# Patient Record
Sex: Male | Born: 1935
Health system: Southern US, Community
[De-identification: ages and names within clinical notes are randomized; demographics above are authoritative.]

## PROBLEM LIST (undated history)

## (undated) DIAGNOSIS — F0390 Unspecified dementia without behavioral disturbance: Secondary | ICD-10-CM

## (undated) DIAGNOSIS — I2510 Atherosclerotic heart disease of native coronary artery without angina pectoris: Secondary | ICD-10-CM

## (undated) DIAGNOSIS — R9431 Abnormal electrocardiogram [ECG] [EKG]: Secondary | ICD-10-CM

## (undated) DIAGNOSIS — E538 Deficiency of other specified B group vitamins: Secondary | ICD-10-CM

## (undated) DIAGNOSIS — I429 Cardiomyopathy, unspecified: Secondary | ICD-10-CM

## (undated) DIAGNOSIS — E119 Type 2 diabetes mellitus without complications: Secondary | ICD-10-CM

## (undated) DIAGNOSIS — I10 Essential (primary) hypertension: Secondary | ICD-10-CM

## (undated) HISTORY — PX: HIP SURGERY: SHX245

## (undated) HISTORY — PX: KNEE SURGERY: SHX244

## (undated) HISTORY — DX: Cardiomyopathy, unspecified: I42.9

## (undated) HISTORY — DX: Abnormal electrocardiogram (ECG) (EKG): R94.31

## (undated) HISTORY — DX: Essential (primary) hypertension: I10

## (undated) HISTORY — DX: Unspecified dementia, unspecified severity, without behavioral disturbance, psychotic disturbance, mood disturbance, and anxiety: F03.90

## (undated) HISTORY — PX: PROSTATE SURGERY: SHX751

## (undated) HISTORY — PX: HERNIA REPAIR: SHX51

## (undated) HISTORY — DX: Type 2 diabetes mellitus without complications: E11.9

## (undated) HISTORY — DX: Atherosclerotic heart disease of native coronary artery without angina pectoris: I25.10

---

## 2011-06-08 ENCOUNTER — Ambulatory Visit (INDEPENDENT_AMBULATORY_CARE_PROVIDER_SITE_OTHER): Payer: Medicare Other | Admitting: Urology

## 2011-06-08 DIAGNOSIS — R82998 Other abnormal findings in urine: Secondary | ICD-10-CM

## 2011-06-08 DIAGNOSIS — N320 Bladder-neck obstruction: Secondary | ICD-10-CM

## 2012-01-31 DIAGNOSIS — R498 Other voice and resonance disorders: Secondary | ICD-10-CM | POA: Diagnosis not present

## 2012-01-31 DIAGNOSIS — R229 Localized swelling, mass and lump, unspecified: Secondary | ICD-10-CM | POA: Diagnosis not present

## 2012-01-31 DIAGNOSIS — M1990 Unspecified osteoarthritis, unspecified site: Secondary | ICD-10-CM | POA: Diagnosis not present

## 2012-03-06 DIAGNOSIS — R5383 Other fatigue: Secondary | ICD-10-CM | POA: Diagnosis not present

## 2012-03-06 DIAGNOSIS — Z79899 Other long term (current) drug therapy: Secondary | ICD-10-CM | POA: Diagnosis not present

## 2012-03-06 DIAGNOSIS — IMO0002 Reserved for concepts with insufficient information to code with codable children: Secondary | ICD-10-CM | POA: Diagnosis not present

## 2012-03-06 DIAGNOSIS — E7800 Pure hypercholesterolemia, unspecified: Secondary | ICD-10-CM | POA: Diagnosis not present

## 2012-03-06 DIAGNOSIS — M7989 Other specified soft tissue disorders: Secondary | ICD-10-CM | POA: Diagnosis not present

## 2012-03-06 DIAGNOSIS — R233 Spontaneous ecchymoses: Secondary | ICD-10-CM | POA: Diagnosis not present

## 2012-03-08 DIAGNOSIS — Z0000 Encounter for general adult medical examination without abnormal findings: Secondary | ICD-10-CM | POA: Diagnosis not present

## 2012-03-08 DIAGNOSIS — E119 Type 2 diabetes mellitus without complications: Secondary | ICD-10-CM | POA: Diagnosis not present

## 2012-03-15 DIAGNOSIS — L03039 Cellulitis of unspecified toe: Secondary | ICD-10-CM | POA: Diagnosis not present

## 2012-04-04 DIAGNOSIS — M109 Gout, unspecified: Secondary | ICD-10-CM | POA: Diagnosis not present

## 2012-06-12 DIAGNOSIS — I10 Essential (primary) hypertension: Secondary | ICD-10-CM | POA: Diagnosis not present

## 2012-07-07 ENCOUNTER — Encounter (HOSPITAL_COMMUNITY): Payer: Self-pay | Admitting: Emergency Medicine

## 2012-07-07 ENCOUNTER — Observation Stay (HOSPITAL_COMMUNITY)
Admission: EM | Admit: 2012-07-07 | Discharge: 2012-07-08 | Disposition: A | Discharge status: Home / Self Care | Payer: Medicare Other | Attending: Internal Medicine | Admitting: Internal Medicine

## 2012-07-07 ENCOUNTER — Emergency Department (HOSPITAL_COMMUNITY): Payer: Medicare Other

## 2012-07-07 DIAGNOSIS — R9431 Abnormal electrocardiogram [ECG] [EKG]: Secondary | ICD-10-CM | POA: Diagnosis present

## 2012-07-07 DIAGNOSIS — I10 Essential (primary) hypertension: Principal | ICD-10-CM | POA: Diagnosis present

## 2012-07-07 DIAGNOSIS — Z23 Encounter for immunization: Secondary | ICD-10-CM | POA: Insufficient documentation

## 2012-07-07 DIAGNOSIS — I517 Cardiomegaly: Secondary | ICD-10-CM | POA: Diagnosis not present

## 2012-07-07 DIAGNOSIS — I160 Hypertensive urgency: Secondary | ICD-10-CM

## 2012-07-07 DIAGNOSIS — H01029 Squamous blepharitis unspecified eye, unspecified eyelid: Secondary | ICD-10-CM | POA: Diagnosis not present

## 2012-07-07 DIAGNOSIS — H3560 Retinal hemorrhage, unspecified eye: Secondary | ICD-10-CM | POA: Diagnosis not present

## 2012-07-07 LAB — CBC WITH DIFFERENTIAL/PLATELET
Basophils Relative: 0 % (ref 0–1)
Eosinophils Relative: 1 % (ref 0–5)
HCT: 38.6 % — ABNORMAL LOW (ref 39.0–52.0)
Hemoglobin: 13.5 g/dL (ref 13.0–17.0)
MCH: 31.4 pg (ref 26.0–34.0)
MCHC: 35 g/dL (ref 30.0–36.0)
MCV: 89.8 fL (ref 78.0–100.0)
Monocytes Absolute: 0.4 10*3/uL (ref 0.1–1.0)
Monocytes Relative: 6 % (ref 3–12)
Neutro Abs: 3.3 10*3/uL (ref 1.7–7.7)

## 2012-07-07 LAB — URINALYSIS, ROUTINE W REFLEX MICROSCOPIC
Bilirubin Urine: NEGATIVE
Ketones, ur: NEGATIVE mg/dL
Specific Gravity, Urine: 1.006 (ref 1.005–1.030)
Urobilinogen, UA: 0.2 mg/dL (ref 0.0–1.0)
pH: 6.5 (ref 5.0–8.0)

## 2012-07-07 LAB — BASIC METABOLIC PANEL
BUN: 13 mg/dL (ref 6–23)
CO2: 26 mEq/L (ref 19–32)
Chloride: 104 mEq/L (ref 96–112)
Creatinine, Ser: 0.98 mg/dL (ref 0.50–1.35)
GFR calc Af Amer: >90 mL/min (ref >90)
Glucose, Bld: 136 mg/dL — ABNORMAL HIGH (ref 70–99)
Potassium: 4.1 mEq/L (ref 3.5–5.1)

## 2012-07-07 LAB — URINE MICROSCOPIC-ADD ON

## 2012-07-07 MED ORDER — ONDANSETRON HCL 4 MG/2ML IJ SOLN: 4.0000 mg | Freq: Four times a day (QID) | INTRAMUSCULAR | Status: DC | PRN

## 2012-07-07 MED ORDER — ACETAMINOPHEN 650 MG RE SUPP: 650.0000 mg | Freq: Four times a day (QID) | RECTAL | Status: DC | PRN

## 2012-07-07 MED ORDER — HYDRALAZINE HCL 20 MG/ML IJ SOLN
10.0000 mg | Freq: Four times a day (QID) | INTRAMUSCULAR | Status: DC | PRN
  Administered 2012-07-07 – 2012-07-08 (×2): 10 mg via INTRAVENOUS
  Filled 2012-07-07: qty 1
  Filled 2012-07-07: qty 0.5

## 2012-07-07 MED ORDER — ASPIRIN 325 MG PO TABS
325.0000 mg | ORAL_TABLET | Freq: Every day | ORAL | Status: DC
  Administered 2012-07-07 – 2012-07-08 (×2): 325 mg via ORAL
  Filled 2012-07-07 (×2): qty 1

## 2012-07-07 MED ORDER — ONDANSETRON HCL 4 MG PO TABS: 4.0000 mg | ORAL_TABLET | Freq: Four times a day (QID) | ORAL | Status: DC | PRN

## 2012-07-07 MED ORDER — CYANOCOBALAMIN 500 MCG PO TABS
500.0000 ug | ORAL_TABLET | Freq: Every day | ORAL | Status: DC
  Administered 2012-07-08: 500 ug via ORAL
  Filled 2012-07-07 (×2): qty 1

## 2012-07-07 MED ORDER — AMLODIPINE BESYLATE 5 MG PO TABS
5.0000 mg | ORAL_TABLET | Freq: Every day | ORAL | Status: DC
  Administered 2012-07-07 – 2012-07-08 (×2): 5 mg via ORAL
  Filled 2012-07-07 (×2): qty 1

## 2012-07-07 MED ORDER — ENOXAPARIN SODIUM 40 MG/0.4ML ~~LOC~~ SOLN
40.0000 mg | SUBCUTANEOUS | Status: DC
  Administered 2012-07-08: 40 mg via SUBCUTANEOUS
  Filled 2012-07-07: qty 0.4

## 2012-07-07 MED ORDER — VITAMIN C 100 MG PO TABS: 100.0000 mg | ORAL_TABLET | Freq: Every day | ORAL | Status: DC

## 2012-07-07 MED ORDER — HYDRALAZINE HCL 25 MG PO TABS
25.0000 mg | ORAL_TABLET | Freq: Once | ORAL | Status: AC
  Administered 2012-07-07: 25 mg via ORAL
  Filled 2012-07-07: qty 1

## 2012-07-07 MED ORDER — VITAMIN C 250 MG PO TABS
250.0000 mg | ORAL_TABLET | Freq: Every day | ORAL | Status: DC
  Administered 2012-07-08: 250 mg via ORAL
  Filled 2012-07-07: qty 1

## 2012-07-07 MED ORDER — CLONIDINE HCL 0.1 MG PO TABS
0.1000 mg | ORAL_TABLET | Freq: Once | ORAL | Status: DC
  Filled 2012-07-07: qty 1

## 2012-07-07 MED ORDER — PNEUMOCOCCAL VAC POLYVALENT 25 MCG/0.5ML IJ INJ
0.5000 mL | INJECTION | INTRAMUSCULAR | Status: AC
  Administered 2012-07-08: 0.5 mL via INTRAMUSCULAR
  Filled 2012-07-07: qty 0.5

## 2012-07-07 MED ORDER — ACETAMINOPHEN 325 MG PO TABS: 650.0000 mg | ORAL_TABLET | Freq: Four times a day (QID) | ORAL | Status: DC | PRN

## 2012-07-07 MED ORDER — AMLODIPINE BESYLATE 10 MG PO TABS
10.0000 mg | ORAL_TABLET | Freq: Every day | ORAL | Status: DC
  Filled 2012-07-07: qty 1

## 2012-07-07 NOTE — ED Notes (Addendum)
Pt presenting to ed with c/o sent by his eye doctor for elevated blood pressure pt denies history of hypertension in triage. Pt denies chest pain, nausea and vomiting. Pt is alert and oriented at this time. Pt denies dizziness at this time

## 2012-07-07 NOTE — H&P (Addendum)
Patient's PCP: Ignatius Specking., MD  Chief Complaint: High blood pressure  History of Present Illness: Ryan [name] is a 77 y.o. Caucasian male with no past medical history who presents with the above complaints.  Patient reports that he saw his primary care physician in [place], [place] about one month ago and was told that he had high blood pressure with systolic in the 180s.  He was supposed to be started on an antihypertensive medication, however he reports that he did not get the prescription and is not sure if they're prescription was called into his pharmacy.  He had a ophthalmology appointment in [place], ophthalmology exam reveals that he had some bleeding in his right eye and when they checked his blood pressure it was elevated as a result he was brought in to the emergency department for further evaluation.  In the emergency department he was found to have blood pressure initially of 201/93 was given hydralazine.  EKG showed inverted T waves in precordial leads V3 and V4 with questionable ST depressions with no reciprocal changes.  The hospitalist service was asked to admit the patient for hypertensive emergency.  Patient denies any recent fevers, chills, nausea, vomiting, chest pain, shortness of breath, abdominal pain, diarrhea, headaches or vision changes.  Does admit to having some cataracts.  History reviewed. No pertinent past medical history. Past Surgical History  Procedure Date  . Hernia repair   . Prostate surgery   . Knee surgery   . Hip surgery    Family History  Problem Relation Age of Onset  . Heart attack Mother   . COPD Father    History   Social History  . Marital Status: Married    Spouse Name: N/A    Number of Children: N/A  . Years of Education: N/A   Occupational History  . Not on file.   Social History Main Topics  . Smoking status: Former Games developer  . Smokeless tobacco: Not on file  . Alcohol Use: No  . Drug Use: No  . Sexually Active:    Other  Topics Concern  . Not on file   Social History Narrative  . No narrative on file   Allergies: Review of patient's allergies indicates no known allergies.  Home Meds: Prior to Admission medications   Medication Sig Start Date End Date Taking? Authorizing Provider  Ascorbic Acid (VITAMIN C) 100 MG tablet Take 100 mg by mouth daily.   Yes Historical Provider, MD  cyanocobalamin 500 MCG tablet Take 500 mcg by mouth daily.   Yes Historical Provider, MD    Review of Systems: All systems reviewed with the patient and positive as per history of present illness, otherwise all other systems are negative.  Physical Exam: Blood pressure 178/84, pulse 72, temperature 98 F (36.7 C), temperature source Oral, resp. rate 20, SpO2 99.00%. General: Awake, Oriented x3, No acute distress. HEENT: EOMI, Moist mucous membranes, on ophthalmoscope was not able to find any sources of bleeding in the right eye. Neck: Supple, no JVD. CV: S1 and S2 Lungs: Clear to ascultation bilaterally Abdomen: Soft, Nontender, Nondistended, +bowel sounds. Ext: Good pulses. Trace edema. No clubbing or cyanosis noted.  Left fifth toe amputation from an accident previously. Neuro: Cranial Nerves II-XII grossly intact. Has 5/5 motor strength in upper and lower extremities.  Lab results:  [facility] 07/07/12 1706  NA 140  K 4.1  CL 104  CO2 26  GLUCOSE 136*  BUN 13  CREATININE 0.98  CALCIUM 9.5  MG --  PHOS --   No results found for this basename: AST:2,ALT:2,ALKPHOS:2,BILITOT:2,PROT:2,ALBUMIN:2 in the last 72 hours No results found for this basename: LIPASE:2,AMYLASE:2 in the last 72 hours  Basename 07/07/12 1706  WBC 6.1  NEUTROABS 3.3  HGB 13.5  HCT 38.6*  MCV 89.8  PLT 135*    Basename 07/07/12 1706  CKTOTAL --  CKMB --  CKMBINDEX --  TROPONINI <0.30   No components found with this basename: POCBNP:3 No results found for this basename: DDIMER in the last 72 hours No results found for this  basename: HGBA1C:2 in the last 72 hours No results found for this basename: CHOL:2,HDL:2,LDLCALC:2,TRIG:2,CHOLHDL:2,LDLDIRECT:2 in the last 72 hours No results found for this basename: TSH,T4TOTAL,FREET3,T3FREE,THYROIDAB in the last 72 hours No results found for this basename: VITAMINB12:2,FOLATE:2,FERRITIN:2,TIBC:2,IRON:2,RETICCTPCT:2 in the last 72 hours Imaging results:  Dg Chest Port 1 View  07/07/2012  *RADIOLOGY REPORT*  Clinical Data: Hypertension and cardiomegaly  PORTABLE CHEST - 1 VIEW  Comparison: None  Findings: There is mild cardiac enlargement.  There is no pleural effusion or edema.  There is no airspace consolidation.  IMPRESSION:  1.  No acute cardiopulmonary abnormalities. 2.  Mild cardiac enlargement.   Original Report Authenticated By: Rosealee Albee, M.D.    Other results: EKG: Sinus rhythm with inverted T waves in leads V3, V4, ?ST depressions in leads V3 and V4 (no reciprocal changes noted), suspect ST depression likely due to inverted T waves, no prior EKGs for comparison, HR 70s, PR 184 ms, QTc 475 ms.  Assessment & Plan by Problem: Malignant hypertension/hypertensive emergency Urine analysis does not show proteinuria, however given ophthalmology evaluation today showed small hemorrhage in the right eye (not visible on my examination), which is likely caused by from uncontrolled hypertension.  Patient has end organ damage caused by uncontrolled hypertension.  Continue when necessary hydralazine for systolic blood pressure greater than 165.  Start the patient on amlodipine 5 mg daily.  Further titration depending on patient's clinical course.  Abnormal EKG Patient had inverted T waves in leads V3 and V4 with questionable ST depressions in the same leads suspect is likely caused by the inverted T waves rather than true ST depression changes.  Patient has no reciprocal changes to suggest active ischemia.  Initial troponin negative.  Patient does not have JVD on exam or lower  extremity edema to suggest heart failure.  Patient will likely benefit from outpatient echocardiogram, this was discussed with the patient.  If however the patient has EKG changes or chest pain may consider getting echocardiogram as inpatient.  Patient started on aspirin 325 mg daily, may consider transitioning to 81 mg daily at discharge.  Prophylaxis Lovenox.  CODE STATUS Patient indicated that he does wish to be shocked and had chest compressions done if needed.  He does not wish to be on mechanical ventilation even temporarily.  He indicated that he is currently living by himself and has no family; and if it is his time to go then he is ready to go.  Disposition Admit the patient to telemetry as observation.  If troponins are negative and the blood pressure is improved consider discharge with followup with primary care physician in 1-2 weeks.  Time spent on admission, talking to the patient, and coordinating care was: 60 mins.  Magen Suriano A, MD 07/07/2012, 7:31 PM

## 2012-07-07 NOTE — Progress Notes (Signed)
ED CM noted no pcp listed Pt can not recall the name of his provider in [place]

## 2012-07-07 NOTE — ED Provider Notes (Addendum)
History     CSN: [id number]  Arrival date & time 07/07/12  1518   First MD Initiated Contact with Patient 07/07/12 1612      Chief Complaint  Patient presents with  . Hypertension    (Consider location/radiation/quality/duration/timing/severity/associated sxs/prior treatment) HPI Comments: Pt comes in with cc of HTN. Pt has no hx of HTN, and reports that he went to an eye doctor today, and he was noted to have high BP and sent to the ER. Pt denies any chest pain, sob, nausea, headaches, dizziness, visual complains that are new. He has never been on any antihypertensives. Not taking any stimulants.   Patient is a 77 y.o. male presenting with hypertension. The history is provided by the patient.  Hypertension Pertinent negatives include no chest pain, no abdominal pain and no shortness of breath.    History reviewed. No pertinent past medical history.  Past Surgical History  Procedure Date  . Hernia repair   . Prostate surgery   . Knee surgery   . Hip surgery     No family history on file.  History  Substance Use Topics  . Smoking status: Former Games developer  . Smokeless tobacco: Not on file  . Alcohol Use: No      Review of Systems  Constitutional: Negative for activity change and appetite change.  Respiratory: Negative for cough and shortness of breath.   Cardiovascular: Negative for chest pain.  Gastrointestinal: Negative for abdominal pain.  Genitourinary: Negative for dysuria.    Allergies  Review of patient's allergies indicates no known allergies.  Home Medications   Current Outpatient Rx  Name Route Sig Dispense Refill  . VITAMIN C 100 MG PO TABS Oral Take 100 mg by mouth daily.    . CYANOCOBALAMIN 500 MCG PO TABS Oral Take 500 mcg by mouth daily.      BP 201/93  Pulse 74  Temp 98 F (36.7 C) (Oral)  Resp 22  SpO2 100%  Physical Exam  Nursing note and vitals reviewed. Constitutional: He is oriented to person, place, and time. He appears  well-developed.  HENT:  Head: Normocephalic and atraumatic.  Eyes: Conjunctivae normal and EOM are normal. Pupils are equal, round, and reactive to light.  Neck: Normal range of motion. Neck supple. No JVD present.  Cardiovascular: Normal rate, regular rhythm and normal heart sounds.   No murmur heard. Pulmonary/Chest: Effort normal and breath sounds normal. No respiratory distress. He has no wheezes.  Abdominal: Soft. Bowel sounds are normal. He exhibits no distension. There is no tenderness. There is no rebound and no guarding.  Neurological: He is alert and oriented to person, place, and time.  Skin: Skin is warm.    ED Course  Procedures (including critical care time)  Labs Reviewed  CBC WITH DIFFERENTIAL - Abnormal; Notable for the following:    HCT 38.6 (*)     Platelets 135 (*)     All other components within normal limits  BASIC METABOLIC PANEL   No results found.   No diagnosis found.    MDM  PT comes in with cc of HTN, asymptomatic. BP at triage noted to be 180/95, 200/90, and during my evaluation it was 190/120. Pt has no symptoms associated with the elevated BP. We will give some po meds for now and order labs to evaluate for end organ damage.  5:35 PM  Date: 07/07/2012  Rate: 71  Rhythm: normal sinus rhythm  QRS Axis: normal  Intervals: normal  ST/T Wave  abnormalities: nonspecific ST/T changes  Conduction Disutrbances:none  Narrative Interpretation:   Old EKG Reviewed: none available PATIENT HAS T WAVE INVERSIONS/FLATTENING IN THE INFERIOR LEADS AND V3-V6 AS WELL. EKG SHOWS ST DEPRESSION > 1 MM IN THE LATERAL LEADS.  Will repeat the EKG, but the findings are concerning for some ischemic process, and he will likely benefit from tele admission and cardiac testing - including echo.   Derwood Kaplan, MD 07/07/12 1738   Date: 07/07/2012  Rate: 70  Rhythm: normal sinus rhythm  QRS Axis: normal  Intervals: normal  ST/T Wave abnormalities: nonspecific  ST/T changes  Conduction Disutrbances:none  Narrative Interpretation:   Old EKG Reviewed: unchanged    Derwood Kaplan, MD 07/07/12 1837

## 2012-07-08 DIAGNOSIS — I10 Essential (primary) hypertension: Secondary | ICD-10-CM | POA: Diagnosis present

## 2012-07-08 DIAGNOSIS — R9431 Abnormal electrocardiogram [ECG] [EKG]: Secondary | ICD-10-CM | POA: Diagnosis not present

## 2012-07-08 LAB — CBC
HCT: 41.5 % (ref 39.0–52.0)
Hemoglobin: 14.4 g/dL (ref 13.0–17.0)
MCH: 31.4 pg (ref 26.0–34.0)
MCHC: 34.7 g/dL (ref 30.0–36.0)

## 2012-07-08 LAB — TROPONIN I: Troponin I: <0.3 ng/mL (ref <0.30)

## 2012-07-08 LAB — LIPID PANEL
Cholesterol: 150 mg/dL (ref 0–200)
Total CHOL/HDL Ratio: 3 RATIO
Triglycerides: 111 mg/dL (ref <150)

## 2012-07-08 LAB — BASIC METABOLIC PANEL
BUN: 11 mg/dL (ref 6–23)
GFR calc non Af Amer: 80 mL/min — ABNORMAL LOW (ref >90)
Glucose, Bld: 129 mg/dL — ABNORMAL HIGH (ref 70–99)
Potassium: 3.6 mEq/L (ref 3.5–5.1)

## 2012-07-08 MED ORDER — AMLODIPINE BESYLATE 5 MG PO TABS: 10.0000 mg | ORAL_TABLET | Freq: Every day | ORAL | Status: DC

## 2012-07-08 MED ORDER — ASPIRIN EC 81 MG PO TBEC: 81.0000 mg | DELAYED_RELEASE_TABLET | Freq: Every day | ORAL | Status: DC

## 2012-07-08 NOTE — Progress Notes (Signed)
Initial review for observation status is complete. 

## 2012-07-08 NOTE — Discharge Summary (Signed)
Physician Discharge Summary  Patient ID: DEMARLO RIOJAS MRN: [id number] DOB/AGE: [birth year] 77 y.o.  Admit date: 07/07/2012 Discharge date: 07/08/2012  PCP: Ignatius Specking., MD  DISCHARGE DIAGNOSES:  Principal Problem:  *Hypertension, malignant Active Problems:  EKG, abnormal  Accelerated hypertension   RECOMMENDATIONS TO PCP: 1. Needs follow up for BP management 2. Needs follow up for abnormal EKG. Will need ECHOCARDIOGRAM  DISCHARGE CONDITION: fair  INITIAL HISTORY: Ryan [name] is a 77 y.o. Caucasian male with no past medical history who presents with the above complaints. Patient reports that he saw his primary care physician in [place], [place] about one month ago and was told that he had high blood pressure with systolic in the 180s. He was supposed to be started on an antihypertensive medication, however he reports that he did not get the prescription and is not sure if they're prescription was called into his pharmacy. He had a ophthalmology appointment in [place], ophthalmology exam reveals that he had some bleeding in his right eye and when they checked his blood pressure it was elevated as a result he was brought in to the emergency department for further evaluation. In the emergency department he was found to have blood pressure initially of 201/93 was given hydralazine. EKG showed inverted T waves in precordial leads V3 and V4 with questionable ST depressions with no reciprocal changes. The hospitalist service was asked to admit the patient for hypertensive emergency. Patient denied any recent fevers, chills, nausea, vomiting, chest pain, shortness of breath, abdominal pain, diarrhea, headaches or vision changes.   HOSPITAL COURSE:   Accelerated Hypertension Patient was given Amlodipine last night. BP is better. Will increase to 10mg . He remains asymptomatic. He should follow up with his PCP for further management of same.   Abnormal EKG  Patient had inverted T  waves in leads V3 and V4 with questionable ST depressions in the same leads suspect is likely caused by the inverted T waves rather than true ST depression changes. This probably from LV strain from high BP. Serial EKG's showed similar findings. He did not have any chest pain. Troponins have been normal. He can get further work up as outpatient. He will benefit from an echocardiogram. He will be discharged on Aspirin for now.    Patient remains stable. BP is better but will likely take a few weeks to show significant improvement as his BP has presumably been high for many months. He is ok for discharge.   IMAGING STUDIES Dg Chest Port 1 View  07/07/2012  *RADIOLOGY REPORT*  Clinical Data: Hypertension and cardiomegaly  PORTABLE CHEST - 1 VIEW  Comparison: None  Findings: There is mild cardiac enlargement.  There is no pleural effusion or edema.  There is no airspace consolidation.  IMPRESSION:  1.  No acute cardiopulmonary abnormalities. 2.  Mild cardiac enlargement.   Original Report Authenticated By: Rosealee Albee, M.D.     DISCHARGE EXAMINATION: Blood pressure 143/70, pulse 75, temperature 97.4 F (36.3 C), temperature source Oral, resp. rate 20, height 6\' 3"  (1.905 m), weight 82.6 kg (182 lb 1.6 oz), SpO2 99.00%. General appearance: alert, cooperative, appears stated age and no distress Resp: clear to auscultation bilaterally Cardio: regular rate and rhythm, S1, S2 normal, no murmur, click, rub or gallop GI: soft, non-tender; bowel sounds normal; no masses,  no organomegaly Extremities: extremities normal, atraumatic, no cyanosis or edema Neurologic: Alert. No focal deficits. Gait was normal.  DISPOSITION: Home  Discharge Orders    Future Orders  Please Complete By Expires   Diet - low sodium heart healthy      Increase activity slowly      Discharge instructions      Comments:   Be sure to follow up with your PCP on Monday or Tuesday for further management of high blood pressure  and abnormal EKG changes. You may need to have an echocardiogram.     Current Discharge Medication List    START taking these medications   Details  amLODipine (NORVASC) 5 MG tablet Take 2 tablets (10 mg total) by mouth at bedtime. Qty: 30 tablet, Refills: 1    aspirin EC 81 MG tablet Take 1 tablet (81 mg total) by mouth daily. Qty: 30 tablet, Refills: 1      CONTINUE these medications which have NOT CHANGED   Details  Ascorbic Acid (VITAMIN C) 100 MG tablet Take 100 mg by mouth daily.    cyanocobalamin 500 MCG tablet Take 500 mcg by mouth daily.       Follow-up Information    Follow up with VYAS,DHRUV B., MD. Schedule an appointment as soon as possible for a visit in 2 days. (post hospitalization follow up)    Contact information:   [address] [place] [address] [phone]          TOTAL DISCHARGE TIME: 35 mins  [facility]  [facility] Pager [phone]  07/08/2012, 11:00 AM

## 2012-07-10 DIAGNOSIS — I10 Essential (primary) hypertension: Secondary | ICD-10-CM | POA: Diagnosis not present

## 2012-07-10 DIAGNOSIS — R9431 Abnormal electrocardiogram [ECG] [EKG]: Secondary | ICD-10-CM | POA: Diagnosis not present

## 2012-07-17 DIAGNOSIS — R9431 Abnormal electrocardiogram [ECG] [EKG]: Secondary | ICD-10-CM | POA: Diagnosis not present

## 2012-07-18 DIAGNOSIS — I080 Rheumatic disorders of both mitral and aortic valves: Secondary | ICD-10-CM | POA: Diagnosis not present

## 2012-07-26 DIAGNOSIS — R0989 Other specified symptoms and signs involving the circulatory and respiratory systems: Secondary | ICD-10-CM | POA: Diagnosis not present

## 2012-07-26 DIAGNOSIS — R0609 Other forms of dyspnea: Secondary | ICD-10-CM | POA: Diagnosis not present

## 2012-07-26 DIAGNOSIS — E119 Type 2 diabetes mellitus without complications: Secondary | ICD-10-CM | POA: Diagnosis not present

## 2012-08-07 ENCOUNTER — Encounter: Payer: Self-pay | Admitting: Cardiology

## 2012-08-08 ENCOUNTER — Encounter: Payer: Self-pay | Admitting: Cardiology

## 2012-08-08 ENCOUNTER — Encounter: Payer: Self-pay | Admitting: *Deleted

## 2012-08-08 ENCOUNTER — Ambulatory Visit (INDEPENDENT_AMBULATORY_CARE_PROVIDER_SITE_OTHER): Payer: Medicare Other | Admitting: Cardiology

## 2012-08-08 VITALS — BP 148/74 | HR 67 | Ht 75.0 in | Wt 188.0 lb

## 2012-08-08 DIAGNOSIS — R0609 Other forms of dyspnea: Secondary | ICD-10-CM | POA: Diagnosis not present

## 2012-08-08 DIAGNOSIS — R0989 Other specified symptoms and signs involving the circulatory and respiratory systems: Secondary | ICD-10-CM

## 2012-08-08 DIAGNOSIS — E119 Type 2 diabetes mellitus without complications: Secondary | ICD-10-CM

## 2012-08-08 DIAGNOSIS — R0602 Shortness of breath: Secondary | ICD-10-CM | POA: Insufficient documentation

## 2012-08-08 DIAGNOSIS — I10 Essential (primary) hypertension: Secondary | ICD-10-CM | POA: Diagnosis not present

## 2012-08-08 DIAGNOSIS — R0600 Dyspnea, unspecified: Secondary | ICD-10-CM

## 2012-08-08 NOTE — Assessment & Plan Note (Signed)
Seems to be intermittently exertional depending on level of activity, although in general Ryan [name] minimizes the symptoms on discussion today. Also reports some lower thoracic and back discomfort at times, not necessarily related to shortness of breath. He does have risk factors for ischemic heart disease including hypertension that has not been treated until recently, type 2 diabetes mellitus, age and gender. Also noted to have abnormal ECG at baseline although ST segment changes may be related to repolarization abnormalities. He had negative cardiac markers during hospitalization in October. Echocardiogram shows LVEF 50-55% with reportedly normal diastolic function and no wall motion abnormalities. We reviewed the situation today including his most recent testing and medications. Plan is to proceed with a Lexiscan Myoview for further ischemic evaluation. We willl inform him of the results.

## 2012-08-08 NOTE — Assessment & Plan Note (Signed)
Noted in October, now on medical therapy. Patient reports compliance. Blood pressure is much better today, although still not optimal. He is following with Dr. Sherril Croon. Recent lab work shows normal renal function.

## 2012-08-08 NOTE — Progress Notes (Signed)
Clinical Summary Ryan [name] is a 77 y.o.male referred for cardiology consultation by Dr. Sherril Croon. The patient was not entirely clear about why he was referred. Records indicate that he has mentioned exertional shortness of breath and occasional feeling of discomfort in his lower thoracic and back area. In speaking with him today, he did not indicate that these were particularly bothersome symptoms, tended to downplay them in general. He states that he might notice mild shortness of breath when he walks to his mailbox, at least 50 yards.  Record review finds hospital admission back in October with accelerated hypertension complicated by noncompliance with medical therapy. He ruled out for myocardial infarction during hospital stay during which time ECG was found to be abnormal showing fairly diffuse ST segment depression, possibly repolarization abnormalities. He ultimately underwent an echocardiogram through [name] on 11/4 that reports LVEF 50-55% with normal diastolic function, mild mitral regurgitation, mild aortic regurgitation, mildly dilated aortic root, no pericardial effusion. RVSP was calculated at 43 mm mercury.  Lab work from October reviewed finding BUN 11, creatinine 0.9, potassium 3.6, LDL 78, HDL 50, hemoglobin 14.4, platelets 124, troponin I levels normal x3. Chest x-ray did not demonstrate pulmonary edema.  He has had no previous ischemic testing based on discussion. Record review finds no previous cardiac ischemic testing.   No Known Allergies  Current Outpatient Prescriptions  Medication Sig Dispense Refill  . amLODipine (NORVASC) 5 MG tablet Take 2 tablets (10 mg total) by mouth at bedtime.  30 tablet  1  . Ascorbic Acid (VITAMIN C) 1000 MG tablet Take 1,000 mg by mouth daily.      Marland Kitchen aspirin EC 81 MG tablet Take 1 tablet (81 mg total) by mouth daily.  30 tablet  1  . cyanocobalamin 500 MCG tablet Take 500 mcg by mouth daily.      . diclofenac (CATAFLAM) 50 MG  tablet Take 50 mg by mouth as needed.      . Garlic 1000 MG CAPS Take 1,000 mg by mouth every other day.      . lisinopril (PRINIVIL,ZESTRIL) 10 MG tablet Take 10 mg by mouth daily.      . metFORMIN (GLUCOPHAGE) 500 MG tablet Take 500 mg by mouth daily with breakfast.        Past Medical History  Diagnosis Date  . Accelerated hypertension      Hospital admission 10/13  . Abnormal ECG   . Type 2 diabetes mellitus     Past Surgical History  Procedure Date  . Hernia repair   . Prostate surgery   . Knee surgery   . Hip surgery     Family History  Problem Relation Age of Onset  . Heart attack Mother   . COPD Father     Social History Ryan [name] reports that he has quit smoking. His smoking use included Cigarettes. He does not have any smokeless tobacco history on file. Ryan [name] reports that he does not drink alcohol.  Review of Systems No palpitations. Reports chronic left hip pain following a remote fracture, uses a cane to steady himself. He denies any falls. No orthopnea or PND. States he's been having trouble with sleep, also some memory problems. Otherwise negative.  Physical Examination Filed Vitals:   08/08/12 1010  BP: 148/74  Pulse: 67   Filed Weights   08/08/12 1010  Weight: 188 lb (85.276 kg)   No acute distress. HEENT: Conjunctiva and lids normal, oropharynx clear. Neck: Supple, no elevated JVP  or carotid bruits, no thyromegaly. Lungs: Clear to auscultation, nonlabored breathing at rest. Cardiac: Regular rate and rhythm, no S3, soft systolic murmur at apex, no diastolic murmur, no pericardial rub. Abdomen: Soft, nontender, bowel sounds present. Extremities: No pitting edema, distal pulses 2+. Skin: Warm and dry. Musculoskeletal: No kyphosis. Neuropsychiatric: Alert and oriented x3, affect grossly appropriate. Somewhat tangential historian.   Problem List and Plan   Shortness of breath Seems to be intermittently exertional depending on level  of activity, although in general Ryan [name] minimizes the symptoms on discussion today. Also reports some lower thoracic and back discomfort at times, not necessarily related to shortness of breath. He does have risk factors for ischemic heart disease including hypertension that has not been treated until recently, type 2 diabetes mellitus, age and gender. Also noted to have abnormal ECG at baseline although ST segment changes may be related to repolarization abnormalities. He had negative cardiac markers during hospitalization in October. Echocardiogram shows LVEF 50-55% with reportedly normal diastolic function and no wall motion abnormalities. We reviewed the situation today including his most recent testing and medications. Plan is to proceed with a Lexiscan Myoview for further ischemic evaluation. We willl inform him of the results.  Accelerated hypertension Noted in October, now on medical therapy. Patient reports compliance. Blood pressure is much better today, although still not optimal. He is following with Dr. Sherril Croon. Recent lab work shows normal renal function.  Type 2 diabetes mellitus On metformin, followed by Dr. Sherril Croon.    Jonelle Sidle, M.D., F.A.C.C.

## 2012-08-08 NOTE — Assessment & Plan Note (Signed)
On metformin, followed by Dr. Sherril Croon.

## 2012-08-08 NOTE — Patient Instructions (Addendum)
Your physician recommends that you continue on your current medications as directed. Please refer to the Current Medication list given to you today. Your physician has requested that you have a lexiscan myoview. For further information please visit [email or web address]. Please follow instruction sheet, as given. We will call you with your results. 

## 2012-08-21 ENCOUNTER — Encounter (HOSPITAL_COMMUNITY)
Admission: RE | Admit: 2012-08-21 | Discharge: 2012-08-21 | Disposition: A | Discharge status: Home / Self Care | Payer: Medicare Other | Source: Ambulatory Visit | Attending: Cardiology | Admitting: Cardiology

## 2012-08-21 ENCOUNTER — Encounter (HOSPITAL_COMMUNITY): Payer: Self-pay

## 2012-08-21 ENCOUNTER — Encounter (HOSPITAL_COMMUNITY): Payer: Self-pay | Admitting: Cardiology

## 2012-08-21 ENCOUNTER — Ambulatory Visit (HOSPITAL_COMMUNITY)
Admission: RE | Admit: 2012-08-21 | Discharge: 2012-08-21 | Disposition: A | Discharge status: Home / Self Care | Payer: Medicare Other | Source: Ambulatory Visit | Attending: Cardiology | Admitting: Cardiology

## 2012-08-21 DIAGNOSIS — I10 Essential (primary) hypertension: Secondary | ICD-10-CM | POA: Diagnosis not present

## 2012-08-21 DIAGNOSIS — R9439 Abnormal result of other cardiovascular function study: Secondary | ICD-10-CM | POA: Insufficient documentation

## 2012-08-21 DIAGNOSIS — E119 Type 2 diabetes mellitus without complications: Secondary | ICD-10-CM | POA: Insufficient documentation

## 2012-08-21 DIAGNOSIS — R0609 Other forms of dyspnea: Secondary | ICD-10-CM | POA: Insufficient documentation

## 2012-08-21 DIAGNOSIS — R9431 Abnormal electrocardiogram [ECG] [EKG]: Secondary | ICD-10-CM | POA: Insufficient documentation

## 2012-08-21 DIAGNOSIS — R0602 Shortness of breath: Secondary | ICD-10-CM

## 2012-08-21 DIAGNOSIS — R0600 Dyspnea, unspecified: Secondary | ICD-10-CM

## 2012-08-21 DIAGNOSIS — R0989 Other specified symptoms and signs involving the circulatory and respiratory systems: Secondary | ICD-10-CM | POA: Insufficient documentation

## 2012-08-21 MED ORDER — REGADENOSON 0.4 MG/5ML IV SOLN
INTRAVENOUS | Status: AC
  Administered 2012-08-21: 0.4 mg via INTRAVENOUS
  Filled 2012-08-21: qty 5

## 2012-08-21 MED ORDER — SODIUM CHLORIDE 0.9 % IJ SOLN
INTRAMUSCULAR | Status: AC
  Administered 2012-08-21: 10 mL via INTRAVENOUS
  Filled 2012-08-21: qty 10

## 2012-08-21 MED ORDER — TECHNETIUM TC 99M SESTAMIBI - CARDIOLITE
30.0000 | Freq: Once | INTRAVENOUS | Status: AC | PRN
  Administered 2012-08-21: 12:00:00 30 via INTRAVENOUS

## 2012-08-21 MED ORDER — TECHNETIUM TC 99M SESTAMIBI GENERIC - CARDIOLITE
10.0000 | Freq: Once | INTRAVENOUS | Status: AC | PRN
  Administered 2012-08-21: 10 via INTRAVENOUS

## 2012-08-21 NOTE — Progress Notes (Signed)
Stress Lab Nurses Notes - [facility]  Marlan Steward [facility] 08/21/2012 Reason for doing test: Dyspnea Type of test: Marlane Hatcher Nurse performing test: Parke Poisson, RN Nuclear Medicine Tech: Lou Cal Echo Tech: Not Applicable MD performing test: Ival Bible & Joni Reining NP Family MD: Vyas Test explained and consent signed: yes IV started: 22g jelco, Saline lock flushed, No redness or edema and Saline lock started in radiology Symptoms: dizziness Treatment/Intervention: None Reason test stopped: protocol completed After recovery IV was: Discontinued via X-ray tech and No redness or edema Patient to return to Nuc. Med at : 12:45 Patient discharged: Home Patient's Condition upon discharge was: stable Comments: During test BP 117/64 & HR 82.  Recovery BP 124/66 & HR 76.  Symptoms resolved in recovery. Erskine Speed T

## 2012-08-25 ENCOUNTER — Encounter: Payer: Self-pay | Admitting: *Deleted

## 2012-08-25 ENCOUNTER — Ambulatory Visit (INDEPENDENT_AMBULATORY_CARE_PROVIDER_SITE_OTHER): Payer: Medicare Other | Admitting: *Deleted

## 2012-08-25 VITALS — BP 122/66 | HR 71 | Ht 75.25 in | Wt 188.0 lb

## 2012-08-25 DIAGNOSIS — I10 Essential (primary) hypertension: Secondary | ICD-10-CM

## 2012-08-25 NOTE — Progress Notes (Signed)
Patient came into office to have medications reviewed per [place] office request Ryan [name]). Patient's medications were reconciled. Patient states he has been taking his medications without any missed doses. Patient asked nurse about which times he should take his medications. Nurse informed patient that he could choose the time of day as long as he stuck with the same time everyday. Patient denies any side effects from medications. Patient denies chest pain, dizziness or shortness of breath.

## 2012-08-28 ENCOUNTER — Ambulatory Visit: Payer: Medicare Other | Admitting: Adult Health

## 2012-09-08 ENCOUNTER — Ambulatory Visit: Payer: Medicare Other | Admitting: Cardiology

## 2012-10-16 ENCOUNTER — Ambulatory Visit (INDEPENDENT_AMBULATORY_CARE_PROVIDER_SITE_OTHER): Payer: Medicare Other | Admitting: Cardiology

## 2012-10-16 ENCOUNTER — Encounter: Payer: Self-pay | Admitting: Cardiology

## 2012-10-16 VITALS — BP 128/60 | HR 59 | Wt 181.0 lb

## 2012-10-16 DIAGNOSIS — I2510 Atherosclerotic heart disease of native coronary artery without angina pectoris: Secondary | ICD-10-CM

## 2012-10-16 DIAGNOSIS — I10 Essential (primary) hypertension: Secondary | ICD-10-CM | POA: Diagnosis not present

## 2012-10-16 DIAGNOSIS — F0390 Unspecified dementia without behavioral disturbance: Secondary | ICD-10-CM | POA: Insufficient documentation

## 2012-10-16 DIAGNOSIS — E119 Type 2 diabetes mellitus without complications: Secondary | ICD-10-CM | POA: Diagnosis not present

## 2012-10-16 DIAGNOSIS — I160 Hypertensive urgency: Secondary | ICD-10-CM | POA: Insufficient documentation

## 2012-10-16 NOTE — Assessment & Plan Note (Signed)
Followed by Dr. Vyas 

## 2012-10-16 NOTE — Assessment & Plan Note (Signed)
Possible diagnosis, at least significant problems with memory seems to be the case. He is followed by Dr. Sherril Croon.

## 2012-10-16 NOTE — Assessment & Plan Note (Signed)
Blood pressure is much better controlled overall. Continue current regimen.

## 2012-10-16 NOTE — Progress Notes (Signed)
Clinical Summary [name] is a 78 y.o.male presenting for followup. He was previously seen in the [place] office back in November 2013. He reports no definite angina symptoms, although intermittent dyspnea on exertion which has been stable, overall NYHA class II.  Myoview in December 2013 was abnormal but low risk showing nondiagnostic ST segment changes, inferior apical ischemia, LVEF 65%. Medical therapy was recommended. I reviewed this with him again today. We discussed the importance of medical therapy and followup.  [name] seems to have long-term, possibly progressing, problems with his memory. Cannot recall details of previous discussions we have had, or for that matter why he came in for the visit today. He does seem to be taking his medications however, blood pressure is well controlled. He did have to stop the ACE inhibitor related to cough.  Today I showed him the images from his Myoview, also discussed probability of underlying CAD, plan for medical therapy at this time.   Allergies  Allergen Reactions  . Lisinopril Cough    Current Outpatient Prescriptions  Medication Sig Dispense Refill  . amLODipine (NORVASC) 10 MG tablet Take 10 mg by mouth daily.      . Ascorbic Acid (VITAMIN C) 1000 MG tablet Take 1,000 mg by mouth daily.      Marland Kitchen aspirin EC 81 MG tablet Take 162 mg by mouth daily.      . cyanocobalamin 500 MCG tablet Take 500 mcg by mouth daily.      . Garlic 1000 MG CAPS Take 1,000 mg by mouth every other day.      . metFORMIN (GLUCOPHAGE) 500 MG tablet Take 500 mg by mouth daily with breakfast.        Past Medical History  Diagnosis Date  . Essential hypertension, benign      Hospital admission 10/13  . Abnormal ECG   . Type 2 diabetes mellitus   . Coronary atherosclerosis of native coronary artery     Based on Myoview - managing medically  . Dementia     Possible    Social History [name] reports that he has quit smoking. His smoking use  included Cigarettes. He does not have any smokeless tobacco history on file. [name] reports that he does not drink alcohol.  Review of Systems   Physical Examination Filed Vitals:   10/16/12 0845  BP: 128/60  Pulse: 59   Filed Weights   10/16/12 0845  Weight: 181 lb (82.101 kg)    No acute distress.  HEENT: Conjunctiva and lids normal, oropharynx clear.  Neck: Supple, no elevated JVP or carotid bruits, no thyromegaly.  Lungs: Clear to auscultation, nonlabored breathing at rest.  Cardiac: Regular rate and rhythm, no S3, soft systolic murmur at apex, no diastolic murmur, no pericardial rub.  Abdomen: Soft, nontender, bowel sounds present.  Extremities: No pitting edema, distal pulses 2+.  Skin: Warm and dry.  Musculoskeletal: No kyphosis.  Neuropsychiatric: Alert and oriented x3, affect grossly appropriate. Somewhat tangential historian.   Problem List and Plan   Coronary atherosclerosis of native coronary artery Based on abnormal Myoview, overall low risk with area of inferior apical ischemia. I discussed this with him again today and showed him the images. Plan to continue medical therapy at this point.  Essential hypertension, benign Blood pressure is much better controlled overall. Continue current regimen.  Type 2 diabetes mellitus Followed by Dr. Sherril [name].  Dementia Possible diagnosis, at least significant problems with memory seems to be the case. He is  followed by Dr. Sherril [name].    Ryan [name], M.D., F.A.C.C.

## 2012-10-16 NOTE — Assessment & Plan Note (Signed)
Based on abnormal Myoview, overall low risk with area of inferior apical ischemia. I discussed this with him again today and showed him the images. Plan to continue medical therapy at this point.

## 2012-10-16 NOTE — Patient Instructions (Addendum)
Your physician recommends that you schedule a follow-up appointment in: 6 months  

## 2012-10-17 ENCOUNTER — Ambulatory Visit: Payer: Medicare Other | Admitting: Cardiology

## 2013-05-22 DIAGNOSIS — H2510 Age-related nuclear cataract, unspecified eye: Secondary | ICD-10-CM | POA: Diagnosis not present

## 2013-07-19 ENCOUNTER — Other Ambulatory Visit: Payer: Self-pay

## 2013-07-19 DIAGNOSIS — M19049 Primary osteoarthritis, unspecified hand: Secondary | ICD-10-CM | POA: Diagnosis not present

## 2014-01-29 ENCOUNTER — Emergency Department (HOSPITAL_COMMUNITY): Payer: Medicare Other

## 2014-01-29 ENCOUNTER — Inpatient Hospital Stay (HOSPITAL_COMMUNITY)
Admission: EM | Admit: 2014-01-29 | Discharge: 2014-02-01 | DRG: 684 | Disposition: A | Discharge status: Home Health | Payer: Medicare Other | Attending: Internal Medicine | Admitting: Internal Medicine

## 2014-01-29 ENCOUNTER — Encounter (HOSPITAL_COMMUNITY): Payer: Self-pay | Admitting: Emergency Medicine

## 2014-01-29 DIAGNOSIS — R197 Diarrhea, unspecified: Secondary | ICD-10-CM | POA: Diagnosis not present

## 2014-01-29 DIAGNOSIS — E860 Dehydration: Secondary | ICD-10-CM | POA: Diagnosis not present

## 2014-01-29 DIAGNOSIS — F0390 Unspecified dementia without behavioral disturbance: Secondary | ICD-10-CM | POA: Diagnosis present

## 2014-01-29 DIAGNOSIS — E119 Type 2 diabetes mellitus without complications: Secondary | ICD-10-CM | POA: Diagnosis present

## 2014-01-29 DIAGNOSIS — J4489 Other specified chronic obstructive pulmonary disease: Secondary | ICD-10-CM | POA: Diagnosis present

## 2014-01-29 DIAGNOSIS — N179 Acute kidney failure, unspecified: Principal | ICD-10-CM | POA: Diagnosis present

## 2014-01-29 DIAGNOSIS — J449 Chronic obstructive pulmonary disease, unspecified: Secondary | ICD-10-CM | POA: Diagnosis not present

## 2014-01-29 DIAGNOSIS — I2510 Atherosclerotic heart disease of native coronary artery without angina pectoris: Secondary | ICD-10-CM | POA: Diagnosis present

## 2014-01-29 DIAGNOSIS — A088 Other specified intestinal infections: Secondary | ICD-10-CM | POA: Diagnosis present

## 2014-01-29 DIAGNOSIS — R112 Nausea with vomiting, unspecified: Secondary | ICD-10-CM | POA: Diagnosis not present

## 2014-01-29 DIAGNOSIS — Z66 Do not resuscitate: Secondary | ICD-10-CM | POA: Diagnosis present

## 2014-01-29 DIAGNOSIS — I10 Essential (primary) hypertension: Secondary | ICD-10-CM | POA: Diagnosis not present

## 2014-01-29 DIAGNOSIS — Z87891 Personal history of nicotine dependence: Secondary | ICD-10-CM

## 2014-01-29 DIAGNOSIS — R1310 Dysphagia, unspecified: Secondary | ICD-10-CM | POA: Diagnosis present

## 2014-01-29 DIAGNOSIS — Z8249 Family history of ischemic heart disease and other diseases of the circulatory system: Secondary | ICD-10-CM | POA: Diagnosis not present

## 2014-01-29 DIAGNOSIS — Z7982 Long term (current) use of aspirin: Secondary | ICD-10-CM | POA: Diagnosis not present

## 2014-01-29 DIAGNOSIS — I160 Hypertensive urgency: Secondary | ICD-10-CM | POA: Diagnosis present

## 2014-01-29 LAB — CBC WITH DIFFERENTIAL/PLATELET
BASOS PCT: 0 % (ref 0–1)
Basophils Absolute: 0 10*3/uL (ref 0.0–0.1)
EOS ABS: 0 10*3/uL (ref 0.0–0.7)
EOS PCT: 0 % (ref 0–5)
HEMATOCRIT: 40.6 % (ref 39.0–52.0)
Hemoglobin: 14 g/dL (ref 13.0–17.0)
LYMPHS PCT: 11 % — AB (ref 12–46)
Lymphs Abs: 1.1 10*3/uL (ref 0.7–4.0)
MCH: 30.7 pg (ref 26.0–34.0)
MCHC: 34.5 g/dL (ref 30.0–36.0)
MCV: 89 fL (ref 78.0–100.0)
MONOS PCT: 7 % (ref 3–12)
Monocytes Absolute: 0.7 10*3/uL (ref 0.1–1.0)
NEUTROS ABS: 7.8 10*3/uL — AB (ref 1.7–7.7)
Neutrophils Relative %: 82 % — ABNORMAL HIGH (ref 43–77)
Platelets: 98 10*3/uL — ABNORMAL LOW (ref 150–400)
RBC: 4.56 MIL/uL (ref 4.22–5.81)
RDW: 13.3 % (ref 11.5–15.5)
WBC: 9.6 10*3/uL (ref 4.0–10.5)

## 2014-01-29 LAB — COMPREHENSIVE METABOLIC PANEL
ALT: 25 U/L (ref 0–53)
AST: 40 U/L — AB (ref 0–37)
Albumin: 3.1 g/dL — ABNORMAL LOW (ref 3.5–5.2)
Alkaline Phosphatase: 50 U/L (ref 39–117)
BILIRUBIN TOTAL: 1 mg/dL (ref 0.3–1.2)
BUN: 48 mg/dL — ABNORMAL HIGH (ref 6–23)
CHLORIDE: 100 meq/L (ref 96–112)
CO2: 19 meq/L (ref 19–32)
Calcium: 9.2 mg/dL (ref 8.4–10.5)
Creatinine, Ser: 1.55 mg/dL — ABNORMAL HIGH (ref 0.50–1.35)
GFR calc non Af Amer: 42 mL/min — ABNORMAL LOW (ref >90)
GFR, EST AFRICAN AMERICAN: 48 mL/min — AB (ref >90)
Glucose, Bld: 161 mg/dL — ABNORMAL HIGH (ref 70–99)
POTASSIUM: 4.1 meq/L (ref 3.7–5.3)
Sodium: 137 mEq/L (ref 137–147)
Total Protein: 7.2 g/dL (ref 6.0–8.3)

## 2014-01-29 LAB — URINALYSIS, ROUTINE W REFLEX MICROSCOPIC
BILIRUBIN URINE: NEGATIVE
Glucose, UA: NEGATIVE mg/dL
Nitrite: NEGATIVE
Protein, ur: 30 mg/dL — AB
Specific Gravity, Urine: >1.03 — ABNORMAL HIGH (ref 1.005–1.030)
Urobilinogen, UA: 0.2 mg/dL (ref 0.0–1.0)
pH: 5 (ref 5.0–8.0)

## 2014-01-29 LAB — URINE MICROSCOPIC-ADD ON

## 2014-01-29 LAB — CBG MONITORING, ED: Glucose-Capillary: 158 mg/dL — ABNORMAL HIGH (ref 70–99)

## 2014-01-29 LAB — LACTIC ACID, PLASMA: LACTIC ACID, VENOUS: 1.5 mmol/L (ref 0.5–2.2)

## 2014-01-29 MED ORDER — SODIUM CHLORIDE 0.9 % IV BOLUS (SEPSIS)
1000.0000 mL | Freq: Once | INTRAVENOUS | Status: AC
  Administered 2014-01-29: 1000 mL via INTRAVENOUS

## 2014-01-29 MED ORDER — SODIUM CHLORIDE 0.9 % IV SOLN
Freq: Once | INTRAVENOUS | Status: AC
  Administered 2014-01-29: 19:00:00 via INTRAVENOUS

## 2014-01-29 MED ORDER — SODIUM CHLORIDE 0.9 % IV SOLN
INTRAVENOUS | Status: DC
  Administered 2014-01-30 – 2014-01-31 (×2): via INTRAVENOUS

## 2014-01-29 MED ORDER — ONDANSETRON HCL 4 MG/2ML IJ SOLN
4.0000 mg | Freq: Once | INTRAMUSCULAR | Status: AC
  Administered 2014-01-29: 4 mg via INTRAVENOUS
  Filled 2014-01-29: qty 2

## 2014-01-29 NOTE — ED Notes (Signed)
Patient transported to X-ray 

## 2014-01-29 NOTE — ED Notes (Signed)
Pt given some water to drink at this time. Pt informed to only take small sips to began with.

## 2014-01-29 NOTE — ED Provider Notes (Signed)
CSN: [id number]     Arrival date & time 01/29/14  1602 History   First MD Initiated Contact with Patient 01/29/14 1613  This chart was scribed for Rolland Porter, MD by Valera Castle, ED Scribe. This patient was seen in room APA12/APA12 and the patient's care was started at 4:19 PM.    Chief Complaint  Patient presents with  . Emesis  . Diarrhea  . Headache   (Consider location/radiation/quality/duration/timing/severity/associated sxs/prior Treatment) The history is provided by the patient. No language interpreter was used.   HPI Comments: Ryan [name] is a 79 y.o. male who presents to the Emergency Department complaining of intermittent nausea, vomiting, and diarrhea, onset 4 days ago, with associated fever and headache. He reports a max temperature of 103 with his fever. He reports having eaten very little since onset of his symptoms, but states he has been able to keep some Jello down. He reports his vomiting and diarrhea have subsided some since onset. He reports having a bowel movement today, watery stool, and states he produced less stool than he has been. He also reports associated cough and SOB. He reports being able to walk a little bit today, but states prolonged exertion exacerbates his SOB and cough. He also states his palms and feet hurt. He denies abdominal pain, and any other associated symptoms. He denies taking any prescription medications daily.  PCP - Ignatius Specking., MD  Past Medical History  Diagnosis Date  . Essential hypertension, benign      Hospital admission 10/13  . Abnormal ECG   . Type 2 diabetes mellitus   . Coronary atherosclerosis of native coronary artery     Based on Myoview - managing medically  . Dementia     Possible   Past Surgical History  Procedure Laterality Date  . Hernia repair    . Prostate surgery    . Knee surgery    . Hip surgery     Family History  Problem Relation Age of Onset  . Heart attack Mother   . COPD Father    History   Substance Use Topics  . Smoking status: Former Smoker    Types: Cigarettes  . Smokeless tobacco: Not on file  . Alcohol Use: No    Review of Systems  Constitutional: Positive for fever. Negative for chills, diaphoresis, appetite change and fatigue.  HENT: Negative for mouth sores, sore throat and trouble swallowing.   Eyes: Negative for visual disturbance.  Respiratory: Positive for cough and shortness of breath. Negative for chest tightness and wheezing.   Cardiovascular: Negative for chest pain.  Gastrointestinal: Positive for nausea, vomiting and diarrhea. Negative for abdominal pain and abdominal distention.  Endocrine: Negative for polydipsia, polyphagia and polyuria.  Genitourinary: Negative for dysuria, frequency and hematuria.  Musculoskeletal: Positive for arthralgias (bilateral palms and feet). Negative for gait problem.  Skin: Negative for color change, pallor and rash.  Neurological: Positive for headaches. Negative for dizziness, syncope and light-headedness.  Hematological: Does not bruise/bleed easily.  Psychiatric/Behavioral: Negative for behavioral problems and confusion.    Allergies  Lisinopril  Home Medications   Prior to Admission medications   Medication Sig Start Date End Date Taking? Authorizing Provider  amLODipine (NORVASC) 10 MG tablet Take 10 mg by mouth daily.    Historical Provider, MD  Ascorbic Acid (VITAMIN C) 1000 MG tablet Take 1,000 mg by mouth daily.    Historical Provider, MD  aspirin EC 81 MG tablet Take 162 mg by mouth daily. 07/08/12  Osvaldo ShipperGokul Krishnan, MD  cyanocobalamin 500 MCG tablet Take 500 mcg by mouth daily.    Historical Provider, MD  Garlic 1000 MG CAPS Take 1,000 mg by mouth every other day.    Historical Provider, MD  metFORMIN (GLUCOPHAGE) 500 MG tablet Take 500 mg by mouth daily with breakfast.    Historical Provider, MD   BP 151/79  Pulse 82  Temp(Src) 97.5 F (36.4 C) (Oral)  Resp 20  Wt 180 lb (81.647 kg)  SpO2  95% Physical Exam  Constitutional: He is oriented to person, place, and time. He appears well-developed and well-nourished. No distress.  HENT:  Head: Normocephalic.  Mouth/Throat: Uvula is midline and oropharynx is clear and moist. Mucous membranes are dry.  Dry mucous membranes. No lesions. Denture plate in place.   Eyes: Conjunctivae and EOM are normal. Pupils are equal, round, and reactive to light. No scleral icterus.  Neck: Normal range of motion. Neck supple.  Cardiovascular: Normal rate and regular rhythm.  Exam reveals no gallop and no friction rub.   No murmur heard. Pulmonary/Chest: Effort normal. No respiratory distress. He has no wheezes. He has rhonchi. He has no rales.  Diffuse scattered rhonchi at both bases. No crackles.  Abdominal: Soft. He exhibits no distension. Bowel sounds are increased. There is no tenderness. There is no rebound.  Musculoskeletal: Normal range of motion. He exhibits no tenderness.  Neurological: He is alert and oriented to person, place, and time.  Skin: Skin is warm and dry. No rash noted. There is erythema.  Erythematous over bilateral palms.   Psychiatric: He has a normal mood and affect. His behavior is normal.   ED Course  Procedures (including critical care time) DIAGNOSTIC STUDIES: Oxygen Saturation is 95% on room air, adequate by my interpretation.    COORDINATION OF CARE: 4:25 PM-Discussed treatment plan which includes CXR, IV fluids, and UA with pt at bedside and pt agreed to plan.   Results for orders placed during the hospital encounter of 01/29/14  CBC WITH DIFFERENTIAL      Result Value Ref Range   WBC 9.6  4.0 - 10.5 K/uL   RBC 4.56  4.22 - 5.81 MIL/uL   Hemoglobin 14.0  13.0 - 17.0 g/dL   HCT 11.940.6  14.739.0 - 82.952.0 %   MCV 89.0  78.0 - 100.0 fL   MCH 30.7  26.0 - 34.0 pg   MCHC 34.5  30.0 - 36.0 g/dL   RDW 56.213.3  13.011.5 - 86.515.5 %   Platelets 98 (*) 150 - 400 K/uL   Neutrophils Relative % 82 (*) 43 - 77 %   Lymphocytes Relative  11 (*) 12 - 46 %   Monocytes Relative 7  3 - 12 %   Eosinophils Relative 0  0 - 5 %   Basophils Relative 0  0 - 1 %   Neutro Abs 7.8 (*) 1.7 - 7.7 K/uL   Lymphs Abs 1.1  0.7 - 4.0 K/uL   Monocytes Absolute 0.7  0.1 - 1.0 K/uL   Eosinophils Absolute 0.0  0.0 - 0.7 K/uL   Basophils Absolute 0.0  0.0 - 0.1 K/uL   WBC Morphology ATYPICAL LYMPHOCYTES     Smear Review PLATELET COUNT CONFIRMED BY SMEAR    COMPREHENSIVE METABOLIC PANEL      Result Value Ref Range   Sodium 137  137 - 147 mEq/L   Potassium 4.1  3.7 - 5.3 mEq/L   Chloride 100  96 - 112 mEq/L  CO2 19  19 - 32 mEq/L   Glucose, Bld 161 (*) 70 - 99 mg/dL   BUN 48 (*) 6 - 23 mg/dL   Creatinine, Ser 7.84 (*) 0.50 - 1.35 mg/dL   Calcium 9.2  8.4 - 69.6 mg/dL   Total Protein 7.2  6.0 - 8.3 g/dL   Albumin 3.1 (*) 3.5 - 5.2 g/dL   AST 40 (*) 0 - 37 U/L   ALT 25  0 - 53 U/L   Alkaline Phosphatase 50  39 - 117 U/L   Total Bilirubin 1.0  0.3 - 1.2 mg/dL   GFR calc non Af Amer 42 (*) >90 mL/min   GFR calc Af Amer 48 (*) >90 mL/min  URINALYSIS, ROUTINE W REFLEX MICROSCOPIC      Result Value Ref Range   Color, Urine YELLOW  YELLOW   APPearance HAZY (*) CLEAR   Specific Gravity, Urine >1.030 (*) 1.005 - 1.030   pH 5.0  5.0 - 8.0   Glucose, UA NEGATIVE  NEGATIVE mg/dL   Hgb urine dipstick TRACE (*) NEGATIVE   Bilirubin Urine NEGATIVE  NEGATIVE   Ketones, ur TRACE (*) NEGATIVE mg/dL   Protein, ur 30 (*) NEGATIVE mg/dL   Urobilinogen, UA 0.2  0.0 - 1.0 mg/dL   Nitrite NEGATIVE  NEGATIVE   Leukocytes, UA TRACE (*) NEGATIVE  LACTIC ACID, PLASMA      Result Value Ref Range   Lactic Acid, Venous 1.5  0.5 - 2.2 mmol/L  URINE MICROSCOPIC-ADD ON      Result Value Ref Range   Squamous Epithelial / LPF MANY (*) RARE   WBC, UA 3-6  <3 WBC/hpf   RBC / HPF 0-2  <3 RBC/hpf   Bacteria, UA FEW (*) RARE   Casts HYALINE CASTS (*) NEGATIVE  CBG MONITORING, ED      Result Value Ref Range   Glucose-Capillary 158 (*) 70 - 99 mg/dL   Comment 1  Documented in Chart     Comment 2 Notify RN     Dg Chest 2 View  01/29/2014   CLINICAL DATA:  Weakness, headache, diarrhea, vomiting, and chest discomfort today, history dementia, hypertension, diabetes, former smoker  EXAM: CHEST  2 VIEW  COMPARISON:  07/07/2012  FINDINGS: Normal heart size, mediastinal contours and pulmonary vascularity.  Atherosclerotic calcification aorta.  Emphysematous changes without infiltrate, pleural effusion, or pneumothorax.  Bones unremarkable.  IMPRESSION: COPD changes.  No acute abnormalities.   Electronically Signed   By: Ulyses Southward M.D.   On: 01/29/2014 18:21     EKG Interpretation None     Medications  sodium chloride 0.9 % bolus 1,000 mL (0 mLs Intravenous Stopped 01/29/14 1857)  0.9 %  sodium chloride infusion ( Intravenous New Bag/Given 01/29/14 1902)  ondansetron (ZOFRAN) injection 4 mg (4 mg Intravenous Given 01/29/14 1911)  sodium chloride 0.9 % bolus 1,000 mL (1,000 mLs Intravenous New Bag/Given 01/29/14 2030)   MDM   Final diagnoses:  Diarrhea  Acute kidney injury  Dehydration    I checked on the patient and he was feeling better has not had any additional diarrhea. He still fell his mouth was dry. He was given some by mouth liquids and tolerating them fairly well.  Went back and reevaluated him. I stand him at the bedside, and he complained of feeling very weak and dizzy. He has not have marked rise in heart rate or drop in blood pressure with this.   I personally performed the services described in  this documentation, which was scribed in my presence. The recorded information has been reviewed and is accurate.    Rolland Porter, MD 01/29/14 2136

## 2014-01-29 NOTE — ED Notes (Signed)
Pt states he ate some JellO around 1330 and has been able to keep down but is c/o nausea at present

## 2014-01-29 NOTE — ED Notes (Signed)
Pt states he really was not feeling that much better & wants to see EDP again. When this nurse told pt he was going to get to go home he ask how I'm I going to get home? I ask pt if he should call his friend back & he replied he is half way home be now. Pt eating jello when this nurse was leaving the room.

## 2014-01-29 NOTE — ED Notes (Signed)
Pt with N/V/D since last Friday with fever and HA

## 2014-01-29 NOTE — H&P (Signed)
[facility] History and Physical  Ryan [name] WGN:[id number] DOB: Jan 22, 1935 DOA: 01/29/2014  Referring physician:  PCP: Ignatius SpeckingVYAS,DHRUV B., MD  Specialists:   Chief Complaint: diarrhea   HPI: Neville RouteJoseph W Spade is a 79 y.o. male with PMH of HTN, h/o tobacco use, COPD presented with diarrhea for 3 days, associated with chills, nausea but no vomiting; He reports eating at the restaurant at class reunion on Thursday then developed diarrhea on Friday; denies abdominal pain, no hematochezia, no hematemesis; he has mild dizziness, no focal neurological weakness; denies chest ain, no SOB, has chronic cough  -patient does not take any medications at home   Review of Systems: The patient denies anorexia, fever, weight loss,, vision loss, decreased hearing, hoarseness, chest pain, syncope, dyspnea on exertion, peripheral edema, balance deficits, hemoptysis, abdominal pain, melena, hematochezia, severe indigestion/heartburn, hematuria, incontinence, genital sores, muscle weakness, suspicious skin lesions, transient blindness, difficulty walking, depression, unusual weight change, abnormal bleeding, enlarged lymph nodes, angioedema, and breast masses.    Past Medical History  Diagnosis Date  . Essential hypertension, benign      Hospital admission 10/13  . Abnormal ECG   . Type 2 diabetes mellitus   . Coronary atherosclerosis of native coronary artery     Based on Myoview - managing medically  . Dementia     Possible   Past Surgical History  Procedure Laterality Date  . Hernia repair    . Prostate surgery    . Knee surgery    . Hip surgery     Social History:  reports that he has quit smoking. His smoking use included Cigarettes. He smoked 0.00 packs per day. He does not have any smokeless tobacco history on file. He reports that he does not drink alcohol or use illicit drugs. Home ;  where does patient live--home, ALF, SNF? and with whom if at home? Yes;  Can patient participate in  ADLs?  Allergies  Allergen Reactions  . Lisinopril Cough    Family History  Problem Relation Age of Onset  . Heart attack Mother   . COPD Father     (be sure to complete)  Prior to Admission medications   Medication Sig Start Date End Date Taking? Authorizing Provider  aspirin EC 81 MG tablet Take 81 mg by mouth at bedtime.  07/08/12  Yes Osvaldo ShipperGokul Krishnan, MD  Garlic 1000 MG CAPS Take 3 capsules by mouth every morning.    Yes Historical Provider, MD  Red Yeast Rice Extract (RED YEAST RICE PO) Take 1 capsule by mouth 2 (two) times daily.   Yes Historical Provider, MD  Ascorbic Acid (VITAMIN C) 1000 MG tablet Take 1,000 mg by mouth daily.    Historical Provider, MD  cyanocobalamin 500 MCG tablet Take 500 mcg by mouth daily.    Historical Provider, MD   Physical Exam: Filed Vitals:   01/29/14 2111  BP: 148/72  Pulse: 69  Temp:   Resp: 14     General:  alert  Eyes: eom-i  ENT: no oral ulcers   Neck: supple   Cardiovascular: s1,s2 rrr  Respiratory: diminished in bases   Abdomen: soft, nt,nd   Skin: no rash  Musculoskeletal: no LE edema  Psychiatric: no hallucinations   Neurologic: CN 2-12 intact, AAAx3; motor 5/5 BL  Labs on Admission:  Basic Metabolic Panel:  Recent Labs Lab 01/29/14 1652  NA 137  K 4.1  CL 100  CO2 19  GLUCOSE 161*  BUN 48*  CREATININE 1.55*  CALCIUM  9.2   Liver Function Tests:  Recent Labs Lab 01/29/14 1652  AST 40*  ALT 25  ALKPHOS 50  BILITOT 1.0  PROT 7.2  ALBUMIN 3.1*   No results found for this basename: LIPASE, AMYLASE,  in the last 168 hours No results found for this basename: AMMONIA,  in the last 168 hours CBC:  Recent Labs Lab 01/29/14 1652  WBC 9.6  NEUTROABS 7.8*  HGB 14.0  HCT 40.6  MCV 89.0  PLT 98*   Cardiac Enzymes: No results found for this basename: CKTOTAL, CKMB, CKMBINDEX, TROPONINI,  in the last 168 hours  BNP (last 3 results) No results found for this basename: PROBNP,  in the last  8760 hours CBG:  Recent Labs Lab 01/29/14 1609  GLUCAP 158*    Radiological Exams on Admission: Dg Chest 2 View  01/29/2014   CLINICAL DATA:  Weakness, headache, diarrhea, vomiting, and chest discomfort today, history dementia, hypertension, diabetes, former smoker  EXAM: CHEST  2 VIEW  COMPARISON:  07/07/2012  FINDINGS: Normal heart size, mediastinal contours and pulmonary vascularity.  Atherosclerotic calcification aorta.  Emphysematous changes without infiltrate, pleural effusion, or pneumothorax.  Bones unremarkable.  IMPRESSION: COPD changes.  No acute abnormalities.   Electronically Signed   By: Ulyses Southward M.D.   On: 01/29/2014 18:21    EKG: Independently reviewed.   Assessment/Plan Principal Problem:   Diarrhea Active Problems:   AKI (acute kidney injury)   Essential hypertension, benign   HTN (hypertension)  79 y.o. male with PMH of HTN, h/o tobacco use, COPD presented with diarrhea for 3 days after eating at the restaurant  -found to have dehydration, AKI   1. Diarrhea likely gastroenteritis; vs food poisoning; afebrile, no leukocytosis   -cont IVF, antiemetics, supportive care; Pt reports that diarrhea is improving, if persistent will obtain stool test   2. AKI likely prerenal due to diarrhea;  -cont IVF, recheck labs in AM; f/u urine cultures: clinically not suspicious for UTI  3. COPD, h/o tobacco use; CXR: no acute findings; but COPD -cont inhalers as needed   D/w patient -he is DNR  None;  if consultant consulted, please document name and whether formally or informally consulted  Code Status: DNR (must indicate code status--if unknown or must be presumed, indicate so) Family Communication: d/w patient (indicate person spoken with, if applicable, with phone number if by telephone) Disposition Plan: home 24-48 hours  (indicate anticipated LOS)  Time spent: >35 minutes   Esperanza Sheets [facility] Pager [phone]  If 7PM-7AM, please contact  night-coverage [email or web address] Password [facility] 01/29/2014, 10:28 PM

## 2014-01-30 ENCOUNTER — Observation Stay (HOSPITAL_COMMUNITY): Payer: Medicare Other

## 2014-01-30 DIAGNOSIS — I10 Essential (primary) hypertension: Secondary | ICD-10-CM | POA: Diagnosis not present

## 2014-01-30 DIAGNOSIS — E860 Dehydration: Secondary | ICD-10-CM | POA: Diagnosis not present

## 2014-01-30 DIAGNOSIS — N179 Acute kidney failure, unspecified: Secondary | ICD-10-CM | POA: Diagnosis not present

## 2014-01-30 DIAGNOSIS — E119 Type 2 diabetes mellitus without complications: Secondary | ICD-10-CM

## 2014-01-30 DIAGNOSIS — R112 Nausea with vomiting, unspecified: Secondary | ICD-10-CM | POA: Diagnosis not present

## 2014-01-30 DIAGNOSIS — R197 Diarrhea, unspecified: Secondary | ICD-10-CM | POA: Diagnosis not present

## 2014-01-30 LAB — BASIC METABOLIC PANEL
BUN: 32 mg/dL — AB (ref 6–23)
CHLORIDE: 108 meq/L (ref 96–112)
CO2: 22 meq/L (ref 19–32)
Calcium: 8.5 mg/dL (ref 8.4–10.5)
Creatinine, Ser: 1.03 mg/dL (ref 0.50–1.35)
GFR calc Af Amer: 79 mL/min — ABNORMAL LOW (ref >90)
GFR calc non Af Amer: 68 mL/min — ABNORMAL LOW (ref >90)
GLUCOSE: 122 mg/dL — AB (ref 70–99)
POTASSIUM: 4.1 meq/L (ref 3.7–5.3)
Sodium: 143 mEq/L (ref 137–147)

## 2014-01-30 LAB — URINE CULTURE: SPECIAL REQUESTS: NORMAL

## 2014-01-30 LAB — HEMOGLOBIN A1C
Hgb A1c MFr Bld: 6.6 % — ABNORMAL HIGH (ref <5.7)
Mean Plasma Glucose: 143 mg/dL — ABNORMAL HIGH (ref <117)

## 2014-01-30 LAB — GLUCOSE, CAPILLARY
GLUCOSE-CAPILLARY: 152 mg/dL — AB (ref 70–99)
Glucose-Capillary: 101 mg/dL — ABNORMAL HIGH (ref 70–99)

## 2014-01-30 MED ORDER — ASPIRIN EC 81 MG PO TBEC
81.0000 mg | DELAYED_RELEASE_TABLET | Freq: Every day | ORAL | Status: DC
  Administered 2014-01-30 – 2014-01-31 (×3): 81 mg via ORAL
  Filled 2014-01-30 (×3): qty 1

## 2014-01-30 MED ORDER — VITAMIN B-12 1000 MCG PO TABS
500.0000 ug | ORAL_TABLET | Freq: Every day | ORAL | Status: DC
  Administered 2014-01-30 – 2014-02-01 (×3): 500 ug via ORAL
  Filled 2014-01-30 (×3): qty 1

## 2014-01-30 MED ORDER — ACETAMINOPHEN 325 MG PO TABS: 650.0000 mg | ORAL_TABLET | Freq: Four times a day (QID) | ORAL | Status: DC | PRN

## 2014-01-30 MED ORDER — ENOXAPARIN SODIUM 40 MG/0.4ML ~~LOC~~ SOLN
40.0000 mg | SUBCUTANEOUS | Status: DC
  Administered 2014-01-30 – 2014-02-01 (×3): 40 mg via SUBCUTANEOUS
  Filled 2014-01-30 (×3): qty 0.4

## 2014-01-30 MED ORDER — INSULIN ASPART 100 UNIT/ML ~~LOC~~ SOLN
0.0000 [IU] | Freq: Three times a day (TID) | SUBCUTANEOUS | Status: DC
  Administered 2014-01-30: 2 [IU] via SUBCUTANEOUS
  Administered 2014-01-31 – 2014-02-01 (×3): 1 [IU] via SUBCUTANEOUS

## 2014-01-30 MED ORDER — ACETAMINOPHEN 650 MG RE SUPP: 650.0000 mg | Freq: Four times a day (QID) | RECTAL | Status: DC | PRN

## 2014-01-30 MED ORDER — GARLIC 1000 MG PO CAPS: 3.0000 | ORAL_CAPSULE | Freq: Every morning | ORAL | Status: DC

## 2014-01-30 MED ORDER — ASPIRIN EC 81 MG PO TBEC: 81.0000 mg | DELAYED_RELEASE_TABLET | Freq: Every day | ORAL | Status: DC

## 2014-01-30 MED ORDER — IPRATROPIUM-ALBUTEROL 0.5-2.5 (3) MG/3ML IN SOLN: 3.0000 mL | Freq: Four times a day (QID) | RESPIRATORY_TRACT | Status: DC | PRN

## 2014-01-30 MED ORDER — ONDANSETRON HCL 4 MG PO TABS: 4.0000 mg | ORAL_TABLET | Freq: Four times a day (QID) | ORAL | Status: DC | PRN

## 2014-01-30 MED ORDER — INSULIN ASPART 100 UNIT/ML ~~LOC~~ SOLN
0.0000 [IU] | Freq: Every day | SUBCUTANEOUS | Status: DC
Start: 2014-01-30 — End: 2014-02-01

## 2014-01-30 MED ORDER — VITAMIN C 500 MG PO TABS
1000.0000 mg | ORAL_TABLET | Freq: Every day | ORAL | Status: DC
  Administered 2014-01-30 – 2014-02-01 (×3): 1000 mg via ORAL
  Filled 2014-01-30 (×3): qty 2

## 2014-01-30 MED ORDER — ONDANSETRON HCL 4 MG/2ML IJ SOLN
4.0000 mg | Freq: Four times a day (QID) | INTRAMUSCULAR | Status: DC | PRN
  Administered 2014-01-30 (×2): 4 mg via INTRAVENOUS
  Filled 2014-01-30 (×2): qty 2

## 2014-01-30 NOTE — Plan of Care (Signed)
Have tried 4X (2 different RN's) to get another IV - all unsuccessful.  PACU has been contacted and will come up to attempt.

## 2014-01-30 NOTE — Care Management Note (Addendum)
    Page 1 of 1   02/01/2014     2:24:19 PM CARE MANAGEMENT NOTE 02/01/2014  Patient:  Ryan [name], Ryan [name]   Account Number:  [id number]  Date Initiated:  01/30/2014  Documentation initiated by:  Rosemary Holms  Subjective/Objective Assessment:   Pt admitted from home where he lives alone. States he has no family and his pastor brought him to the hospital. Unable to discuss DC plan, pt stated he feels too bad     Action/Plan:   Anticipated DC Date:  02/01/2014   Anticipated DC Plan:  HOME W HOME HEALTH SERVICES      DC Planning Services  CM consult      [facility] Choice  HOME HEALTH   Choice offered to / List presented to:  C-1 Patient        HH arranged  HH-2 PT  HH-5 SPEECH THERAPY      HH agency  [facility]   Status of service:  Completed, signed off Medicare Important Message given?  YES (If response is "NO", the following Medicare IM given date fields will be blank) Date Medicare IM given:  02/01/2014 Date Additional Medicare IM given:    Discharge Disposition:    Per UR Regulation:    If discussed at Long Length of Stay Meetings, dates discussed:    Comments:  02/01/14 1350 Anibal Henderson RN/CM  IM given 01/30/14 Rosemary Holms RN BSN CM

## 2014-01-30 NOTE — Progress Notes (Signed)
UR completed 

## 2014-01-30 NOTE — Progress Notes (Signed)
Patient seen and examined.  Above note reviewed.  Patient has been admitted with acute kidney injury secondary to dehydration related to gastroenteritis. We'll continue with supportive treatment. Renal function has improved with intravenous hydration. Advance diet as tolerated. Anticipate discharge home in next 24 hours.  [employer]

## 2014-01-30 NOTE — Progress Notes (Signed)
[facility] PROGRESS NOTE  [place] [facility] TDD:[id number] DOB: 06-21-1935 DOA: 01/29/2014 PCP: Ignatius Specking., MD  Summary: 79 y.o. male with PMH of HTN, h/o tobacco use, COPD presented with diarrhea for 3 days after eating at the restaurant found to have dehydration, AKI      Assessment/Plan: 1. Diarrhea/Nausea likely gastroenteritis; vs food poisoning; no further diarrhea but increased abdominal pain and nausea with clear liquids. Rmains afebrile, no leukocytosis. Will continue  IVF, antiemetics, supportive care; will obtain stool test if any further diarrhea will check KUB  2. AKI likely prerenal due to diarrhea; resolved this am. Cont IVF, recheck labs in AM; f/u urine cultures: clinically not suspicious for UTI   3. COPD, h/o tobacco use; CXR: no acute findings; but COPD.moist non-productive cough. cont inhalers as needed   4. DM 2: will obtain HgA1c. Diet controlled at home. Will use SSI and carb modified diet when eating.    Code Status: DNR Family Communication: none present Disposition Plan: home when ready   Consultants:  none  Procedures:  none  Antibiotics:  none  HPI/Subjective: Lying in bed. Reports increased abdominal pain and nausea.   Objective: Filed Vitals:   01/30/14 0827  BP:   Pulse: 72  Temp:   Resp: 18    Intake/Output Summary (Last 24 hours) at 01/30/14 1158 Last data filed at 01/30/14 1100  Gross per 24 hour  Intake   1000 ml  Output    800 ml  Net    200 ml   Filed Weights   01/29/14 1605 01/30/14 0012  Weight: 81.647 kg (180 lb) 79.6 kg (175 lb 7.8 oz)    Exam:   General:  Thin NAD but appears uncomfortable  Cardiovascular: RRR No MGR No LE edema  Respiratory: normal effort BS coarse throughout somewhat diminished in bases  Abdomen: flat soft sluggish BS mild diffuse tenderness to palpation  Musculoskeletal: no clubbing or cyanosis   Data Reviewed: Basic Metabolic Panel:  Recent Labs Lab 01/29/14 1652  01/30/14 0609  NA 137 143  K 4.1 4.1  CL 100 108  CO2 19 22  GLUCOSE 161* 122*  BUN 48* 32*  CREATININE 1.55* 1.03  CALCIUM 9.2 8.5   Liver Function Tests:  Recent Labs Lab 01/29/14 1652  AST 40*  ALT 25  ALKPHOS 50  BILITOT 1.0  PROT 7.2  ALBUMIN 3.1*   No results found for this basename: LIPASE, AMYLASE,  in the last 168 hours No results found for this basename: AMMONIA,  in the last 168 hours CBC:  Recent Labs Lab 01/29/14 1652  WBC 9.6  NEUTROABS 7.8*  HGB 14.0  HCT 40.6  MCV 89.0  PLT 98*   Cardiac Enzymes: No results found for this basename: CKTOTAL, CKMB, CKMBINDEX, TROPONINI,  in the last 168 hours BNP (last 3 results) No results found for this basename: PROBNP,  in the last 8760 hours CBG:  Recent Labs Lab 01/29/14 1609  GLUCAP 158*    Recent Results (from the past 240 hour(s))  CULTURE, BLOOD (ROUTINE X 2)     Status: None   Collection Time    01/29/14  4:55 PM      Result Value Ref Range Status   Specimen Description BLOOD RIGHT ANTECUBITAL   Final   Special Requests     Final   Value: BOTTLES DRAWN AEROBIC AND ANAEROBIC AEB=12CC ANA=16CC   Culture NO GROWTH 1 DAY   Final   Report Status PENDING   Incomplete  CULTURE, BLOOD (ROUTINE X 2)     Status: None   Collection Time    01/29/14  5:05 PM      Result Value Ref Range Status   Specimen Description BLOOD RIGHT HAND   Final   Special Requests     Final   Value: BOTTLES DRAWN AEROBIC AND ANAEROBIC AEB=16CC ANA=10CC   Culture NO GROWTH 1 DAY   Final   Report Status PENDING   Incomplete     Studies: Dg Chest 2 View  01/29/2014   CLINICAL DATA:  Weakness, headache, diarrhea, vomiting, and chest discomfort today, history dementia, hypertension, diabetes, former smoker  EXAM: CHEST  2 VIEW  COMPARISON:  07/07/2012  FINDINGS: Normal heart size, mediastinal contours and pulmonary vascularity.  Atherosclerotic calcification aorta.  Emphysematous changes without infiltrate, pleural effusion, or  pneumothorax.  Bones unremarkable.  IMPRESSION: COPD changes.  No acute abnormalities.   Electronically Signed   By: Ulyses SouthwardMark  Boles M.D.   On: 01/29/2014 18:21    Scheduled Meds: . aspirin EC  81 mg Oral QHS  . aspirin EC  81 mg Oral QHS  . enoxaparin (LOVENOX) injection  40 mg Subcutaneous Q24H  . cyanocobalamin  500 mcg Oral Daily  . vitamin C  1,000 mg Oral Daily   Continuous Infusions: . sodium chloride 75 mL/hr at 01/30/14 16100813    Principal Problem:   Diarrhea Active Problems:   Type 2 diabetes mellitus   Coronary atherosclerosis of native coronary artery   Essential hypertension, benign   HTN (hypertension)   AKI (acute kidney injury)   Acute kidney injury    Time spent: 35 minutes    Lesle ChrisKaren M Park End[facility] LLCBlack  [facility] Pager [phone]. If 7PM-7AM, please contact night-coverage at [email or web address], password [email or web address] 01/30/2014, 11:58 AM  LOS: 1 day

## 2014-01-31 ENCOUNTER — Observation Stay (HOSPITAL_COMMUNITY): Payer: Medicare Other

## 2014-01-31 DIAGNOSIS — N179 Acute kidney failure, unspecified: Secondary | ICD-10-CM | POA: Diagnosis not present

## 2014-01-31 DIAGNOSIS — R197 Diarrhea, unspecified: Secondary | ICD-10-CM | POA: Diagnosis not present

## 2014-01-31 DIAGNOSIS — F0390 Unspecified dementia without behavioral disturbance: Secondary | ICD-10-CM

## 2014-01-31 DIAGNOSIS — R1310 Dysphagia, unspecified: Secondary | ICD-10-CM

## 2014-01-31 DIAGNOSIS — E860 Dehydration: Secondary | ICD-10-CM | POA: Diagnosis not present

## 2014-01-31 LAB — BASIC METABOLIC PANEL
BUN: 17 mg/dL (ref 6–23)
CHLORIDE: 108 meq/L (ref 96–112)
CO2: 24 mEq/L (ref 19–32)
Calcium: 8.5 mg/dL (ref 8.4–10.5)
Creatinine, Ser: 0.85 mg/dL (ref 0.50–1.35)
GFR calc Af Amer: >90 mL/min (ref >90)
GFR calc non Af Amer: 83 mL/min — ABNORMAL LOW (ref >90)
Glucose, Bld: 110 mg/dL — ABNORMAL HIGH (ref 70–99)
POTASSIUM: 3.8 meq/L (ref 3.7–5.3)
SODIUM: 143 meq/L (ref 137–147)

## 2014-01-31 LAB — GLUCOSE, CAPILLARY
Glucose-Capillary: 98 mg/dL (ref 70–99)
Glucose-Capillary: 130 mg/dL — ABNORMAL HIGH (ref 70–99)
Glucose-Capillary: 139 mg/dL — ABNORMAL HIGH (ref 70–99)

## 2014-01-31 LAB — RPR

## 2014-01-31 LAB — CBC
HEMATOCRIT: 34.4 % — AB (ref 39.0–52.0)
HEMOGLOBIN: 11.5 g/dL — AB (ref 13.0–17.0)
MCH: 30.5 pg (ref 26.0–34.0)
MCHC: 33.4 g/dL (ref 30.0–36.0)
MCV: 91.2 fL (ref 78.0–100.0)
Platelets: 93 10*3/uL — ABNORMAL LOW (ref 150–400)
RBC: 3.77 MIL/uL — ABNORMAL LOW (ref 4.22–5.81)
RDW: 13.5 % (ref 11.5–15.5)
WBC: 5.5 10*3/uL (ref 4.0–10.5)

## 2014-01-31 MED ORDER — SODIUM CHLORIDE 0.9 % IV SOLN: INTRAVENOUS | Status: DC

## 2014-01-31 MED ORDER — AMLODIPINE BESYLATE 5 MG PO TABS
5.0000 mg | ORAL_TABLET | Freq: Every day | ORAL | Status: DC
  Administered 2014-02-01: 5 mg via ORAL
  Filled 2014-01-31: qty 1

## 2014-01-31 NOTE — Progress Notes (Signed)
[facility] PROGRESS NOTE  [facility]ulan FessJoseph W [facility] WUJ:[id number] DOB: 1935/06/10 DOA: 01/29/2014 PCP: Ignatius SpeckingVYAS,DHRUV B., MD  Summary:  79 y.o. male with PMH of HTN, h/o tobacco use, COPD presented with diarrhea for 3 days after eating at the restaurant found to have dehydration, AKI    Assessment/Plan: 1. Diarrhea/Nausea likely gastroenteritis; vs food poisoning; no further diarrhea and no nausea this am. Requesting food. Will advance diet to soft.  Remains afebrile, no leukocytosis. Will decrease IVF, continue  antiemetics, supportive care; will obtain stool test if any further diarrhea will check KUB  2. AKI likely prerenal due to diarrhea; resolved this am. urine cultures unremarkable: clinically not suspicious for UTI  3. COPD, h/o tobacco use; CXR: no acute findings; but COPD.moist non-productive cough particularly with drinking. Concern for aspiration. Will request ST bedside evaluation.  cont inhalers as needed  4. DM 2: HgA1c 6.6. Diet controlled at home. Will use SSI and carb modified diet when eating. 5. Dysphagia: patient with frequent cough with drinking and clearing of throat with liquids. Coughing up jello tinged sputum. Will request speech therapy to do bedside eval.  6. Dementia:  Possible diagnosis. Obvious significant problems with memory. Patient admits to "no memory". Currently lives alone with close "neighbor" support. Has had some falls. Will request PT eval as well.   Code Status: DNR Family Communication: neighbor at bedside Disposition Plan: home or may need placement   Consultants:  none  Procedures:  none  Antibiotics:  none  HPI/Subjective: Sitting up alert. Oriented to place and self. Reports wanting to eat and go home. Denies pain/nausea/vomiting/diarrhea  Objective: Filed Vitals:   01/31/14 0536  BP: 157/75  Pulse: 49  Temp:   Resp: 20    Intake/Output Summary (Last 24 hours) at 01/31/14 1008 Last data filed at 01/31/14 0942  Gross per 24  hour  Intake    760 ml  Output   1100 ml  Net   -340 ml   Filed Weights   01/29/14 1605 01/30/14 0012 01/31/14 0536  Weight: 81.647 kg (180 lb) 79.6 kg (175 lb 7.8 oz) 80 kg (176 lb 5.9 oz)    Exam:   General:  Thin frail somewhat chronically ill appearing  Cardiovascular: RRR No MGR No LE edema  Respiratory: normal effort BS somewhat coarse no wheeze  Abdomen: flat soft +BS non-tender to palpation  Musculoskeletal: MAE joints without swelling/erythema   Data Reviewed: Basic Metabolic Panel:  Recent Labs Lab 01/29/14 1652 01/30/14 0609 01/31/14 0548  NA 137 143 143  K 4.1 4.1 3.8  CL 100 108 108  CO2 19 22 24   GLUCOSE 161* 122* 110*  BUN 48* 32* 17  CREATININE 1.55* 1.03 0.85  CALCIUM 9.2 8.5 8.5   Liver Function Tests:  Recent Labs Lab 01/29/14 1652  AST 40*  ALT 25  ALKPHOS 50  BILITOT 1.0  PROT 7.2  ALBUMIN 3.1*   No results found for this basename: LIPASE, AMYLASE,  in the last 168 hours No results found for this basename: AMMONIA,  in the last 168 hours CBC:  Recent Labs Lab 01/29/14 1652 01/31/14 0548  WBC 9.6 5.5  NEUTROABS 7.8*  --   HGB 14.0 11.5*  HCT 40.6 34.4*  MCV 89.0 91.2  PLT 98* 93*   Cardiac Enzymes: No results found for this basename: CKTOTAL, CKMB, CKMBINDEX, TROPONINI,  in the last 168 hours BNP (last 3 results) No results found for this basename: PROBNP,  in the last 8760 hours CBG:  Recent Labs Lab 01/29/14 1609 01/30/14 1231 01/30/14 2012 01/31/14 0756  GLUCAP 158* 152* 101* 98    Recent Results (from the past 240 hour(s))  CULTURE, BLOOD (ROUTINE X 2)     Status: None   Collection Time    01/29/14  4:55 PM      Result Value Ref Range Status   Specimen Description BLOOD RIGHT ANTECUBITAL   Final   Special Requests     Final   Value: BOTTLES DRAWN AEROBIC AND ANAEROBIC AEB=12CC ANA=16CC   Culture NO GROWTH 1 DAY   Final   Report Status PENDING   Incomplete  CULTURE, BLOOD (ROUTINE X 2)     Status: None    Collection Time    01/29/14  5:05 PM      Result Value Ref Range Status   Specimen Description BLOOD RIGHT HAND   Final   Special Requests     Final   Value: BOTTLES DRAWN AEROBIC AND ANAEROBIC AEB=16CC ANA=10CC   Culture NO GROWTH 1 DAY   Final   Report Status PENDING   Incomplete  URINE CULTURE     Status: None   Collection Time    01/29/14  6:50 PM      Result Value Ref Range Status   Specimen Description URINE, CLEAN CATCH   Final   Special Requests Normal   Final   Culture  Setup Time     Final   Value: 01/29/2014 23:24     Performed at [facility] Count     Final   Value: >=100,000 COLONIES/ML     Performed at [facility]   Culture     Final   Value: Multiple bacterial morphotypes present, none predominant. Suggest appropriate recollection if clinically indicated.     Performed at [facility]   Report Status 01/30/2014 FINAL   Final     Studies: Dg Chest 2 View  01/29/2014   CLINICAL DATA:  Weakness, headache, diarrhea, vomiting, and chest discomfort today, history dementia, hypertension, diabetes, former smoker  EXAM: CHEST  2 VIEW  COMPARISON:  07/07/2012  FINDINGS: Normal heart size, mediastinal contours and pulmonary vascularity.  Atherosclerotic calcification aorta.  Emphysematous changes without infiltrate, pleural effusion, or pneumothorax.  Bones unremarkable.  IMPRESSION: COPD changes.  No acute abnormalities.   Electronically Signed   By: Ulyses Southward M.D.   On: 01/29/2014 18:21   Dg Abd 1 View  01/30/2014   CLINICAL DATA:  Persistent nausea and vomiting  EXAM: ABDOMEN - 1 VIEW  COMPARISON:  None  FINDINGS: Normal bowel gas pattern.  Gas in rectum.  No bowel dilatation or bowel wall thickening.  Osseous demineralization with degenerative changes lumbar spine.  Numerous pelvic phleboliths.  No urinary tract calcification.  IMPRESSION: Normal bowel gas pattern.   Electronically Signed   By: Ulyses Southward M.D.   On: 01/30/2014 12:51     Scheduled Meds: . aspirin EC  81 mg Oral QHS  . enoxaparin (LOVENOX) injection  40 mg Subcutaneous Q24H  . insulin aspart  0-5 Units Subcutaneous QHS  . insulin aspart  0-9 Units Subcutaneous TID WC  . cyanocobalamin  500 mcg Oral Daily  . vitamin C  1,000 mg Oral Daily   Continuous Infusions: . sodium chloride 75 mL/hr at 01/31/14 1610    Principal Problem:   Acute kidney injury Active Problems:   Type 2 diabetes mellitus   Coronary atherosclerosis of native coronary artery   Essential  hypertension, benign   Dementia   HTN (hypertension)   Diarrhea   AKI (acute kidney injury)   Dysphagia, unspecified(787.20)    Time spent: 35 minutes    Lesle Chris [facility]  [facility] Pager [phone]. If 7PM-7AM, please contact night-coverage at [email or web address], password [facility] 01/31/2014, 10:08 AM  LOS: 2 days

## 2014-01-31 NOTE — Procedures (Signed)
Objective Swallowing Evaluation: Modified Barium Swallowing Study   Patient Details  Name: Ryan [name] MRN: [id number] Date of Birth: 12/29/1934  Today's Date: 01/31/2014 Time: 1859-0931 SLP Time Calculation (min): 29 min  Past Medical History:  Past Medical History  Diagnosis Date  . Essential hypertension, benign      Hospital admission 10/13  . Abnormal ECG   . Type 2 diabetes mellitus   . Coronary atherosclerosis of native coronary artery     Based on Myoview - managing medically  . Dementia     Possible   Past Surgical History:  Past Surgical History  Procedure Laterality Date  . Hernia repair    . Prostate surgery    . Knee surgery    . Hip surgery     HPI:  [name] is a 79 y.o. male with PMH of HTN, h/o tobacco use, COPD presented with diarrhea for 3 days after eating at the restaurant found to have dehydration, AKI. COPD, h/o tobacco use; CXR: no acute findings; but COPD.moist non-productive cough particularly with drinking. Concern for aspiration. Will request ST bedside evaluation. cont inhalers as needed. Dementia: Possible diagnosis. Obvious significant problems with memory. Patient admits to "no memory". Currently lives alone with close "neighbor" support. Has had some falls.      Assessment / Plan / Recommendation Clinical Impression  Dysphagia Diagnosis: Mild oral phase dysphagia;Mild pharyngeal phase dysphagia;Moderate pharyngeal phase dysphagia Clinical impression: MBSS completed this date due to pt showing soft signs of aspiration at bedside. [name] presents with mild oral phase and mi/mod pharyngeal phase dsyphagia characterized by weak lingual manipulation, prolonged oral transit, piecemeal deglutition, premature spillage with slight delay in swallow initiation, decreased tongue base retraction and epiglottic deflection resulting in lingual residue of solids, vallecular residue of puree, pill, and liquids, variable trace penetration  and aspiration of thins during and after the swallow due to residuals in pharynx. Pt cleared throat spontaneously several times, but only coughed when cued in attempt to clear aspirate from anterior tracheal wall (ineffective). Pt seemingly has a strong cough, but was unable to remove aspirate or what remained in laryngeal vestibule. Penetration/aspiration was not in gross amounts, however over the course of a meal/day I would suspect could be problematic. When results were reviewed with pt, he was befuddled and asked why he wasn't "getting strangled" and SLP assured him that all of the throat clearing and delayed couging after meals are signs of aspiration. When pt swallowed the barium tablet, it dropped to the valleculae and remained until he was cued to take a few bites of applesauce which cleared pill from valleculae. It should be noted that pt initially could tell that "something was hung" when the pill stopped in valleculae, however he reported that it had cleared when it had not. Recommend diet modifcation to mechanical soft and nectar-thick liquids. Cue pt to "swallow hard" and swallow 2x with each bite/sip. Prognosis for upgrade to thin is good with skilled dysphagia therapy to focus on strengthening and implementation of compensatory strategies. Recommend Home Health SLP for dysphagia therapy. Repeat MBSS in 4-6 weeks.    Treatment Recommendation  Therapy as outlined in treatment plan below    Diet Recommendation Dysphagia 3 (Mechanical Soft);Nectar-thick liquid   Liquid Administration via: Cup;No straw Medication Administration: Whole meds with puree Supervision: Intermittent supervision to cue for compensatory strategies;Patient able to self feed Compensations: Slow rate;Multiple dry swallows after each bite/sip;Effortful swallow Postural Changes and/or Swallow Maneuvers: Out of bed for meals;Seated  upright 90 degrees;Upright 30-60 min after meal    Other  Recommendations Oral Care  Recommendations: Oral care BID Other Recommendations: Order thickener from pharmacy;Clarify dietary restrictions   Follow Up Recommendations  Home health SLP    Frequency and Duration min 2x/week  1 week   Pertinent Vitals/Pain VSS    SLP Swallow Goals   Pt will demonstrate safe and efficient consumption of least restrictive diet with use of strategies as needed.    General Date of Onset: 01/29/14 HPI: [name] is a 79 y.o. male with PMH of HTN, h/o tobacco use, COPD presented with diarrhea for 3 days after eating at the restaurant found to have dehydration, AKI. COPD, h/o tobacco use; CXR: no acute findings; but COPD.moist non-productive cough particularly with drinking. Concern for aspiration. Will request ST bedside evaluation. cont inhalers as needed. Dementia: Possible diagnosis. Obvious significant problems with memory. Patient admits to "no memory". Currently lives alone with close "neighbor" support. Has had some falls.  Type of Study: Modified Barium Swallowing Study Reason for Referral: Objectively evaluate swallowing function Previous Swallow Assessment: BSE 5/21 with rec for MBSS Diet Prior to this Study: Regular;Thin liquids Temperature Spikes Noted: No Respiratory Status: Room air History of Recent Intubation: No Behavior/Cognition: Alert;Cooperative;Pleasant mood Oral Cavity - Dentition: Dentures, top Oral Motor / Sensory Function: Within functional limits Self-Feeding Abilities: Able to feed self Patient Positioning: Upright in chair Baseline Vocal Quality: Breathy;Hoarse Volitional Cough: Strong Volitional Swallow: Able to elicit Anatomy: Other (Comment) (Noted cervical osteophytes) Pharyngeal Secretions: Not observed secondary MBS    Reason for Referral Objectively evaluate swallowing function   Oral Phase Oral Preparation/Oral Phase Oral Phase: Impaired Oral - Solids Oral - Regular: Weak lingual manipulation;Piecemeal swallowing;Lingual/palatal  residue;Delayed oral transit   Pharyngeal Phase Pharyngeal Phase Pharyngeal Phase: Impaired Pharyngeal - Nectar Pharyngeal - Nectar Cup: Premature spillage to valleculae;Reduced epiglottic inversion;Reduced tongue base retraction;Lateral channel residue Pharyngeal - Thin Pharyngeal - Thin Cup: Delayed swallow initiation;Premature spillage to valleculae;Premature spillage to pyriform sinuses;Reduced epiglottic inversion;Reduced airway/laryngeal closure;Penetration/Aspiration during swallow;Penetration/Aspiration after swallow;Trace aspiration;Pharyngeal residue - valleculae;Lateral channel residue Penetration/Aspiration details (thin cup): Material does not enter airway;Material enters airway, remains ABOVE vocal cords then ejected out;Material enters airway, CONTACTS cords and not ejected out;Material enters airway, passes BELOW cords without attempt by patient to eject out (silent aspiration);Material enters airway, passes BELOW cords and not ejected out despite cough attempt by patient Pharyngeal - Solids Pharyngeal - Puree: Premature spillage to valleculae;Reduced epiglottic inversion;Reduced tongue base retraction;Pharyngeal residue - valleculae Pharyngeal - Mechanical Soft: Premature spillage to valleculae;Reduced epiglottic inversion;Reduced tongue base retraction;Pharyngeal residue - valleculae Pharyngeal - Regular: Premature spillage to valleculae;Reduced epiglottic inversion;Reduced tongue base retraction Pharyngeal - Pill: Premature spillage to valleculae;Reduced epiglottic inversion;Reduced tongue base retraction;Pharyngeal residue - valleculae Pharyngeal Phase - Comment Pharyngeal Comment: Decreased retroversion of epiglottis  Cervical Esophageal Phase    GO    Cervical Esophageal Phase Cervical Esophageal Phase: [facility]    Functional Assessment Tool Used: mbss Functional Limitations: Swallowing Swallow Current Status ([id number]): At least 20 percent but less than 40 percent  impaired, limited or restricted Swallow Goal Status [phone](G8997): At least 1 percent but less than 20 percent impaired, limited or restricted   Thank you,  Havery MorosDabney Porter, CCC-SLP [phone]  Dorene ArDabney V Porter 01/31/2014, 3:29 PM

## 2014-01-31 NOTE — Evaluation (Signed)
Clinical/Bedside Swallow Evaluation Patient Details  Name: Ryan [name] MRN: [id number] Date of Birth: 1935-08-05  Today's Date: 01/31/2014 Time: 4628-6381 SLP Time Calculation (min): 25 min  Past Medical History:  Past Medical History  Diagnosis Date  . Essential hypertension, benign      Hospital admission 10/13  . Abnormal ECG   . Type 2 diabetes mellitus   . Coronary atherosclerosis of native coronary artery     Based on Myoview - managing medically  . Dementia     Possible   Past Surgical History:  Past Surgical History  Procedure Laterality Date  . Hernia repair    . Prostate surgery    . Knee surgery    . Hip surgery     HPI:  [name] is a 79 y.o. male with PMH of HTN, h/o tobacco use, COPD presented with diarrhea for 3 days after eating at the restaurant found to have dehydration, AKI. COPD, h/o tobacco use; CXR: no acute findings; but COPD.moist non-productive cough particularly with drinking. Concern for aspiration. Will request ST bedside evaluation. cont inhalers as needed. Dementia: Possible diagnosis. Obvious significant problems with memory. Patient admits to "no memory". Currently lives alone with close "neighbor" support. Has had some falls.    Assessment / Plan / Recommendation Clinical Impression  [name] denies difficulty swallowing, but admits to frequent coughing after meals. Oral motor exam is unremarkable. Trials of regular textures yield lingual residue and and delayed coughing and throat clearing after the swallow. Will complete MBSS to rule out aspiration and identify appropriate swallowing strategies/diet as needed.     Aspiration Risk  Mild    Diet Recommendation  Pending MBSS later today       Other  Recommendations Recommended Consults: MBS   Follow Up Recommendations   (pending results of MBS)    Frequency and Duration        Pertinent Vitals/Pain VSS    SLP Swallow Goals  Pending MBSS results later  today.   Swallow Study Prior Functional Status  Type of Home: House    General Date of Onset: 01/29/14 HPI: [name] is a 79 y.o. male with PMH of HTN, h/o tobacco use, COPD presented with diarrhea for 3 days after eating at the restaurant found to have dehydration, AKI. COPD, h/o tobacco use; CXR: no acute findings; but COPD.moist non-productive cough particularly with drinking. Concern for aspiration. Will request ST bedside evaluation. cont inhalers as needed. Dementia: Possible diagnosis. Obvious significant problems with memory. Patient admits to "no memory". Currently lives alone with close "neighbor" support. Has had some falls.  Type of Study: Bedside swallow evaluation Previous Swallow Assessment: none on record Diet Prior to this Study: Regular;Thin liquids Temperature Spikes Noted: No Respiratory Status: Room air History of Recent Intubation: No Behavior/Cognition: Alert;Cooperative;Pleasant mood Oral Cavity - Dentition: Dentures, top Self-Feeding Abilities: Able to feed self Patient Positioning: Upright in chair Baseline Vocal Quality: Breathy;Hoarse Volitional Cough: Strong Volitional Swallow: Able to elicit    Oral/Motor/Sensory Function Overall Oral Motor/Sensory Function: Appears within functional limits for tasks assessed   Ice Chips Ice chips: Within functional limits   Thin Liquid Thin Liquid: Impaired Presentation: Cup;Spoon;Straw;Self Fed Pharyngeal  Phase Impairments: Throat Clearing - Delayed (seemed to be short of breath)    Nectar Thick Nectar Thick Liquid: Not tested   Honey Thick Honey Thick Liquid: Not tested   Puree Puree: Within functional limits Presentation: Spoon   Solid   GO Functional Assessment Tool Used: clinical  judgement Functional Limitations: Swallowing Swallow Current Status [phone](G8996): At least 20 percent but less than 40 percent impaired, limited or restricted Swallow Goal Status [phone](G8997): At least 1 percent but less than 20  percent impaired, limited or restricted  Solid: Impaired Presentation: Self Fed Oral Phase Functional Implications: Oral residue Pharyngeal Phase Impairments: Throat Clearing - Delayed;Cough - Delayed       Thank you,  Ryan [name], CCC-SLP [phone]  Ryan [name] Ryan [name] 01/31/2014,12:01 PM

## 2014-01-31 NOTE — Progress Notes (Signed)
Patient seen and examined. Above note reviewed.  Gastroenteritis has improved as has dehydration with IV fluids. Patient complains of significant coughing after eating/drinking.  Speech therapy is seeing patient and plans on MBSS today.  Anticipate discharge home in the next 24 hours  [employer]

## 2014-01-31 NOTE — Evaluation (Signed)
Physical Therapy Evaluation Patient Details Name: Ryan RouteJoseph W [name] MRN: [id number] DOB: 1934-12-14 Today's Date: 01/31/2014   History of Present Illness  78yo male adm 01/29/14 with possible gastroenteritis and AKI; PMHx:  HTN, COPD, possible dementia  Clinical Impression  Pt will benefit from PT to address deficits below; Should be able to return home with HHPT if progresses well, may need RW to improve stability, safety    Follow Up Recommendations Home health PT    Equipment Recommendations  Rolling walker with 5" wheels (depending on progress-has cane)    Recommendations for Other Services       Precautions / Restrictions        Mobility  Bed Mobility Overal bed mobility: Needs Assistance Bed Mobility: Supine to Sit     Supine to sit: Supervision;HOB elevated     General bed mobility comments: incr time, cues for use of UEs  Transfers Overall transfer level: Needs assistance Equipment used: Rolling walker (2 wheeled) Transfers: Sit to/from [employer]Stand;Stand Pivot Transfers Sit to Stand: Min guard Stand pivot transfers: Min guard (no AD)       General transfer comment: cues for hand placement and control of descent  Ambulation/Gait Ambulation/Gait assistance: Min guard Ambulation Distance (Feet): 120 Feet Assistive device: Rolling walker (2 wheeled) Gait Pattern/deviations: Step-through pattern;Narrow base of support;Trunk flexed     General Gait Details: verbal cues for RW safety and posture; one standing rest after ~ 65'  Stairs            Wheelchair Mobility    Modified Rankin (Stroke Patients Only)       Balance Overall balance assessment: Needs assistance Sitting-balance support: No upper extremity supported;Feet supported Sitting balance-Leahy Scale: Good       Standing balance-Leahy Scale: Fair (to Good)               High level balance activites: Turns;Backward walking;Head turns               Pertinent Vitals/Pain  Denies pain    Home Living Family/patient expects to be discharged to:: Private residence Living Arrangements: Alone   Type of Home: House Home Access: Stairs to enter   Secretary/administratorntrance Stairs-Number of Steps: 3 Home Layout: One level Home Equipment: Cane - single point      Prior Function                 Hand Dominance        Extremity/Trunk Assessment   Upper Extremity Assessment: Generalized weakness           Lower Extremity Assessment: Generalized weakness         Communication      Cognition Arousal/Alertness: Awake/alert Behavior During Therapy: WFL for tasks assessed/performed Overall Cognitive Status: Within Functional Limits for tasks assessed                      General Comments      Exercises        Assessment/Plan    PT Assessment Patient needs continued PT services  PT Diagnosis Difficulty walking   PT Problem List Decreased activity tolerance;Decreased balance;Decreased mobility;Decreased knowledge of use of DME  PT Treatment Interventions DME instruction;Gait training;Functional mobility training;Therapeutic activities;Therapeutic exercise;Patient/family education;Balance training   PT Goals (Current goals can be found in the Care Plan section) Acute Rehab PT Goals Patient Stated Goal: home when better PT Goal Formulation: With patient Time For Goal Achievement: 02/14/14 Potential to Achieve Goals: Good  Frequency Min 3X/week   Barriers to discharge        Co-evaluation               End of Session Equipment Utilized During Treatment: Gait belt Activity Tolerance: Patient tolerated treatment well Patient left: in chair;with chair alarm set;with call bell/phone within reach      Functional Assessment Tool Used: clinical judgement Functional Limitation: Mobility: Walking and moving around Mobility: Walking and Moving Around Current Status (W6568): At least 1 percent but less than 20 percent impaired, limited  or restricted Mobility: Walking and Moving Around Goal Status [phone]): At least 1 percent but less than 20 percent impaired, limited or restricted    Time: 0933-1003 PT Time Calculation (min): 30 min   Charges:   PT Evaluation $Initial PT Evaluation Tier I: 1 Procedure PT Treatments $Gait Training: 23-37 mins   PT G Codes:   Functional Assessment Tool Used: clinical judgement Functional Limitation: Mobility: Walking and moving around    [employer] 01/31/2014, 10:17 AM

## 2014-02-01 DIAGNOSIS — F0390 Unspecified dementia without behavioral disturbance: Secondary | ICD-10-CM | POA: Diagnosis not present

## 2014-02-01 DIAGNOSIS — N179 Acute kidney failure, unspecified: Secondary | ICD-10-CM | POA: Diagnosis not present

## 2014-02-01 DIAGNOSIS — R197 Diarrhea, unspecified: Secondary | ICD-10-CM | POA: Diagnosis not present

## 2014-02-01 DIAGNOSIS — E860 Dehydration: Secondary | ICD-10-CM | POA: Diagnosis not present

## 2014-02-01 LAB — BASIC METABOLIC PANEL
BUN: 10 mg/dL (ref 6–23)
CHLORIDE: 107 meq/L (ref 96–112)
CO2: 25 meq/L (ref 19–32)
Calcium: 8.3 mg/dL — ABNORMAL LOW (ref 8.4–10.5)
Creatinine, Ser: 0.82 mg/dL (ref 0.50–1.35)
GFR calc non Af Amer: 84 mL/min — ABNORMAL LOW (ref >90)
GLUCOSE: 116 mg/dL — AB (ref 70–99)
POTASSIUM: 3.6 meq/L — AB (ref 3.7–5.3)
Sodium: 141 mEq/L (ref 137–147)

## 2014-02-01 LAB — GLUCOSE, CAPILLARY
GLUCOSE-CAPILLARY: 130 mg/dL — AB (ref 70–99)
GLUCOSE-CAPILLARY: 117 mg/dL — AB (ref 70–99)
GLUCOSE-CAPILLARY: 135 mg/dL — AB (ref 70–99)

## 2014-02-01 LAB — CBC
HEMATOCRIT: 31.6 % — AB (ref 39.0–52.0)
HEMOGLOBIN: 10.7 g/dL — AB (ref 13.0–17.0)
MCH: 30.2 pg (ref 26.0–34.0)
MCHC: 33.9 g/dL (ref 30.0–36.0)
MCV: 89.3 fL (ref 78.0–100.0)
PLATELETS: 113 10*3/uL — AB (ref 150–400)
RBC: 3.54 MIL/uL — ABNORMAL LOW (ref 4.22–5.81)
RDW: 13.1 % (ref 11.5–15.5)
WBC: 7.6 10*3/uL (ref 4.0–10.5)

## 2014-02-01 LAB — FOLATE RBC: RBC Folate: 539 ng/mL (ref >280)

## 2014-02-01 MED ORDER — AMLODIPINE BESYLATE 5 MG PO TABS: 5.0000 mg | ORAL_TABLET | Freq: Every day | ORAL | Status: DC

## 2014-02-01 NOTE — Progress Notes (Signed)
Patient lying in bed resting quietly. No complaints voiced at this time.

## 2014-02-01 NOTE — Discharge Summary (Signed)
Patient seen and examined. Agree with note as above.  Patient admitted with viral gastroenteritis with resulting dehydration and renal failure.  He has improved with IV hydration and supportive care. He no longer has any GI symptoms.  He was having some dysphagia and was seen by physical therapy.  Dysphagia 3 diet with nectar think liquids were recommended. He has been seen by physical therapy and recommendations are for home health physical therapy. Will request home health social worker to assist with placement needs as they arise, especially with dementia.  Patient is stable for discharge home.  [employer]

## 2014-02-01 NOTE — Progress Notes (Signed)
Patient IV removed. Discharge instructions reviewed with patient with teach back. Patient verbalized understanding. Calling [facility] at this time to come pick him up.

## 2014-02-01 NOTE — Discharge Summary (Signed)
Physician Discharge Summary  Ryan [name] WUJ:[id number] DOB: 23-Oct-1934 DOA: 01/29/2014  PCP: Ignatius Specking., MD  Admit date: 01/29/2014 Discharge date: 02/01/2014  Time spent: 40 minutes  Recommendations for Outpatient Follow-up:  1. Follow up with PCP 1 week for evaluation of symptoms. Consider repeat MBSS 4-6 weeks after speech therapy for dysphagia.  Patient with severe memory issues. May benefit from assisted living.  2.  Home health Speech therapy for dysphagia and HH PT for strength and endurance.   Discharge Diagnoses:  Principal Problem:   Acute kidney injury Active Problems:   Type 2 diabetes mellitus   Coronary atherosclerosis of native coronary artery   Essential hypertension, benign   Dementia   HTN (hypertension)   Diarrhea   AKI (acute kidney injury)   Dysphagia, unspecified(787.20)   Discharge Condition: stable  Diet recommendation: mechanical soft, nectar-thick liquid  Filed Weights   01/29/14 1605 01/30/14 0012 01/31/14 0536  Weight: 81.647 kg (180 lb) 79.6 kg (175 lb 7.8 oz) 80 kg (176 lb 5.9 oz)    History of present illness:  Ryan [name] is a 79 y.o. male with PMH of HTN, h/o tobacco use, COPD presented on 01/29/14 with diarrhea for 3 days, associated with chills, nausea but no vomiting; He reportrf eating at the restaurant at class reunion on Thursday then developed diarrhea on Friday; denief abdominal pain, no hematochezia, no hematemesis; he haf mild dizziness, no focal neurological weakness; denief chest pain, no SOB, has chronic cough.-patient does not take any medications at home    Hospital Course:  1. Diarrhea/Nausea likely gastroenteritis; vs food poisoning; no further diarrhea during hospitalization. Provided with bowel rest, IV fluids, anti-emetics and clear liquids. Quickly improved. Diet advanced. Remained afebrile, no leukocytosis. KUB unremarkable. At discharge tolerating a soft mechanical diet. Recommend followup with PCP 1-2  weeks for evaluation of symptoms 2. AKI likely prerenal due to diarrhea; resolved with IV fluids. Urine cultures unremarkable: clinically not suspicious for UTI  3. COPD, h/o tobacco use; CXR with COPD changes. No acute changes. Moist  non-productive cough particularly with drinking. Concern for aspiration. See below.  Cont inhalers as needed  4. DM 2: HgA1c 6.6. Diet controlled at home.   5. Dysphagia: patient with frequent cough with drinking and clearing of throat with liquids. MBSS reveals mild oral phase dysphagia; Mild pharyngeal phase dysphagia;Moderate pharyngeal phase dysphagia. Recommending mechanical soft nectar thick diet, no straws, intermittent supervision to cue for compensatory strategies, slow rate, multiple dry swallows after each bite, seated 90%,upright 30-60 minutes after eating.  6. Dementia: Possible diagnosis. Obvious significant problems with memory. Patient admits to "no memory". Currently lives alone with close "neighbor" support. Has had some falls. Will request  HH PT. May benefit from assisted living    Procedures:  MBSS 01/31/14  Consultations:  none  Discharge Exam: Filed Vitals:   02/01/14 0700  BP: 150/57  Pulse: 51  Temp: 97.7 F (36.5 C)  Resp: 20    General: thin somewhat frail NAD Cardiovascular: RRR No MGR No LE edema Respiratory: normal effort BS clear but coarse Abdomen: flat soft +BS non-tender to palpation  Discharge Instructions You were cared for by a hospitalist during your hospital stay. If you have any questions about your discharge medications or the care you received while you were in the hospital after you are discharged, you can call the unit and asked to speak with the hospitalist on call if the hospitalist that took care of you is not available. Once you  are discharged, your primary care physician will handle any further medical issues. Please note that NO REFILLS for any discharge medications will be authorized once you are  discharged, as it is imperative that you return to your primary care physician (or establish a relationship with a primary care physician if you do not have one) for your aftercare needs so that they can reassess your need for medications and monitor your lab values.     Medication List         amLODipine 5 MG tablet  Commonly known as:  NORVASC  Take 1 tablet (5 mg total) by mouth daily.     aspirin EC 81 MG tablet  Take 81 mg by mouth at bedtime.     cyanocobalamin 500 MCG tablet  Take 500 mcg by mouth daily.     Garlic 1000 MG Caps  Take 3 capsules by mouth every morning.     RED YEAST RICE PO  Take 1 capsule by mouth 2 (two) times daily.     vitamin C 1000 MG tablet  Take 1,000 mg by mouth daily.       Allergies  Allergen Reactions  . Lisinopril Cough       Follow-up Information   Follow up with VYAS,DHRUV B., MD. Schedule an appointment as soon as possible for a visit in 1 week. (follow up on symptoms i.e. nausea/vomiting/diarrhea, [facility]. [facility] for [facility]. )    Specialty:  Internal Medicine   Contact information:   [address] [place] [id number] [phone]        The results of significant diagnostics from this hospitalization (including imaging, microbiology, ancillary and laboratory) are listed below for reference.    Significant Diagnostic Studies: Dg Chest 2 View  01/29/2014   CLINICAL DATA:  Weakness, headache, diarrhea, vomiting, and chest discomfort today, history dementia, hypertension, diabetes, former smoker  EXAM: CHEST  2 VIEW  COMPARISON:  07/07/2012  FINDINGS: Normal heart size, mediastinal contours and pulmonary vascularity.  Atherosclerotic calcification aorta.  Emphysematous changes without infiltrate, pleural effusion, or pneumothorax.  Bones unremarkable.  IMPRESSION: COPD changes.  No acute abnormalities.   Electronically Signed   By: Ulyses SouthwardMark  Boles M.D.   On: 01/29/2014 18:21   Dg Abd 1  View  01/30/2014   CLINICAL DATA:  Persistent nausea and vomiting  EXAM: ABDOMEN - 1 VIEW  COMPARISON:  None  FINDINGS: Normal bowel gas pattern.  Gas in rectum.  No bowel dilatation or bowel wall thickening.  Osseous demineralization with degenerative changes lumbar spine.  Numerous pelvic phleboliths.  No urinary tract calcification.  IMPRESSION: Normal bowel gas pattern.   Electronically Signed   By: Ulyses SouthwardMark  Boles M.D.   On: 01/30/2014 12:51   Dg Swallowing Func-speech Pathology  01/31/2014   Dorene Arabney V Porter, CCC-SLP     01/31/2014  3:30 PM Objective Swallowing Evaluation: Modified Barium Swallowing Study    Patient Details  Name: Ryan RouteJoseph W [name] MRN: [id number] Date of Birth: 02/08/1935  Today's Date: 01/31/2014 Time: [id number] SLP Time Calculation (min): 29 min  Past Medical History:  Past Medical History  Diagnosis Date  . Essential hypertension, benign      Hospital admission 10/13  . Abnormal ECG   . Type 2 diabetes mellitus   . Coronary atherosclerosis of native coronary artery     Based on Myoview - managing medically  . Dementia     Possible   Past Surgical History:  Past Surgical  History  Procedure Laterality Date  . Hernia repair    . Prostate surgery    . Knee surgery    . Hip surgery     HPI:  Ryan [name] is a 79 y.o. male with PMH of HTN, h/o  tobacco use, COPD presented with diarrhea for 3 days after eating  at the restaurant found to have dehydration, AKI. COPD, h/o  tobacco use; CXR: no acute findings; but COPD.moist  non-productive cough particularly with drinking. Concern for  aspiration. Will request ST bedside evaluation. cont inhalers as  needed. Dementia: Possible diagnosis. Obvious significant  problems with memory. Patient admits to "no memory". Currently  lives alone with close "neighbor" support. Has had some falls.      Assessment / Plan / Recommendation Clinical Impression  Dysphagia Diagnosis: Mild oral phase dysphagia;Mild pharyngeal  phase dysphagia;Moderate pharyngeal  phase dysphagia Clinical impression: MBSS completed this date due to pt showing  soft signs of aspiration at bedside. Ryan [name] presents with  mild oral phase and mi/mod pharyngeal phase dsyphagia  characterized by weak lingual manipulation, prolonged oral  transit, piecemeal deglutition, premature spillage with slight  delay in swallow initiation, decreased tongue base retraction and  epiglottic deflection resulting in lingual residue of solids,  vallecular residue of puree, pill, and liquids, variable trace  penetration and aspiration of thins during and after the swallow  due to residuals in pharynx. Pt cleared throat spontaneously  several times, but only coughed when cued in attempt to clear  aspirate from anterior tracheal wall (ineffective). Pt seemingly  has a strong cough, but was unable to remove aspirate or what  remained in laryngeal vestibule. Penetration/aspiration was not  in gross amounts, however over the course of a meal/day I would  suspect could be problematic. When results were reviewed with pt,  he was befuddled and asked why he wasn't "getting strangled" and  SLP assured him that all of the throat clearing and delayed  couging after meals are signs of aspiration. When pt swallowed  the barium tablet, it dropped to the valleculae and remained  until he was cued to take a few bites of applesauce which cleared  pill from valleculae. It should be noted that pt initially could  tell that "something was hung" when the pill stopped in  valleculae, however he reported that it had cleared when it had  not. Recommend diet modifcation to mechanical soft and  nectar-thick liquids. Cue pt to "swallow hard" and swallow 2x  with each bite/sip. Prognosis for upgrade to thin is good with  skilled dysphagia therapy to focus on strengthening and  implementation of compensatory strategies. Recommend Home Health  SLP for dysphagia therapy. Repeat MBSS in 4-6 weeks.    Treatment Recommendation  Therapy as  outlined in treatment plan below    Diet Recommendation Dysphagia 3 (Mechanical Soft);Nectar-thick  liquid   Liquid Administration via: Cup;No straw Medication Administration: Whole meds with puree Supervision: Intermittent supervision to cue for compensatory  strategies;Patient able to self feed Compensations: Slow rate;Multiple dry swallows after each  bite/sip;Effortful swallow Postural Changes and/or Swallow Maneuvers: Out of bed for  meals;Seated upright 90 degrees;Upright 30-60 min after meal    Other  Recommendations Oral Care Recommendations: Oral care BID Other Recommendations: Order thickener from pharmacy;Clarify  dietary restrictions   Follow Up Recommendations  Home health SLP    Frequency and Duration min 2x/week  1 week   Pertinent Vitals/Pain VSS    SLP Swallow Goals  Pt will demonstrate safe and efficient consumption of least  restrictive diet with use of strategies as needed.    General Date of Onset: 01/29/14 HPI: Mr. Cordarious Wigen is a 79 y.o. male with PMH of HTN, h/o  tobacco use, COPD presented with diarrhea for 3 days after eating  at the restaurant found to have dehydration, AKI. COPD, h/o  tobacco use; CXR: no acute findings; but COPD.moist  non-productive cough particularly with drinking. Concern for  aspiration. Will request ST bedside evaluation. cont inhalers as  needed. Dementia: Possible diagnosis. Obvious significant  problems with memory. Patient admits to "no memory". Currently  lives alone with close "neighbor" support. Has had some falls.  Type of Study: Modified Barium Swallowing Study Reason for Referral: Objectively evaluate swallowing function Previous Swallow Assessment: BSE 5/21 with rec for MBSS Diet Prior to this Study: Regular;Thin liquids Temperature Spikes Noted: No Respiratory Status: Room air History of Recent Intubation: No Behavior/Cognition: Alert;Cooperative;Pleasant mood Oral Cavity - Dentition: Dentures, top Oral Motor / Sensory Function: Within functional  limits Self-Feeding Abilities: Able to feed self Patient Positioning: Upright in chair Baseline Vocal Quality: Breathy;Hoarse Volitional Cough: Strong Volitional Swallow: Able to elicit Anatomy: Other (Comment) (Noted cervical osteophytes) Pharyngeal Secretions: Not observed secondary MBS    Reason for Referral Objectively evaluate swallowing function   Oral Phase Oral Preparation/Oral Phase Oral Phase: Impaired Oral - Solids Oral - Regular: Weak lingual manipulation;Piecemeal  swallowing;Lingual/palatal residue;Delayed oral transit   Pharyngeal Phase Pharyngeal Phase Pharyngeal Phase: Impaired Pharyngeal - Nectar Pharyngeal - Nectar Cup: Premature spillage to valleculae;Reduced  epiglottic inversion;Reduced tongue base retraction;Lateral  channel residue Pharyngeal - Thin Pharyngeal - Thin Cup: Delayed swallow initiation;Premature  spillage to valleculae;Premature spillage to pyriform  sinuses;Reduced epiglottic inversion;Reduced airway/laryngeal  closure;Penetration/Aspiration during  swallow;Penetration/Aspiration after swallow;Trace  aspiration;Pharyngeal residue - valleculae;Lateral channel  residue Penetration/Aspiration details (thin cup): Material does not  enter airway;Material enters airway, remains ABOVE vocal cords  then ejected out;Material enters airway, CONTACTS cords and not  ejected out;Material enters airway, passes BELOW cords without  attempt by patient to eject out (silent aspiration);Material  enters airway, passes BELOW cords and not ejected out despite  cough attempt by patient Pharyngeal - Solids Pharyngeal - Puree: Premature spillage to valleculae;Reduced  epiglottic inversion;Reduced tongue base retraction;Pharyngeal  residue - valleculae Pharyngeal - Mechanical Soft: Premature spillage to  valleculae;Reduced epiglottic inversion;Reduced tongue base  retraction;Pharyngeal residue - valleculae Pharyngeal - Regular: Premature spillage to valleculae;Reduced  epiglottic inversion;Reduced  tongue base retraction Pharyngeal - Pill: Premature spillage to valleculae;Reduced  epiglottic inversion;Reduced tongue base retraction;Pharyngeal  residue - valleculae Pharyngeal Phase - Comment Pharyngeal Comment: Decreased retroversion of epiglottis  Cervical Esophageal Phase    GO    Cervical Esophageal Phase Cervical Esophageal Phase: [facility]    Functional Assessment Tool Used: mbss Functional Limitations: Swallowing Swallow Current Status (L3734): At least 20 percent but less than  40 percent impaired, limited or restricted Swallow Goal Status [phone]): At least 1 percent but less than 20  percent impaired, limited or restricted   Thank you,  Havery Moros, CCC-SLP [phone]  Dorene Ar 01/31/2014, 3:29 PM     Microbiology: Recent Results (from the past 240 hour(s))  CULTURE, BLOOD (ROUTINE X 2)     Status: None   Collection Time    01/29/14  4:55 PM      Result Value Ref Range Status   Specimen Description BLOOD RIGHT ANTECUBITAL   Final   Special Requests     Final  Value: BOTTLES DRAWN AEROBIC AND ANAEROBIC AEB=12CC ANA=16CC   Culture NO GROWTH 2 DAYS   Final   Report Status PENDING   Incomplete  CULTURE, BLOOD (ROUTINE X 2)     Status: None   Collection Time    01/29/14  5:05 PM      Result Value Ref Range Status   Specimen Description BLOOD RIGHT HAND   Final   Special Requests     Final   Value: BOTTLES DRAWN AEROBIC AND ANAEROBIC AEB=16CC ANA=10CC   Culture NO GROWTH 2 DAYS   Final   Report Status PENDING   Incomplete  URINE CULTURE     Status: None   Collection Time    01/29/14  6:50 PM      Result Value Ref Range Status   Specimen Description URINE, CLEAN CATCH   Final   Special Requests Normal   Final   Culture  Setup Time     Final   Value: 01/29/2014 23:24     Performed at [facility] Count     Final   Value: >=100,000 COLONIES/ML     Performed at [facility]   Culture     Final   Value: Multiple bacterial morphotypes present, none  predominant. Suggest appropriate recollection if clinically indicated.     Performed at [facility]   Report Status 01/30/2014 FINAL   Final     Labs: Basic Metabolic Panel:  Recent Labs Lab 01/29/14 1652 01/30/14 0609 01/31/14 0548 02/01/14 0635  NA 137 143 143 141  K 4.1 4.1 3.8 3.6*  CL 100 108 108 107  CO2 19 22 24 25   GLUCOSE 161* 122* 110* 116*  BUN 48* 32* 17 10  CREATININE 1.55* 1.03 0.85 0.82  CALCIUM 9.2 8.5 8.5 8.3*   Liver Function Tests:  Recent Labs Lab 01/29/14 1652  AST 40*  ALT 25  ALKPHOS 50  BILITOT 1.0  PROT 7.2  ALBUMIN 3.1*   No results found for this basename: LIPASE, AMYLASE,  in the last 168 hours No results found for this basename: AMMONIA,  in the last 168 hours CBC:  Recent Labs Lab 01/29/14 1652 01/31/14 0548 02/01/14 0635  WBC 9.6 5.5 7.6  NEUTROABS 7.8*  --   --   HGB 14.0 11.5* 10.7*  HCT 40.6 34.4* 31.6*  MCV 89.0 91.2 89.3  PLT 98* 93* 113*   Cardiac Enzymes: No results found for this basename: CKTOTAL, CKMB, CKMBINDEX, TROPONINI,  in the last 168 hours BNP: BNP (last 3 results) No results found for this basename: PROBNP,  in the last 8760 hours CBG:  Recent Labs Lab 01/31/14 0756 01/31/14 1130 01/31/14 1610 01/31/14 2105 02/01/14 0729  GLUCAP 98 130* 139* 130* 117*       Signed:  Lesle Chris Black  [facility] 02/01/2014, 10:30 AM

## 2014-02-03 DIAGNOSIS — I2510 Atherosclerotic heart disease of native coronary artery without angina pectoris: Secondary | ICD-10-CM | POA: Diagnosis not present

## 2014-02-03 DIAGNOSIS — I10 Essential (primary) hypertension: Secondary | ICD-10-CM | POA: Diagnosis not present

## 2014-02-03 DIAGNOSIS — F0390 Unspecified dementia without behavioral disturbance: Secondary | ICD-10-CM | POA: Diagnosis not present

## 2014-02-03 DIAGNOSIS — J449 Chronic obstructive pulmonary disease, unspecified: Secondary | ICD-10-CM | POA: Diagnosis not present

## 2014-02-03 DIAGNOSIS — R1310 Dysphagia, unspecified: Secondary | ICD-10-CM | POA: Diagnosis not present

## 2014-02-03 DIAGNOSIS — E119 Type 2 diabetes mellitus without complications: Secondary | ICD-10-CM | POA: Diagnosis not present

## 2014-02-03 LAB — CULTURE, BLOOD (ROUTINE X 2)
CULTURE: NO GROWTH
Culture: NO GROWTH

## 2014-02-04 DIAGNOSIS — R1310 Dysphagia, unspecified: Secondary | ICD-10-CM | POA: Diagnosis not present

## 2014-02-04 DIAGNOSIS — I10 Essential (primary) hypertension: Secondary | ICD-10-CM | POA: Diagnosis not present

## 2014-02-04 DIAGNOSIS — F0390 Unspecified dementia without behavioral disturbance: Secondary | ICD-10-CM | POA: Diagnosis not present

## 2014-02-04 DIAGNOSIS — E119 Type 2 diabetes mellitus without complications: Secondary | ICD-10-CM | POA: Diagnosis not present

## 2014-02-04 DIAGNOSIS — J449 Chronic obstructive pulmonary disease, unspecified: Secondary | ICD-10-CM | POA: Diagnosis not present

## 2014-02-04 DIAGNOSIS — I2510 Atherosclerotic heart disease of native coronary artery without angina pectoris: Secondary | ICD-10-CM | POA: Diagnosis not present

## 2014-02-05 DIAGNOSIS — F0390 Unspecified dementia without behavioral disturbance: Secondary | ICD-10-CM | POA: Diagnosis not present

## 2014-02-05 DIAGNOSIS — I2510 Atherosclerotic heart disease of native coronary artery without angina pectoris: Secondary | ICD-10-CM | POA: Diagnosis not present

## 2014-02-05 DIAGNOSIS — J449 Chronic obstructive pulmonary disease, unspecified: Secondary | ICD-10-CM | POA: Diagnosis not present

## 2014-02-05 DIAGNOSIS — E119 Type 2 diabetes mellitus without complications: Secondary | ICD-10-CM | POA: Diagnosis not present

## 2014-02-05 DIAGNOSIS — I10 Essential (primary) hypertension: Secondary | ICD-10-CM | POA: Diagnosis not present

## 2014-02-05 DIAGNOSIS — R1310 Dysphagia, unspecified: Secondary | ICD-10-CM | POA: Diagnosis not present

## 2014-02-06 DIAGNOSIS — I10 Essential (primary) hypertension: Secondary | ICD-10-CM | POA: Diagnosis not present

## 2014-02-06 DIAGNOSIS — I2510 Atherosclerotic heart disease of native coronary artery without angina pectoris: Secondary | ICD-10-CM | POA: Diagnosis not present

## 2014-02-06 DIAGNOSIS — F0390 Unspecified dementia without behavioral disturbance: Secondary | ICD-10-CM | POA: Diagnosis not present

## 2014-02-06 DIAGNOSIS — J449 Chronic obstructive pulmonary disease, unspecified: Secondary | ICD-10-CM | POA: Diagnosis not present

## 2014-02-06 DIAGNOSIS — R1310 Dysphagia, unspecified: Secondary | ICD-10-CM | POA: Diagnosis not present

## 2014-02-06 DIAGNOSIS — E119 Type 2 diabetes mellitus without complications: Secondary | ICD-10-CM | POA: Diagnosis not present

## 2014-02-07 DIAGNOSIS — R1310 Dysphagia, unspecified: Secondary | ICD-10-CM | POA: Diagnosis not present

## 2014-02-07 DIAGNOSIS — I10 Essential (primary) hypertension: Secondary | ICD-10-CM | POA: Diagnosis not present

## 2014-02-07 DIAGNOSIS — J449 Chronic obstructive pulmonary disease, unspecified: Secondary | ICD-10-CM | POA: Diagnosis not present

## 2014-02-07 DIAGNOSIS — I2510 Atherosclerotic heart disease of native coronary artery without angina pectoris: Secondary | ICD-10-CM | POA: Diagnosis not present

## 2014-02-07 DIAGNOSIS — F0390 Unspecified dementia without behavioral disturbance: Secondary | ICD-10-CM | POA: Diagnosis not present

## 2014-02-07 DIAGNOSIS — E119 Type 2 diabetes mellitus without complications: Secondary | ICD-10-CM | POA: Diagnosis not present

## 2014-02-08 ENCOUNTER — Telehealth: Payer: Self-pay | Admitting: Family Medicine

## 2014-02-11 NOTE — Telephone Encounter (Signed)
appt given for wed at 8:30 with mmm

## 2014-02-12 DIAGNOSIS — J449 Chronic obstructive pulmonary disease, unspecified: Secondary | ICD-10-CM | POA: Diagnosis not present

## 2014-02-12 DIAGNOSIS — E119 Type 2 diabetes mellitus without complications: Secondary | ICD-10-CM | POA: Diagnosis not present

## 2014-02-12 DIAGNOSIS — I2510 Atherosclerotic heart disease of native coronary artery without angina pectoris: Secondary | ICD-10-CM | POA: Diagnosis not present

## 2014-02-12 DIAGNOSIS — R1310 Dysphagia, unspecified: Secondary | ICD-10-CM | POA: Diagnosis not present

## 2014-02-12 DIAGNOSIS — I10 Essential (primary) hypertension: Secondary | ICD-10-CM | POA: Diagnosis not present

## 2014-02-12 DIAGNOSIS — F0390 Unspecified dementia without behavioral disturbance: Secondary | ICD-10-CM | POA: Diagnosis not present

## 2014-02-13 ENCOUNTER — Encounter: Payer: Self-pay | Admitting: Nurse Practitioner

## 2014-02-13 ENCOUNTER — Ambulatory Visit (INDEPENDENT_AMBULATORY_CARE_PROVIDER_SITE_OTHER): Payer: Medicare Other | Admitting: Nurse Practitioner

## 2014-02-13 VITALS — BP 166/78 | HR 74 | Temp 97.6°F | Ht 75.0 in | Wt 172.0 lb

## 2014-02-13 DIAGNOSIS — Z09 Encounter for follow-up examination after completed treatment for conditions other than malignant neoplasm: Secondary | ICD-10-CM | POA: Diagnosis not present

## 2014-02-13 DIAGNOSIS — E860 Dehydration: Secondary | ICD-10-CM

## 2014-02-13 NOTE — Progress Notes (Signed)
   Subjective:    Patient ID: Ryan [name], male    DOB: [birth year], 79 y.o.   MRN: [id number]  HPI Patient in today for hospital follow up- Started getting sick on May 17 with nausea and diarrhea- Went to [facility] and was dehydrated and was given fluids. They admittted him and gave him a clear liquid diet. For 24 hours then full. Was sent home with home health services. He is doing much better- tryng to regain his strength.    Review of Systems  Constitutional: Negative.   HENT: Negative.   Respiratory: Negative.  Negative for shortness of breath.   Cardiovascular: Negative.   Gastrointestinal: Negative.   Genitourinary: Negative.   Neurological: Negative.   Psychiatric/Behavioral: Negative.   All other systems reviewed and are negative.      Objective:   Physical Exam  Constitutional: He is oriented to person, place, and time. He appears well-developed and well-nourished.  Cardiovascular: Normal rate, regular rhythm and normal heart sounds.   Pulmonary/Chest: Effort normal and breath sounds normal.  Neurological: He is alert and oriented to person, place, and time.  Skin: Skin is warm and dry.  Psychiatric: He has a normal mood and affect. His behavior is normal. Judgment and thought content normal.    BP 166/78  Pulse 74  Temp(Src) 97.6 F (36.4 C) (Oral)  Ht 6\' 3"  (1.905 m)  Wt 172 lb (78.019 kg)  BMI 21.50 kg/m2       Assessment & Plan:   1. Hospital discharge follow-up   2. Dehydration    Forced fluids Rest RTO prn  Mary-Margaret Daphine Deutscher, FNP

## 2014-02-13 NOTE — Patient Instructions (Signed)
Dehydration, Adult Dehydration is when you lose more fluids from the body than you take in. Vital organs like the kidneys, brain, and heart cannot function without a proper amount of fluids and salt. Any loss of fluids from the body can cause dehydration.  CAUSES   Vomiting.  Diarrhea.  Excessive sweating.  Excessive urine output.  Fever. SYMPTOMS  Mild dehydration  Thirst.  Dry lips.  Slightly dry mouth. Moderate dehydration  Very dry mouth.  Sunken eyes.  Skin does not bounce back quickly when lightly pinched and released.  Dark urine and decreased urine production.  Decreased tear production.  Headache. Severe dehydration  Very dry mouth.  Extreme thirst.  Rapid, weak pulse (more than 100 beats per minute at rest).  Cold hands and feet.  Not able to sweat in spite of heat and temperature.  Rapid breathing.  Blue lips.  Confusion and lethargy.  Difficulty being awakened.  Minimal urine production.  No tears. DIAGNOSIS  Your caregiver will diagnose dehydration based on your symptoms and your exam. Blood and urine tests will help confirm the diagnosis. The diagnostic evaluation should also identify the cause of dehydration. TREATMENT  Treatment of mild or moderate dehydration can often be done at home by increasing the amount of fluids that you drink. It is best to drink small amounts of fluid more often. Drinking too much at one time can make vomiting worse. Refer to the home care instructions below. Severe dehydration needs to be treated at the hospital where you will probably be given intravenous (IV) fluids that contain water and electrolytes. HOME CARE INSTRUCTIONS   Ask your caregiver about specific rehydration instructions.  Drink enough fluids to keep your urine clear or pale yellow.  Drink small amounts frequently if you have nausea and vomiting.  Eat as you normally do.  Avoid:  Foods or drinks high in sugar.  Carbonated  drinks.  Juice.  Extremely hot or cold fluids.  Drinks with caffeine.  Fatty, greasy foods.  Alcohol.  Tobacco.  Overeating.  Gelatin desserts.  Wash your hands well to avoid spreading bacteria and viruses.  Only take over-the-counter or prescription medicines for pain, discomfort, or fever as directed by your caregiver.  Ask your caregiver if you should continue all prescribed and over-the-counter medicines.  Keep all follow-up appointments with your caregiver. SEEK MEDICAL CARE IF:  You have abdominal pain and it increases or stays in one area (localizes).  You have a rash, stiff neck, or severe headache.  You are irritable, sleepy, or difficult to awaken.  You are weak, dizzy, or extremely thirsty. SEEK IMMEDIATE MEDICAL CARE IF:   You are unable to keep fluids down or you get worse despite treatment.  You have frequent episodes of vomiting or diarrhea.  You have blood or green matter (bile) in your vomit.  You have blood in your stool or your stool looks black and tarry.  You have not urinated in 6 to 8 hours, or you have only urinated a small amount of very dark urine.  You have a fever.  You faint. MAKE SURE YOU:   Understand these instructions.  Will watch your condition.  Will get help right away if you are not doing well or get worse. Document Released: 08/30/2005 Document Revised: 11/22/2011 Document Reviewed: 04/19/2011 Exit[employer] Patient Information 2014 [employer].  

## 2014-02-14 DIAGNOSIS — I2510 Atherosclerotic heart disease of native coronary artery without angina pectoris: Secondary | ICD-10-CM | POA: Diagnosis not present

## 2014-02-14 DIAGNOSIS — R1310 Dysphagia, unspecified: Secondary | ICD-10-CM | POA: Diagnosis not present

## 2014-02-14 DIAGNOSIS — F0390 Unspecified dementia without behavioral disturbance: Secondary | ICD-10-CM | POA: Diagnosis not present

## 2014-02-14 DIAGNOSIS — J449 Chronic obstructive pulmonary disease, unspecified: Secondary | ICD-10-CM | POA: Diagnosis not present

## 2014-02-14 DIAGNOSIS — I10 Essential (primary) hypertension: Secondary | ICD-10-CM | POA: Diagnosis not present

## 2014-02-14 DIAGNOSIS — E119 Type 2 diabetes mellitus without complications: Secondary | ICD-10-CM | POA: Diagnosis not present

## 2014-02-14 LAB — CMP14+EGFR
ALT: 15 IU/L (ref 0–44)
AST: 18 IU/L (ref 0–40)
Albumin/Globulin Ratio: 1.1 (ref 1.1–2.5)
Albumin: 3.5 g/dL (ref 3.5–4.8)
Alkaline Phosphatase: 64 IU/L (ref 39–117)
BUN/Creatinine Ratio: 20 (ref 10–22)
BUN: 20 mg/dL (ref 8–27)
CALCIUM: 9.3 mg/dL (ref 8.6–10.2)
CHLORIDE: 103 mmol/L (ref 97–108)
CO2: 23 mmol/L (ref 18–29)
Creatinine, Ser: 1.01 mg/dL (ref 0.76–1.27)
GFR calc Af Amer: 83 mL/min/{1.73_m2} (ref >59)
GFR, EST NON AFRICAN AMERICAN: 72 mL/min/{1.73_m2} (ref >59)
Globulin, Total: 3.2 g/dL (ref 1.5–4.5)
Glucose: 146 mg/dL — ABNORMAL HIGH (ref 65–99)
POTASSIUM: 4.7 mmol/L (ref 3.5–5.2)
Sodium: 141 mmol/L (ref 134–144)
Total Bilirubin: 0.6 mg/dL (ref 0.0–1.2)
Total Protein: 6.7 g/dL (ref 6.0–8.5)

## 2014-02-15 ENCOUNTER — Other Ambulatory Visit: Payer: Self-pay | Admitting: Family Medicine

## 2014-02-15 DIAGNOSIS — R1310 Dysphagia, unspecified: Secondary | ICD-10-CM

## 2014-02-19 DIAGNOSIS — I2510 Atherosclerotic heart disease of native coronary artery without angina pectoris: Secondary | ICD-10-CM | POA: Diagnosis not present

## 2014-02-19 DIAGNOSIS — J449 Chronic obstructive pulmonary disease, unspecified: Secondary | ICD-10-CM | POA: Diagnosis not present

## 2014-02-19 DIAGNOSIS — F0390 Unspecified dementia without behavioral disturbance: Secondary | ICD-10-CM | POA: Diagnosis not present

## 2014-02-19 DIAGNOSIS — R1310 Dysphagia, unspecified: Secondary | ICD-10-CM | POA: Diagnosis not present

## 2014-02-19 DIAGNOSIS — E119 Type 2 diabetes mellitus without complications: Secondary | ICD-10-CM | POA: Diagnosis not present

## 2014-02-19 DIAGNOSIS — I10 Essential (primary) hypertension: Secondary | ICD-10-CM | POA: Diagnosis not present

## 2014-02-20 DIAGNOSIS — R1310 Dysphagia, unspecified: Secondary | ICD-10-CM | POA: Diagnosis not present

## 2014-02-20 DIAGNOSIS — I10 Essential (primary) hypertension: Secondary | ICD-10-CM | POA: Diagnosis not present

## 2014-02-20 DIAGNOSIS — I2510 Atherosclerotic heart disease of native coronary artery without angina pectoris: Secondary | ICD-10-CM | POA: Diagnosis not present

## 2014-02-20 DIAGNOSIS — F0390 Unspecified dementia without behavioral disturbance: Secondary | ICD-10-CM | POA: Diagnosis not present

## 2014-02-20 DIAGNOSIS — J449 Chronic obstructive pulmonary disease, unspecified: Secondary | ICD-10-CM | POA: Diagnosis not present

## 2014-02-20 DIAGNOSIS — E119 Type 2 diabetes mellitus without complications: Secondary | ICD-10-CM | POA: Diagnosis not present

## 2014-02-21 DIAGNOSIS — F0390 Unspecified dementia without behavioral disturbance: Secondary | ICD-10-CM | POA: Diagnosis not present

## 2014-02-21 DIAGNOSIS — E119 Type 2 diabetes mellitus without complications: Secondary | ICD-10-CM | POA: Diagnosis not present

## 2014-02-21 DIAGNOSIS — J449 Chronic obstructive pulmonary disease, unspecified: Secondary | ICD-10-CM | POA: Diagnosis not present

## 2014-02-21 DIAGNOSIS — I10 Essential (primary) hypertension: Secondary | ICD-10-CM | POA: Diagnosis not present

## 2014-02-21 DIAGNOSIS — R1310 Dysphagia, unspecified: Secondary | ICD-10-CM | POA: Diagnosis not present

## 2014-02-21 DIAGNOSIS — I2510 Atherosclerotic heart disease of native coronary artery without angina pectoris: Secondary | ICD-10-CM | POA: Diagnosis not present

## 2014-02-25 ENCOUNTER — Ambulatory Visit (HOSPITAL_COMMUNITY)
Admission: RE | Admit: 2014-02-25 | Discharge: 2014-02-25 | Disposition: A | Discharge status: Home / Self Care | Payer: Medicare Other | Source: Ambulatory Visit | Attending: Family Medicine | Admitting: Family Medicine

## 2014-02-25 DIAGNOSIS — E119 Type 2 diabetes mellitus without complications: Secondary | ICD-10-CM | POA: Diagnosis not present

## 2014-02-25 DIAGNOSIS — I2510 Atherosclerotic heart disease of native coronary artery without angina pectoris: Secondary | ICD-10-CM | POA: Diagnosis not present

## 2014-02-25 DIAGNOSIS — IMO0001 Reserved for inherently not codable concepts without codable children: Secondary | ICD-10-CM | POA: Insufficient documentation

## 2014-02-25 DIAGNOSIS — I10 Essential (primary) hypertension: Secondary | ICD-10-CM | POA: Diagnosis not present

## 2014-02-25 DIAGNOSIS — R1310 Dysphagia, unspecified: Secondary | ICD-10-CM | POA: Diagnosis not present

## 2014-02-25 DIAGNOSIS — F0390 Unspecified dementia without behavioral disturbance: Secondary | ICD-10-CM | POA: Insufficient documentation

## 2014-02-25 DIAGNOSIS — J449 Chronic obstructive pulmonary disease, unspecified: Secondary | ICD-10-CM | POA: Diagnosis not present

## 2014-02-25 NOTE — Procedures (Signed)
Objective Swallowing Evaluation: Modified Barium Swallowing Study   Patient Details  Name: Ryan [name] MRN: [id number] Date of Birth: 1934/11/03  Today's Date: 02/25/2014 Time: 9:30 AM  - 10:08 AM    Past Medical History:  Past Medical History  Diagnosis Date  . Essential hypertension, benign      Hospital admission 10/13  . Abnormal ECG   . Type 2 diabetes mellitus   . Coronary atherosclerosis of native coronary artery     Based on Myoview - managing medically  . Dementia     Possible   Past Surgical History:  Past Surgical History  Procedure Laterality Date  . Hernia repair    . Prostate surgery    . Knee surgery    . Hip surgery     HPI:   [name] was referred for MBSS by Dr. Rudi [name] after recommendation from his treating home health SLP, Ryan [name]. Pt had MBSS on 01/31/2014 while an inpatient at [facility] with recommendation for D3/NTL.    Recommendation/Prognosis  Clinical Impression:   Dysphagia Diagnosis: Mild pharyngeal phase dysphagia Clinical impression: [name] was seen for repeat MBSS following previous study on 01/31/2014 with recommendation for D3/NTL. Pt demonstrates improved swallow function however mild oropharyngeal dysphagia persists characterized by slow oral prep (pt with dentures) with solids, premature spillage of liquids with mild delay in swallow initiation, decreased epiglottic deflection and tongue base retraction, and decreased vocal fold closure resulting in penetration and aspiration of thins during the swallow. Aspirate was removed spontaneously (without cough) during the swallow. Penetration and aspiration of thins increased when pt taking multiple/sequential swallows. Mild/mod vallecular residue noted with puree bolus, however when pt cued to implement effortful swallow, residue was minimized. Pt aspirated thins when taking barium tablet (before the swallow) and barium tablet dropped into laryngeal  vestibule. Spontaneous cough expelled pill and was then swallowed. Recommend self regulated regular textures with thin liquids, single sips only, no straws, effortful swallow with all all solids. Take pills whole in puree and follow with liquid wash. Standard aspiration and reflux precautions. Results discussed with pt and treating SLP.    Swallow Evaluation Recommendations:  Diet Recommendations: Thin liquid;Regular Liquid Administration via: Cup Medication Administration: Whole meds with puree Supervision: Patient able to self feed Compensations: Small sips/bites;Clear throat intermittently;Effortful swallow Postural Changes and/or Swallow Maneuvers: Out of bed for meals;Seated upright 90 degrees;Upright 30-60 min after meal Oral Care Recommendations: Oral care BID Other Recommendations: Clarify dietary restrictions Follow up Recommendations: Home health SLP    Prognosis:  Prognosis for Safe Diet Advancement: Guarded   Individuals Consulted: Consulted and Agree with Results and Recommendations: Patient;Other (Comment) Report Sent to : Primary SLP     General: Date of Onset: 01/31/14 Type of Study: Modified Barium Swallowing Study Reason for Referral: Objectively evaluate swallowing function Diet Prior to this Study: Dysphagia 3 (soft);Nectar-thick liquids Temperature Spikes Noted: No Respiratory Status: Room air History of Recent Intubation: No Behavior/Cognition: Alert;Cooperative;Pleasant mood Oral Cavity - Dentition: Dentures, top;Dentures, bottom Oral Motor / Sensory Function: Within functional limits Self-Feeding Abilities: Able to feed self Patient Positioning: Upright in chair Baseline Vocal Quality: Clear Volitional Cough: Strong Volitional Swallow: Able to elicit Anatomy: Within functional limits Pharyngeal Secretions: Not observed secondary MBS   Reason for Referral:   Objectively evaluate swallowing function    Oral Phase: Oral Preparation/Oral Phase Oral  Phase: WFL   Pharyngeal Phase:  Pharyngeal Phase Pharyngeal Phase: Impaired Pharyngeal - Nectar Pharyngeal - Nectar Cup:  Delayed swallow initiation;Premature spillage to valleculae;Reduced epiglottic inversion;Lateral channel residue;Pharyngeal residue - valleculae Pharyngeal - Thin Pharyngeal - Thin Teaspoon: Premature spillage to valleculae Pharyngeal - Thin Cup: Premature spillage to valleculae;Premature spillage to pyriform sinuses;Reduced pharyngeal peristalsis;Reduced epiglottic inversion;Reduced airway/laryngeal closure;Reduced tongue base retraction;Penetration/Aspiration during swallow;Penetration/Aspiration after swallow;Trace aspiration;Pharyngeal residue - valleculae;Lateral channel residue;Pharyngeal residue - cp segment Penetration/Aspiration details (thin cup): Material enters airway, passes BELOW cords then ejected out;Material enters airway, remains ABOVE vocal cords and not ejected out;Material does not enter airway;Material enters airway, remains ABOVE vocal cords then ejected out Pharyngeal - Solids Pharyngeal - Puree: Premature spillage to valleculae;Reduced epiglottic inversion;Reduced tongue base retraction;Pharyngeal residue - valleculae;Pharyngeal residue - posterior pharnyx Pharyngeal - Regular: Pharyngeal residue - valleculae;Pharyngeal residue - posterior pharnyx Pharyngeal - Pill: Premature spillage to pyriform sinuses;Penetration/Aspiration before swallow Penetration/Aspiration details (pill): Material enters airway, remains ABOVE vocal cords then ejected out   Cervical Esophageal Phase  Cervical Esophageal Phase Cervical Esophageal Phase: [facility]   GN  Functional Assessment Tool Used: MBSS Functional Limitations: Swallowing Swallow Current Status ([id number]): At least 20 percent but less than 40 percent impaired, limited or restricted Swallow Goal Status [phone]): At least 20 percent but less than 40 percent impaired, limited or restricted Swallow Discharge Status  [phone]): At least 20 percent but less than 40 percent impaired, limited or restricted      Thank you,  Havery MorosDabney Sharni Negron, CCC-SLP [phone]  Quanell Loughney 02/25/2014, 11:29 AM

## 2014-04-23 DIAGNOSIS — M2020 Hallux rigidus, unspecified foot: Secondary | ICD-10-CM | POA: Diagnosis not present

## 2014-04-23 DIAGNOSIS — M779 Enthesopathy, unspecified: Secondary | ICD-10-CM | POA: Diagnosis not present

## 2014-04-23 DIAGNOSIS — R609 Edema, unspecified: Secondary | ICD-10-CM | POA: Diagnosis not present

## 2014-05-22 ENCOUNTER — Ambulatory Visit: Payer: Medicare Other | Admitting: Nurse Practitioner

## 2014-05-23 ENCOUNTER — Ambulatory Visit (INDEPENDENT_AMBULATORY_CARE_PROVIDER_SITE_OTHER): Payer: Medicare Other | Admitting: Adult Health

## 2014-05-23 ENCOUNTER — Encounter: Payer: Self-pay | Admitting: Adult Health

## 2014-05-23 VITALS — BP 178/82 | HR 70 | Ht 75.0 in | Wt 175.0 lb

## 2014-05-23 DIAGNOSIS — I10 Essential (primary) hypertension: Secondary | ICD-10-CM | POA: Diagnosis not present

## 2014-05-23 DIAGNOSIS — I2510 Atherosclerotic heart disease of native coronary artery without angina pectoris: Secondary | ICD-10-CM | POA: Diagnosis not present

## 2014-05-23 NOTE — Assessment & Plan Note (Signed)
He denies any complaints of chest pain, DOE, or fatigue. He is concerning as he has significant issues with memory and psychiatric issues. I have asked him to follow up with his PCP soon to discuss home situation and need for assitance there. He had HHN nurses soon after recent discharge from [facility] in May but they no longer come. He states that his ex-wife checks on him every few days.

## 2014-05-23 NOTE — Progress Notes (Deleted)
Name: Ryan [name]    DOB: 02-16-1935  Age: 79 y.o.  MR#: [id number]       PCP:  Rudi Heap, MD      Insurance: Payor: MEDICARE / Plan: MEDICARE PART A AND B / Product Type: *No Product type* /   CC:    Chief Complaint  Patient presents with  . Hypertension    VS Filed Vitals:   05/23/14 1426  BP: 178/82  Pulse: 70  Height:  (1.905 m)  Weight: 175 lb (79.379 kg)  SpO2: 98%    Weights Current Weight  05/23/14 175 lb (79.379 kg)  02/13/14 172 lb (78.019 kg)  01/31/14 176 lb 5.9 oz (80 kg)    Blood Pressure  BP Readings from Last 3 Encounters:  05/23/14 178/82  02/13/14 166/78  02/01/14 168/78     Admit date:  (Not on file) Last encounter with RMR:  Visit date not found   Allergy Lisinopril  Current Outpatient Prescriptions  Medication Sig Dispense Refill  . Ascorbic Acid (VITAMIN C) 1000 MG tablet Take 1,000 mg by mouth daily.      Marland Kitchen aspirin EC 81 MG tablet Take 81 mg by mouth at bedtime.       . cyanocobalamin 500 MCG tablet Take 500 mcg by mouth daily.      . Garlic 1000 MG CAPS Take 3 capsules by mouth every morning.       . Red Yeast Rice Extract (RED YEAST RICE PO) Take 1 capsule by mouth 2 (two) times daily.       No current facility-administered medications for this visit.    Discontinued Meds:    Medications Discontinued During This Encounter  Medication Reason  . amLODipine (NORVASC) 5 MG tablet Error    Patient Active Problem List   Diagnosis Date Noted  . Dysphagia, unspecified(787.20) 01/31/2014  . HTN (hypertension) 01/29/2014  . Diarrhea 01/29/2014  . AKI (acute kidney injury) 01/29/2014  . Acute kidney injury 01/29/2014  . Coronary atherosclerosis of native coronary artery 10/16/2012  . Essential hypertension, benign 10/16/2012  . Dementia 10/16/2012  . Type 2 diabetes mellitus 08/08/2012  . EKG, abnormal 07/07/2012    LABS    Component Value Date/Time   NA 141 02/13/2014 0838   NA 141 02/01/2014 0635   NA 143 01/31/2014  0548   NA 143 01/30/2014 0609   K 4.7 02/13/2014 0838   K 3.6* 02/01/2014 0635   K 3.8 01/31/2014 0548   CL 103 02/13/2014 0838   CL 107 02/01/2014 0635   CL 108 01/31/2014 0548   CO2 23 02/13/2014 0838   CO2 25 02/01/2014 0635   CO2 24 01/31/2014 0548   GLUCOSE 146* 02/13/2014 0838   GLUCOSE 116* 02/01/2014 0635   GLUCOSE 110* 01/31/2014 0548   GLUCOSE 122* 01/30/2014 0609   BUN 20 02/13/2014 0838   BUN 10 02/01/2014 0635   BUN 17 01/31/2014 0548   BUN 32* 01/30/2014 0609   CREATININE 1.01 02/13/2014 0838   CREATININE 0.82 02/01/2014 0635   CREATININE 0.85 01/31/2014 0548   CALCIUM 9.3 02/13/2014 0838   CALCIUM 8.3* 02/01/2014 0635   CALCIUM 8.5 01/31/2014 0548   GFRNONAA 72 02/13/2014 0838   GFRNONAA 84* 02/01/2014 0635   GFRNONAA 83* 01/31/2014 0548   GFRAA 83 02/13/2014 0838   GFRAA >90 02/01/2014 0635   GFRAA >90 01/31/2014 0548   CMP     Component Value Date/Time   NA 141 02/13/2014 0838   NA  141 02/01/2014 0635   K 4.7 02/13/2014 0838   CL 103 02/13/2014 0838   CO2 23 02/13/2014 0838   GLUCOSE 146* 02/13/2014 0838   GLUCOSE 116* 02/01/2014 0635   BUN 20 02/13/2014 0838   BUN 10 02/01/2014 0635   CREATININE 1.01 02/13/2014 0838   CALCIUM 9.3 02/13/2014 0838   PROT 6.7 02/13/2014 0838   PROT 7.2 01/29/2014 1652   ALBUMIN 3.1* 01/29/2014 1652   AST 18 02/13/2014 0838   ALT 15 02/13/2014 0838   ALKPHOS 64 02/13/2014 0838   BILITOT 0.6 02/13/2014 0838   GFRNONAA 72 02/13/2014 0838   GFRAA 83 02/13/2014 0838       Component Value Date/Time   WBC 7.6 02/01/2014 0635   WBC 5.5 01/31/2014 0548   WBC 9.6 01/29/2014 1652   HGB 10.7* 02/01/2014 0635   HGB 11.5* 01/31/2014 0548   HGB 14.0 01/29/2014 1652   HCT 31.6* 02/01/2014 0635   HCT 34.4* 01/31/2014 0548   HCT 40.6 01/29/2014 1652   MCV 89.3 02/01/2014 0635   MCV 91.2 01/31/2014 0548   MCV 89.0 01/29/2014 1652    Lipid Panel     Component Value Date/Time   CHOL 150 07/08/2012 0551   TRIG 111 07/08/2012 0551   HDL 50 07/08/2012 0551   CHOLHDL 3.0 07/08/2012 0551   VLDL 22  07/08/2012 0551   LDLCALC 78 07/08/2012 0551    ABG No results found for this basename: phart, pco2, pco2art, po2, po2art, hco3, tco2, acidbasedef, o2sat     No results found for this basename: TSH   BNP (last 3 results) No results found for this basename: PROBNP,  in the last 8760 hours Cardiac Panel (last 3 results) No results found for this basename: CKTOTAL, CKMB, TROPONINI, RELINDX,  in the last 72 hours  Iron/TIBC/Ferritin/ %Sat No results found for this basename: iron, tibc, ferritin, ironpctsat     EKG Orders placed during the hospital encounter of 01/29/14  . ED EKG  . ED EKG  . EKG 12-LEAD  . EKG 12-LEAD     Prior Assessment and Plan Problem List as of 05/23/2014     Cardiovascular and Mediastinum   Coronary atherosclerosis of native coronary artery   Last Assessment & Plan   10/16/2012 Office Visit Written 10/16/2012  9:28 AM by Jonelle Sidle, MD     Based on abnormal Myoview, overall low risk with area of inferior apical ischemia. I discussed this with him again today and showed him the images. Plan to continue medical therapy at this point.    Essential hypertension, benign   Last Assessment & Plan   10/16/2012 Office Visit Written 10/16/2012  9:28 AM by Jonelle Sidle, MD     Blood pressure is much better controlled overall. Continue current regimen.    HTN (hypertension)     Digestive   Dysphagia, unspecified(787.20)     Endocrine   Type 2 diabetes mellitus   Last Assessment & Plan   10/16/2012 Office Visit Written 10/16/2012  9:29 AM by Jonelle Sidle, MD     Followed by Dr. Sherril Croon.      Nervous and Auditory   Dementia   Last Assessment & Plan   10/16/2012 Office Visit Written 10/16/2012  9:29 AM by Jonelle Sidle, MD     Possible diagnosis, at least significant problems with memory seems to be the case. He is followed by Dr. Sherril Croon.      Genitourinary   AKI (acute kidney  injury)   Acute kidney injury     Other   EKG, abnormal   Diarrhea        Imaging: No results found.

## 2014-05-23 NOTE — Patient Instructions (Signed)
Your physician wants you to follow-up in: 6 months You will receive a reminder letter in the mail two months in advance. If you don't receive a letter, please call our office to schedule the follow-up appointment.     Your physician recommends that you continue on your current medications as directed. Please refer to the Current Medication list given to you today.      Thank you for choosing [facility] !        

## 2014-05-23 NOTE — Assessment & Plan Note (Signed)
He adamantly refuses to take any other antihypertensive medications. He was sent home on amlodipine 5 mg  daily after recent hospitalization in May of 2015. He is afraid that it will "kill me" and states that his blood pressures usually lower at home. I have advised him to followup with his primary care physician. She is in [place]. She may be able to have assistance concerning blood pressure checks, and adherence to medications. Blood pressure in the office today is 178/82. I am concerned about his memory, inability to live alone. He refuses any assistance here in the office to evaluate his needs and consider assisted living facility.

## 2014-05-23 NOTE — Progress Notes (Signed)
    HPI: [name] is a 79 year old patient of Dr. Diona Browner we follow for ongoing assessment and management of CAD, hypertension, with history of diabetes, and dementia. He was recently admitted to the hospital after admission for acute kidney injury and severe diarrhea related to gastroenteritis.. Other history includes COPD and ongoing tobacco abuse.  The patient had noncardiac issues during hospitalization. He was seen last by Dr. Diona Browner in February 2014. He had a Myoview in December 2013 abnormal below wrist showing nondiagnostic ST segment changes. Inferior apical ischemia. LVEF is 65%. He was treated medically. He said continuing deterioration of memory related to worsening dementia.  The patient is difficult to evaluate today. He has poor memory, and often begins to cry and suddenly began laughing. He has a primary care physician in [place], but does not remember her name. He comes today. Hypertensive. He refuses to take amlodipine, "I am afraid it will kill me" and therefore is not taking any antihypertensive meds.     Allergies  Allergen Reactions  . Lisinopril Cough    Current Outpatient Prescriptions  Medication Sig Dispense Refill  . Ascorbic Acid (VITAMIN C) 1000 MG tablet Take 1,000 mg by mouth daily.      Marland Kitchen aspirin EC 81 MG tablet Take 81 mg by mouth at bedtime.       . cyanocobalamin 500 MCG tablet Take 500 mcg by mouth daily.      . Garlic 1000 MG CAPS Take 3 capsules by mouth every morning.       . Red Yeast Rice Extract (RED YEAST RICE PO) Take 1 capsule by mouth 2 (two) times daily.       No current facility-administered medications for this visit.    Past Medical History  Diagnosis Date  . Essential hypertension, benign      Hospital admission 10/13  . Abnormal ECG   . Type 2 diabetes mellitus   . Coronary atherosclerosis of native coronary artery     Based on Myoview - managing medically  . Dementia     Possible    Past Surgical History  Procedure  Laterality Date  . Hernia repair    . Prostate surgery    . Knee surgery    . Hip surgery      ROS: Review of systems complete and found to be negative unless listed above  PHYSICAL EXAM BP 178/82  Pulse 70  Ht 6\' 3"  (1.905 m)  Wt 175 lb (79.379 kg)  BMI 21.87 kg/m2  SpO2 98%   General: Well developed, well nourished, in no acute distress Head: Eyes PERRLA, No xanthomas.   Normal cephalic and atramatic  Lungs: Clear bilaterally to auscultation and percussion. Heart: HRRR S1 S2, without MRG.  Pulses are 2+ & equal.            No carotid bruit. No JVD.  No abdominal bruits. No femoral bruits. Abdomen: Bowel sounds are positive, abdomen soft and non-tender without masses or                  Hernia's noted. Msk:  Back normal, normal gait. Normal strength and tone for age. Extremities: No clubbing, cyanosis or edema.  DP +1 Neuro: Alert and oriented X 3. Psych: Changing affect. Poor memory.   ASSESSMENT AND PLAN

## 2014-06-24 ENCOUNTER — Ambulatory Visit (INDEPENDENT_AMBULATORY_CARE_PROVIDER_SITE_OTHER): Payer: Medicare Other | Admitting: Nurse Practitioner

## 2014-06-24 ENCOUNTER — Encounter: Payer: Self-pay | Admitting: Nurse Practitioner

## 2014-06-24 VITALS — BP 163/79 | Temp 97.0°F | Ht 75.0 in | Wt 183.0 lb

## 2014-06-24 DIAGNOSIS — I2510 Atherosclerotic heart disease of native coronary artery without angina pectoris: Secondary | ICD-10-CM

## 2014-06-24 DIAGNOSIS — N179 Acute kidney failure, unspecified: Secondary | ICD-10-CM | POA: Diagnosis not present

## 2014-06-24 DIAGNOSIS — B372 Candidiasis of skin and nail: Secondary | ICD-10-CM

## 2014-06-24 DIAGNOSIS — I10 Essential (primary) hypertension: Secondary | ICD-10-CM | POA: Diagnosis not present

## 2014-06-24 DIAGNOSIS — F0390 Unspecified dementia without behavioral disturbance: Secondary | ICD-10-CM

## 2014-06-24 DIAGNOSIS — E119 Type 2 diabetes mellitus without complications: Secondary | ICD-10-CM | POA: Diagnosis not present

## 2014-06-24 LAB — POCT GLYCOSYLATED HEMOGLOBIN (HGB A1C): Hemoglobin A1C: 6.5

## 2014-06-24 MED ORDER — NYSTATIN 100000 UNIT/GM EX CREA: 1.0000 "application " | TOPICAL_CREAM | Freq: Two times a day (BID) | CUTANEOUS | Status: DC

## 2014-06-24 NOTE — Patient Instructions (Signed)
Cutaneous Candidiasis Cutaneous candidiasis is a condition in which there is an overgrowth of yeast (candida) on the skin. Yeast normally live on the skin, but in small enough numbers not to cause any symptoms. In certain cases, increased growth of the yeast may cause an actual yeast infection. This kind of infection usually occurs in areas of the skin that are constantly warm and moist, such as the armpits or the groin. Yeast is the most common cause of diaper rash in babies and in people who cannot control their bowel movements (incontinence). CAUSES  The fungus that most often causes cutaneous candidiasis is Candida albicans. Conditions that can increase the risk of getting a yeast infection of the skin include:  Obesity.  Pregnancy.  Diabetes.  Taking antibiotic medicine.  Taking birth control pills.  Taking steroid medicines.  Thyroid disease.  An iron or zinc deficiency.  Problems with the immune system. SYMPTOMS   Red, swollen area of the skin.  Bumps on the skin.  Itchiness. DIAGNOSIS  The diagnosis of cutaneous candidiasis is usually based on its appearance. Light scrapings of the skin may also be taken and viewed under a microscope to identify the presence of yeast. TREATMENT  Antifungal creams may be applied to the infected skin. In severe cases, oral medicines may be needed.  HOME CARE INSTRUCTIONS   Keep your skin clean and dry.  Maintain a healthy weight.  If you have diabetes, keep your blood sugar under control. SEEK IMMEDIATE MEDICAL CARE IF:  Your rash continues to spread despite treatment.  You have a fever, chills, or abdominal pain. Document Released: 05/18/2011 Document Revised: 11/22/2011 Document Reviewed: 05/18/2011 Exit[employer] Patient Information 2015 [employer]. This information is not intended to replace advice given to you by your health care provider. Make sure you discuss any questions you have with your health care provider.  

## 2014-06-24 NOTE — Progress Notes (Signed)
Subjective:    Patient ID: Ryan [name], male    DOB: 1935-07-24, 79 y.o.   MRN: [id number]  Patient here today for follow up of chronic medical problems. Patient has a lesion on back that he wants looked at. Has a rash in right axillia that is very itchy.   Hypertension This is a chronic problem. The current episode started more than 1 year ago. The problem is unchanged. The problem is uncontrolled. Pertinent negatives include no blurred vision, headaches, neck pain, palpitations, peripheral edema or shortness of breath. There are no associated agents to hypertension. Risk factors for coronary artery disease include diabetes mellitus, dyslipidemia, family history and male gender. Past treatments include nothing. There are no compliance problems.   Hyperlipidemia This is a chronic problem. The current episode started more than 1 year ago. The problem is uncontrolled. Recent lipid tests were reviewed and are high. Exacerbating diseases include diabetes. He has no history of hypothyroidism or obesity. Pertinent negatives include no shortness of breath. He is currently on no antihyperlipidemic treatment. The current treatment provides no improvement of lipids. Compliance problems include adherence to diet and adherence to exercise.  Risk factors for coronary artery disease include dyslipidemia, diabetes mellitus, hypertension and male sex.  Diabetes He presents for his follow-up diabetic visit. He has type 2 diabetes mellitus. There are no hypoglycemic associated symptoms. Pertinent negatives for hypoglycemia include no headaches. Pertinent negatives for diabetes include no blurred vision. There are no hypoglycemic complications. There are no diabetic complications. Risk factors for coronary artery disease include diabetes mellitus, dyslipidemia, hypertension and male sex. When asked about current treatments, none (patient not on any meds- has dementia and not sure he knows he is suppose to be on  anything.) were reported. He has not had a previous visit with a dietician. He rarely participates in exercise. (Does not check at home ) An ACE inhibitor/angiotensin II receptor blocker is not being taken. He does not see a podiatrist.Eye exam is not current.  Dementia Not taking anything- Not sure he knows he is suppsoe to be on any meds.  Review of Systems  Eyes: Negative for blurred vision.  Respiratory: Negative for shortness of breath.   Cardiovascular: Negative for palpitations.  Musculoskeletal: Negative for neck pain.  Neurological: Negative for headaches.       Objective:   Physical Exam  Constitutional: He is oriented to person, place, and time. He appears well-developed and well-nourished.  HENT:  Head: Normocephalic.  Right Ear: External ear normal.  Left Ear: External ear normal.  Nose: Nose normal.  Mouth/Throat: Oropharynx is clear and moist.  Eyes: EOM are normal. Pupils are equal, round, and reactive to light.  Neck: Normal range of motion. Neck supple. No JVD present. No thyromegaly present.  Cardiovascular: Normal rate, regular rhythm, normal heart sounds and intact distal pulses.  Exam reveals no gallop and no friction rub.   No murmur heard. Pulmonary/Chest: Effort normal and breath sounds normal. No respiratory distress. He has no wheezes. He has no rales. He exhibits no tenderness.  Abdominal: Soft. Bowel sounds are normal. He exhibits no mass. There is no tenderness.  Genitourinary: Prostate normal and penis normal.  Musculoskeletal: Normal range of motion. He exhibits no edema.  Lymphadenopathy:    He has no cervical adenopathy.  Neurological: He is alert and oriented to person, place, and time. No cranial nerve deficit.  Skin: Skin is warm and dry.  Erythematous rash bil axillary areas  Psychiatric: He has a  normal mood and affect. His behavior is normal. Judgment and thought content normal.   BP 163/79  Temp(Src) 97 F (36.1 C) (Oral)  Ht 6\' 3"   (1.905 m)  Wt 183 lb (83.008 kg)  BMI 22.87 kg/m2  Results for orders placed in visit on 06/24/14  POCT GLYCOSYLATED HEMOGLOBIN (HGB A1C)      Result Value Ref Range   Hemoglobin A1C 6.5           Assessment & Plan:   1. Atherosclerosis of native coronary artery of native heart without angina pectoris   2. Acute kidney injury   3. Type 2 diabetes mellitus without complication   4. Essential hypertension   5. Essential hypertension, benign   6. Dementia, without behavioral disturbance   7. Cutaneous candidiasis   Diet control diabetes Patient needs blood pressure meds but refuses to take Health maintenance reviewed Det and exercise encouraged Follow up in 3 months  Mary-Margaret Daphine DeutscherMartin, FNP

## 2014-06-25 DIAGNOSIS — H2513 Age-related nuclear cataract, bilateral: Secondary | ICD-10-CM | POA: Diagnosis not present

## 2014-06-25 LAB — CMP14+EGFR
ALBUMIN: 3.7 g/dL (ref 3.5–4.8)
ALK PHOS: 71 IU/L (ref 39–117)
ALT: 8 IU/L (ref 0–44)
AST: 14 IU/L (ref 0–40)
Albumin/Globulin Ratio: 1.3 (ref 1.1–2.5)
BUN/Creatinine Ratio: 20 (ref 10–22)
BUN: 18 mg/dL (ref 8–27)
CHLORIDE: 102 mmol/L (ref 97–108)
CO2: 23 mmol/L (ref 18–29)
Calcium: 9.5 mg/dL (ref 8.6–10.2)
Creatinine, Ser: 0.92 mg/dL (ref 0.76–1.27)
GFR calc Af Amer: 92 mL/min/{1.73_m2} (ref >59)
GFR calc non Af Amer: 80 mL/min/{1.73_m2} (ref >59)
Globulin, Total: 2.9 g/dL (ref 1.5–4.5)
Glucose: 134 mg/dL — ABNORMAL HIGH (ref 65–99)
Potassium: 4.5 mmol/L (ref 3.5–5.2)
SODIUM: 141 mmol/L (ref 134–144)
Total Bilirubin: 0.4 mg/dL (ref 0.0–1.2)
Total Protein: 6.6 g/dL (ref 6.0–8.5)

## 2014-06-25 LAB — NMR, LIPOPROFILE
Cholesterol: 159 mg/dL (ref 100–199)
HDL Cholesterol by NMR: 45 mg/dL (ref >39)
HDL Particle Number: 29 umol/L — ABNORMAL LOW (ref >=30.5)
LDL PARTICLE NUMBER: 1041 nmol/L — AB (ref <1000)
LDL Size: 20.7 nm (ref >20.5)
LDLC SERPL CALC-MCNC: 72 mg/dL (ref 0–99)
LP-IR Score: 60 — ABNORMAL HIGH (ref <=45)
Small LDL Particle Number: 302 nmol/L (ref <=527)
Triglycerides by NMR: 210 mg/dL — ABNORMAL HIGH (ref 0–149)

## 2014-08-21 ENCOUNTER — Encounter: Payer: Self-pay | Admitting: *Deleted

## 2014-09-25 ENCOUNTER — Ambulatory Visit: Payer: Medicare Other | Admitting: Nurse Practitioner

## 2014-09-26 ENCOUNTER — Ambulatory Visit: Payer: Medicare Other | Admitting: Nurse Practitioner

## 2015-03-10 ENCOUNTER — Other Ambulatory Visit: Payer: Self-pay

## 2015-05-01 ENCOUNTER — Other Ambulatory Visit: Payer: Self-pay

## 2015-05-01 ENCOUNTER — Encounter (HOSPITAL_COMMUNITY): Payer: Self-pay | Admitting: Emergency Medicine

## 2015-05-01 ENCOUNTER — Observation Stay (HOSPITAL_COMMUNITY)
Admission: EM | Admit: 2015-05-01 | Discharge: 2015-05-03 | Disposition: A | Discharge status: Home / Self Care | Payer: Medicare Other | Attending: Internal Medicine | Admitting: Internal Medicine

## 2015-05-01 ENCOUNTER — Emergency Department (HOSPITAL_COMMUNITY): Payer: Medicare Other

## 2015-05-01 ENCOUNTER — Other Ambulatory Visit: Payer: Medicare Other

## 2015-05-01 ENCOUNTER — Encounter: Payer: Self-pay | Admitting: Family Medicine

## 2015-05-01 ENCOUNTER — Ambulatory Visit (INDEPENDENT_AMBULATORY_CARE_PROVIDER_SITE_OTHER): Payer: Medicare Other | Admitting: Family Medicine

## 2015-05-01 VITALS — BP 164/76 | HR 60 | Temp 96.9°F | Ht 75.0 in | Wt 180.0 lb

## 2015-05-01 DIAGNOSIS — E875 Hyperkalemia: Secondary | ICD-10-CM | POA: Diagnosis not present

## 2015-05-01 DIAGNOSIS — Z87891 Personal history of nicotine dependence: Secondary | ICD-10-CM | POA: Insufficient documentation

## 2015-05-01 DIAGNOSIS — I509 Heart failure, unspecified: Secondary | ICD-10-CM | POA: Diagnosis not present

## 2015-05-01 DIAGNOSIS — I2510 Atherosclerotic heart disease of native coronary artery without angina pectoris: Secondary | ICD-10-CM | POA: Diagnosis present

## 2015-05-01 DIAGNOSIS — I10 Essential (primary) hypertension: Secondary | ICD-10-CM | POA: Diagnosis not present

## 2015-05-01 DIAGNOSIS — Z825 Family history of asthma and other chronic lower respiratory diseases: Secondary | ICD-10-CM | POA: Diagnosis not present

## 2015-05-01 DIAGNOSIS — R0609 Other forms of dyspnea: Secondary | ICD-10-CM

## 2015-05-01 DIAGNOSIS — R0989 Other specified symptoms and signs involving the circulatory and respiratory systems: Secondary | ICD-10-CM | POA: Diagnosis not present

## 2015-05-01 DIAGNOSIS — J811 Chronic pulmonary edema: Secondary | ICD-10-CM | POA: Diagnosis not present

## 2015-05-01 DIAGNOSIS — I5031 Acute diastolic (congestive) heart failure: Secondary | ICD-10-CM | POA: Diagnosis present

## 2015-05-01 DIAGNOSIS — I160 Hypertensive urgency: Secondary | ICD-10-CM | POA: Diagnosis present

## 2015-05-01 DIAGNOSIS — Z794 Long term (current) use of insulin: Secondary | ICD-10-CM | POA: Insufficient documentation

## 2015-05-01 DIAGNOSIS — Z8249 Family history of ischemic heart disease and other diseases of the circulatory system: Secondary | ICD-10-CM | POA: Insufficient documentation

## 2015-05-01 DIAGNOSIS — D696 Thrombocytopenia, unspecified: Secondary | ICD-10-CM | POA: Diagnosis present

## 2015-05-01 DIAGNOSIS — Z79899 Other long term (current) drug therapy: Secondary | ICD-10-CM | POA: Insufficient documentation

## 2015-05-01 DIAGNOSIS — G3184 Mild cognitive impairment, so stated: Secondary | ICD-10-CM | POA: Insufficient documentation

## 2015-05-01 DIAGNOSIS — R0682 Tachypnea, not elsewhere classified: Secondary | ICD-10-CM | POA: Diagnosis not present

## 2015-05-01 DIAGNOSIS — R05 Cough: Secondary | ICD-10-CM | POA: Insufficient documentation

## 2015-05-01 DIAGNOSIS — E1165 Type 2 diabetes mellitus with hyperglycemia: Secondary | ICD-10-CM

## 2015-05-01 DIAGNOSIS — I5043 Acute on chronic combined systolic (congestive) and diastolic (congestive) heart failure: Secondary | ICD-10-CM | POA: Diagnosis not present

## 2015-05-01 DIAGNOSIS — F0390 Unspecified dementia without behavioral disturbance: Secondary | ICD-10-CM | POA: Diagnosis not present

## 2015-05-01 DIAGNOSIS — R918 Other nonspecific abnormal finding of lung field: Secondary | ICD-10-CM | POA: Diagnosis not present

## 2015-05-01 DIAGNOSIS — IMO0002 Reserved for concepts with insufficient information to code with codable children: Secondary | ICD-10-CM

## 2015-05-01 DIAGNOSIS — Z7982 Long term (current) use of aspirin: Secondary | ICD-10-CM | POA: Diagnosis not present

## 2015-05-01 DIAGNOSIS — E119 Type 2 diabetes mellitus without complications: Secondary | ICD-10-CM

## 2015-05-01 DIAGNOSIS — I272 Other secondary pulmonary hypertension: Secondary | ICD-10-CM | POA: Diagnosis not present

## 2015-05-01 DIAGNOSIS — R0602 Shortness of breath: Secondary | ICD-10-CM | POA: Insufficient documentation

## 2015-05-01 DIAGNOSIS — I501 Left ventricular failure: Secondary | ICD-10-CM | POA: Diagnosis not present

## 2015-05-01 LAB — CBC WITH DIFFERENTIAL/PLATELET
BASOS PCT: 0 % (ref 0–1)
Basophils Absolute: 0 10*3/uL (ref 0.0–0.1)
Eosinophils Absolute: 0.2 10*3/uL (ref 0.0–0.7)
Eosinophils Relative: 4 % (ref 0–5)
HEMATOCRIT: 38.4 % — AB (ref 39.0–52.0)
Hemoglobin: 12.8 g/dL — ABNORMAL LOW (ref 13.0–17.0)
LYMPHS PCT: 30 % (ref 12–46)
Lymphs Abs: 1.8 10*3/uL (ref 0.7–4.0)
MCH: 30.1 pg (ref 26.0–34.0)
MCHC: 33.3 g/dL (ref 30.0–36.0)
MCV: 90.4 fL (ref 78.0–100.0)
MONO ABS: 0.4 10*3/uL (ref 0.1–1.0)
MONOS PCT: 6 % (ref 3–12)
Neutro Abs: 3.6 10*3/uL (ref 1.7–7.7)
Neutrophils Relative %: 60 % (ref 43–77)
Platelets: 122 10*3/uL — ABNORMAL LOW (ref 150–400)
RBC: 4.25 MIL/uL (ref 4.22–5.81)
RDW: 13.8 % (ref 11.5–15.5)
WBC: 5.9 10*3/uL (ref 4.0–10.5)

## 2015-05-01 LAB — BASIC METABOLIC PANEL
ANION GAP: 8 (ref 5–15)
BUN: 13 mg/dL (ref 6–20)
CALCIUM: 9.1 mg/dL (ref 8.9–10.3)
CO2: 24 mmol/L (ref 22–32)
Chloride: 107 mmol/L (ref 101–111)
Creatinine, Ser: 1 mg/dL (ref 0.61–1.24)
GFR calc Af Amer: >60 mL/min (ref >60)
GFR calc non Af Amer: >60 mL/min (ref >60)
Glucose, Bld: 118 mg/dL — ABNORMAL HIGH (ref 65–99)
Potassium: 4.3 mmol/L (ref 3.5–5.1)
Sodium: 139 mmol/L (ref 135–145)

## 2015-05-01 LAB — I-STAT TROPONIN, ED: Troponin i, poc: 0 ng/mL (ref 0.00–0.08)

## 2015-05-01 LAB — GLUCOSE, CAPILLARY: GLUCOSE-CAPILLARY: 121 mg/dL — AB (ref 65–99)

## 2015-05-01 LAB — POCT GLYCOSYLATED HEMOGLOBIN (HGB A1C): Hemoglobin A1C: 6.7

## 2015-05-01 LAB — BRAIN NATRIURETIC PEPTIDE: B Natriuretic Peptide: 1031 pg/mL — ABNORMAL HIGH (ref 0.0–100.0)

## 2015-05-01 LAB — POCT UA - MICROALBUMIN: MICROALBUMIN (UR) POC: 20 mg/L

## 2015-05-01 LAB — TROPONIN I: Troponin I: 0.03 ng/mL (ref <0.031)

## 2015-05-01 MED ORDER — SODIUM CHLORIDE 0.9 % IJ SOLN
3.0000 mL | Freq: Two times a day (BID) | INTRAMUSCULAR | Status: DC
  Administered 2015-05-01 – 2015-05-03 (×4): 3 mL via INTRAVENOUS

## 2015-05-01 MED ORDER — ACETAMINOPHEN 325 MG PO TABS: 650.0000 mg | ORAL_TABLET | ORAL | Status: DC | PRN

## 2015-05-01 MED ORDER — SODIUM CHLORIDE 0.9 % IV SOLN
250.0000 mL | INTRAVENOUS | Status: DC | PRN
Start: 2015-05-01 — End: 2015-05-03

## 2015-05-01 MED ORDER — INSULIN ASPART 100 UNIT/ML ~~LOC~~ SOLN
0.0000 [IU] | Freq: Every day | SUBCUTANEOUS | Status: DC
Start: 2015-05-01 — End: 2015-05-03

## 2015-05-01 MED ORDER — HYDRALAZINE HCL 20 MG/ML IJ SOLN: 5.0000 mg | Freq: Once | INTRAMUSCULAR | Status: DC

## 2015-05-01 MED ORDER — IRBESARTAN 75 MG PO TABS: 75.0000 mg | ORAL_TABLET | Freq: Every day | ORAL | Status: DC

## 2015-05-01 MED ORDER — INSULIN ASPART 100 UNIT/ML ~~LOC~~ SOLN
0.0000 [IU] | Freq: Three times a day (TID) | SUBCUTANEOUS | Status: DC
  Administered 2015-05-02 (×2): 1 [IU] via SUBCUTANEOUS

## 2015-05-01 MED ORDER — FUROSEMIDE 10 MG/ML IJ SOLN
40.0000 mg | Freq: Once | INTRAMUSCULAR | Status: AC
  Administered 2015-05-01: 40 mg via INTRAVENOUS
  Filled 2015-05-01: qty 4

## 2015-05-01 MED ORDER — LOSARTAN POTASSIUM 25 MG PO TABS: 25.0000 mg | ORAL_TABLET | Freq: Every day | ORAL | Status: DC

## 2015-05-01 MED ORDER — METFORMIN HCL 500 MG PO TABS: 500.0000 mg | ORAL_TABLET | Freq: Two times a day (BID) | ORAL | Status: DC

## 2015-05-01 MED ORDER — ENOXAPARIN SODIUM 40 MG/0.4ML ~~LOC~~ SOLN
40.0000 mg | SUBCUTANEOUS | Status: DC
  Administered 2015-05-01 – 2015-05-02 (×2): 40 mg via SUBCUTANEOUS
  Filled 2015-05-01 (×4): qty 0.4

## 2015-05-01 MED ORDER — ASPIRIN EC 81 MG PO TBEC
81.0000 mg | DELAYED_RELEASE_TABLET | Freq: Every day | ORAL | Status: DC
  Administered 2015-05-01 – 2015-05-02 (×2): 81 mg via ORAL
  Filled 2015-05-01 (×4): qty 1

## 2015-05-01 MED ORDER — ONDANSETRON HCL 4 MG/2ML IJ SOLN: 4.0000 mg | Freq: Four times a day (QID) | INTRAMUSCULAR | Status: DC | PRN

## 2015-05-01 MED ORDER — ISOSORBIDE MONONITRATE 15 MG HALF TABLET
15.0000 mg | ORAL_TABLET | Freq: Every day | ORAL | Status: DC
  Administered 2015-05-01: 15 mg via ORAL
  Filled 2015-05-01 (×2): qty 1

## 2015-05-01 MED ORDER — HYDRALAZINE HCL 25 MG PO TABS
25.0000 mg | ORAL_TABLET | Freq: Two times a day (BID) | ORAL | Status: DC
  Administered 2015-05-02 – 2015-05-03 (×3): 25 mg via ORAL
  Filled 2015-05-01 (×5): qty 1

## 2015-05-01 MED ORDER — SODIUM CHLORIDE 0.9 % IJ SOLN: 3.0000 mL | INTRAMUSCULAR | Status: DC | PRN

## 2015-05-01 MED ORDER — FUROSEMIDE 40 MG PO TABS
40.0000 mg | ORAL_TABLET | Freq: Every day | ORAL | Status: DC
  Filled 2015-05-01: qty 1

## 2015-05-01 NOTE — ED Notes (Signed)
EMS - Patient coming from [facility] with c/o of exertional SOB and ST depression.  History of valve issues last year, patient is unsure to the date.

## 2015-05-01 NOTE — ED Provider Notes (Signed)
CSN: [id number]     Arrival date & time 05/01/15  1247 History   First MD Initiated Contact with Patient 05/01/15 1253     Chief Complaint  Patient presents with  . Shortness of Breath     (Consider location/radiation/quality/duration/timing/severity/associated sxs/prior Treatment) HPI Comments: Ryan [name] is a 80 y.o. male with a PMHx of HTN (not on meds), DM2, CAD, and dementia, who presents to the ED via EMS from [facility] with complaints of gradual onset dyspnea on exertion. He reports that after walking up one flight of stairs he gets winded, which improves with rest. He also noticed mild bilateral lower extremity swelling over the last several months. Additionally reports an intermittent dry cough. His shortness breath is only with exertion, and improves with rest. He denies any fevers, chills, chest pain, shortness of breath at rest, hemoptysis, wheezing, recent travel/surgery/immobilization, diaphoresis, lightheadedness or dizziness, claudication, orthopnea, weight gain, abdominal pain, nausea, vomiting, diarrhea, constipation, dysuria, hematuria, numbness, tingling, weakness, headache, or vision changes. He is a former smoker. He states last year he had "valve issues" that he is not sure exactly what they were.  Patient is a 80 y.o. male presenting with shortness of breath. The history is provided by the patient. No language interpreter was used.  Shortness of Breath Severity:  Mild Onset quality:  Gradual Duration:  2 weeks (several months, worsening over 2 wks) Timing:  Intermittent (with exertion only) Progression:  Unchanged Chronicity:  New Context: activity   Relieved by:  Rest Worsened by:  Exertion Ineffective treatments:  None tried Associated symptoms: cough (dry intermittent)   Associated symptoms: no abdominal pain, no chest pain, no claudication, no diaphoresis, no fever, no headaches, no hemoptysis, no PND, no sputum production, no  vomiting and no wheezing   Risk factors: no hx of PE/DVT, no prolonged immobilization, no recent surgery and no tobacco use     Past Medical History  Diagnosis Date  . Essential hypertension, benign      Hospital admission 10/13  . Abnormal ECG   . Type 2 diabetes mellitus   . Coronary atherosclerosis of native coronary artery     Based on Myoview - managing medically  . Dementia     Possible   Past Surgical History  Procedure Laterality Date  . Hernia repair    . Prostate surgery    . Knee surgery    . Hip surgery     Family History  Problem Relation Age of Onset  . Heart attack Mother   . COPD Father    Social History  Substance Use Topics  . Smoking status: Former Smoker -- 1.00 packs/day for 15 years    Types: Cigarettes    Quit date: 01/12/1967  . Smokeless tobacco: Never Used  . Alcohol Use: No    Review of Systems  Constitutional: Negative for fever, chills, diaphoresis and unexpected weight change.  Eyes: Negative for visual disturbance.  Respiratory: Positive for cough (dry intermittent) and shortness of breath. Negative for hemoptysis, sputum production and wheezing.   Cardiovascular: Positive for leg swelling (gradual over several months). Negative for chest pain, claudication and PND.  Gastrointestinal: Negative for nausea, vomiting, abdominal pain, diarrhea and constipation.  Genitourinary: Negative for dysuria and hematuria.  Musculoskeletal: Negative for myalgias and arthralgias.  Skin: Negative for color change.  Allergic/Immunologic: Positive for immunocompromised state (diabetic).  Neurological: Negative for weakness, light-headedness, numbness and headaches.  Psychiatric/Behavioral: Negative for confusion.   10 Systems reviewed and are  negative for acute change except as noted in the HPI.    Allergies  Lisinopril  Home Medications   Prior to Admission medications   Medication Sig Start Date End Date Taking? Authorizing Provider  aspirin EC  81 MG tablet Take 81 mg by mouth at bedtime.  07/08/12   Osvaldo Shipper, MD  cyanocobalamin 500 MCG tablet Take 500 mcg by mouth daily.    Historical Provider, MD  Garlic 1000 MG CAPS Take 3 capsules by mouth every morning.     Historical Provider, MD  Red Yeast Rice Extract (RED YEAST RICE PO) Take 1 capsule by mouth 2 (two) times daily.    Historical Provider, MD   BP 195/104 mmHg  Pulse 74  Temp(Src) 97.7 F (36.5 C) (Oral)  Resp 18  Ht  (1.905 m)  Wt 180 lb (81.647 kg)  BMI 22.50 kg/m2  SpO2 98% Physical Exam  Constitutional: He is oriented to person, place, and time. He appears well-developed and well-nourished.  Non-toxic appearance. No distress.  Afebrile, nontoxic, NAD. BP 190s/100s consistent with prior ER visits.  HENT:  Head: Normocephalic and atraumatic.  Mouth/Throat: Oropharynx is clear and moist and mucous membranes are normal.  Eyes: Conjunctivae and EOM are normal. Right eye exhibits no discharge. Left eye exhibits no discharge.  Neck: Normal range of motion. Neck supple.  Cardiovascular: Normal rate, regular rhythm, normal heart sounds and intact distal pulses.  Exam reveals no gallop and no friction rub.   No murmur heard. RRR, nl s1/s2, no m/r/g, distal pulses equal and intact, 1+ b/l pedal edema   Pulmonary/Chest: Effort normal and breath sounds normal. No respiratory distress. He has no decreased breath sounds. He has no wheezes. He has no rhonchi. He has no rales.  CTAB in all lung fields, no w/r/r, no hypoxia or increased WOB, speaking in full sentences, SpO2 98% on RA   Abdominal: Soft. Normal appearance and bowel sounds are normal. He exhibits no distension. There is no tenderness. There is no rigidity, no rebound, no guarding, no CVA tenderness, no tenderness at McBurney's point and negative Murphy's sign.  Musculoskeletal: Normal range of motion.  MAE x4 Strength and sensation grossly intact Distal pulses intact 1+ b/l pedal edema, neg homan's  bilaterally   Neurological: He is alert and oriented to person, place, and time. He has normal strength. No sensory deficit.  Skin: Skin is warm, dry and intact. No rash noted.  Psychiatric: He has a normal mood and affect.  Nursing note and vitals reviewed.   ED Course  Procedures (including critical care time) Labs Review Labs Reviewed  BASIC METABOLIC PANEL - Abnormal; Notable for the following:    Glucose, Bld 118 (*)    All other components within normal limits  CBC WITH DIFFERENTIAL/PLATELET - Abnormal; Notable for the following:    Hemoglobin 12.8 (*)    HCT 38.4 (*)    Platelets 122 (*)    All other components within normal limits  BRAIN NATRIURETIC PEPTIDE  I-STAT TROPOININ, ED    Imaging Review Dg Chest 2 View  05/01/2015   CLINICAL DATA:  Short of breath with exertion.  EXAM: CHEST  2 VIEW  COMPARISON:  01/29/2014.  FINDINGS: Emphysema is present. Flattening of the hemidiaphragms. Cardiopericardial silhouette appears similar to prior exam, upper limits of normal for projection. Aortic arch atherosclerosis. There is pulmonary vascular congestion and interstitial pulmonary edema on today's exam. Small focus of opacity is present near the RIGHT costophrenic angle which may represent  a small focus of alveolar edema. Bronchopneumonia can also produce this appearance.  IMPRESSION: Mild CHF with interstitial pulmonary edema superimposed on chronic emphysema. Small area of airspace opacity near the RIGHT costophrenic angle could represent alveolar edema or bronchopneumonia.   Electronically Signed   By: Andreas Newport M.D.   On: 05/01/2015 14:08   ECHO 07/2012: mild MR and AI. Estimated mild pulmonary HTN. EF 55-60%.   I have personally reviewed and evaluated these images and lab results as part of my medical decision-making.   EKG Interpretation None     EKG at PCPs office: normal sinus rhythm, ST depression in V5 V6 is new, Q waves in 2, 3, aVF.  Pulmonary function testing  at PCPs office: Results from spirometry showed that patient has an FEV1 of less than 64% or 2.79 shows that this is significantly lower than the predicted that the patient likely has some component of COPD.   MDM   Final diagnoses:  Acute congestive heart failure, unspecified congestive heart failure type  Essential hypertension  DOE (dyspnea on exertion)    80 y.o. male here with increasing SOB on exertion for months, intermittent dry cough, and gradual onset BLE swelling for months. Sent here by PCP due to concerning EKG with ST depression in V4-5, sent for ACS rule out. BP 160s/70s at PCPs office, uncontrolled HTN with pt declining to be on meds in the past. Chart review reveals prior admission in 06/2012 for malignant HTN. Here BP 190s/100s. No CP or SOB at rest. No tachycardia or hypoxia. Trace BLE edema without discoloration. SpO2 98% on RA. Doubt PE/DVT or Dissection. PFTs at PCPs office showing FEV1<64% which is lower than predicted, PCP stating that likely some component of COPD. No wheezing, no increase sputum production, doubt COPD exacerbation. Could be CHF, will await kidney function studies before diuresing with lasix. Will get labs, EKG, CXR, and give hydralazine here to improve BP. Currently BP 195/104 with MAP 134, goal of 130 in first hour. Will reassess shortly.   2:59 PM Noted that hydralazine hadn't been given but BP down to 177/94 without any medications. Will hold on hydralazine. CXR showing mild CHF with interstitial pulm edema superimposed on chronic emphysema, small area of opacity which could be bronchopneumonia vs alveolar edema. Pt afebrile with nonproductive cough ongoing for months, doubt PNA. Still haven't gotten renal function but will give lasix 40 IV now. Trop neg. EKG here with ST depression in V4-5 which has been noted in prior EKGs. CBC w/diff showing baseline low plt count at 122. Still awaiting BNP and BMP. Will call lab to inquire regarding delays.  3:42  PM Labs still pending, after calling lab they stated they'd be up shortly. BMP WNL. BNP still pending. Will proceed with admission in order to delay care, admitting for CHF/COPD which are somewhat new diagnoses for him, and for HTN.   3:53 PM Ryan Silk PA-C for triad returning page, will admit. Please see her note for further documentation of care.   BP 177/94 mmHg  Pulse 71  Temp(Src) 97.7 F (36.5 C) (Oral)  Resp 18  Ht 6\' 3"  (1.905 m)  Wt 180 lb (81.647 kg)  BMI 22.50 kg/m2  SpO2 99%  Meds ordered this encounter  Medications  . DISCONTD: hydrALAZINE (APRESOLINE) injection 5 mg    Sig:   . furosemide (LASIX) injection 40 mg    Sig:      Ahleah Simko Camprubi-Soms, PA-C 05/01/15 1553  Raeford Razor, MD 05/02/15 0900

## 2015-05-01 NOTE — Progress Notes (Signed)
BP 164/76 mmHg  Pulse 60  Temp(Src) 96.9 F (36.1 C) (Oral)  Ht _0  (1.905 m)  Wt 180 lb (81.647 kg)  BMI 22.50 kg/m2   Subjective:    Patient ID: Ryan [name], male    DOB: 1935/06/08, 80 y.o.   MRN: [id number]  HPI: Ryan [name] is a 80 y.o. male presenting on 05/01/2015 for Follow up hypertension, diabetes, lipids and Shortness of Breath   HPI Exertional dyspnea Patient has had increased shortness of breath while ambulating even as short as going upstairs. This has been worsening over the past 2 weeks for him. He denies any chest pains, palpitations, wheezing, but does admit to having a recurrent productive cough. He does have a significant smoking history and also has a history of uncontrolled hypertension and prediabetes and hyperlipidemia. Also has swelling in bilateral lower extremities over the past couple months.  Hypertension Patient has hypertension and comes in for checkup today his blood pressure is elevated at 164/76. He is currently not taking any medications for his blood pressure. He does not like to take medications he likes to take natural supplements. He denies any chest pains but does admit to exertional dyspnea that has been worsening. Denies any lightheadedness, blurred vision, or dizziness.  Prediabetes type II Patient presents today for recheck on his prediabetes, his A1c today is 6.7 and his previous A1c was 6.5. He denies any numbness in his feet. He has not seen an ophthalmologist. He is currently not on any medications for his diabetes.  Relevant past medical, surgical, family and social history reviewed and updated as indicated. Interim medical history since our last visit reviewed. Allergies and medications reviewed and updated.  Review of Systems  Constitutional: Negative for fever and chills.  HENT: Negative for ear discharge and ear pain.   Eyes: Negative for discharge and visual disturbance.  Respiratory: Positive for chest  tightness and shortness of breath. Negative for wheezing.   Cardiovascular: Positive for leg swelling. Negative for chest pain and palpitations.  Gastrointestinal: Negative for abdominal pain, diarrhea and constipation.  Genitourinary: Negative for difficulty urinating.  Musculoskeletal: Negative for back pain and gait problem.  Skin: Negative for rash.  Neurological: Negative for syncope, light-headedness and headaches.  All other systems reviewed and are negative.   Per HPI unless specifically indicated above     Medication List       This list is accurate as of: 05/01/15 10:55 AM.  Always use your most recent med list.               aspirin EC 81 MG tablet  Take 81 mg by mouth at bedtime.     cyanocobalamin 500 MCG tablet  Take 500 mcg by mouth daily.     Garlic 7062 MG Caps  Take 3 capsules by mouth every morning.     RED YEAST RICE PO  Take 1 capsule by mouth 2 (two) times daily.           Objective:    BP 164/76 mmHg  Pulse 60  Temp(Src) 96.9 F (36.1 C) (Oral)  Ht _1  (1.905 m)  Wt 180 lb (81.647 kg)  BMI 22.50 kg/m2  Wt Readings from Last 3 Encounters:  05/01/15 180 lb (81.647 kg)  06/24/14 183 lb (83.008 kg)  05/23/14 175 lb (79.379 kg)    Physical Exam  Constitutional: He is oriented to person, place, and time. He appears well-developed and well-nourished. No distress.  Eyes: Conjunctivae and  EOM are normal. Pupils are equal, round, and reactive to light. Right eye exhibits no discharge. No scleral icterus.  Neck: No tracheal deviation present.  Cardiovascular: Normal rate, regular rhythm, S1 normal, S2 normal, normal heart sounds and intact distal pulses.  Exam reveals no friction rub.   No murmur heard. Pulmonary/Chest: Effort normal and breath sounds normal. No respiratory distress. He has no wheezes.  Abdominal: Soft. He exhibits no distension. There is no tenderness.  Musculoskeletal: Normal range of motion. He exhibits no edema.    Lymphadenopathy:    He has no cervical adenopathy.  Neurological: He is alert and oriented to person, place, and time. Coordination normal.  Skin: Skin is warm and dry. No rash noted. He is not diaphoretic.  Psychiatric: He has a normal mood and affect. His behavior is normal.  Vitals reviewed.   Results for orders placed or performed in visit on 05/01/15  POCT UA - Microalbumin  Result Value Ref Range   Microalbumin Ur, POC 20 mg/L  POCT glycosylated hemoglobin (Hb A1C)  Result Value Ref Range   Hemoglobin A1C 6.7    EKG: normal sinus rhythm, ST depression in V5 V6 is new, Q waves in 2, 3, aVF.  Pulmonary function testing: Results from spirometry showed that patient has an FEV1 of less than 64% or 2.79 shows that this is significantly lower than the predicted that the patient likely has some component of COPD.    Assessment & Plan:   Problem List Items Addressed This Visit      Cardiovascular and Mediastinum   Essential hypertension, benign    Hypertension is uncontrolled, may she is not on any medications and will start once a day. Allergy to lisinopril as cough will try an ARB.      Relevant Medications   irbesartan (AVAPRO) tablet 75 mg   Other Relevant Orders   BMP8+EGFR     Other   Diabetes type 2, uncontrolled    Patient had an A1c that was 6.7 today showing that he has developed full-blown diabetes will discuss starting metformin. Will refer to ophthalmology, foot exam was performed today without any loss of feeling today.      Relevant Medications   metFORMIN (GLUCOPHAGE) tablet 500 mg (Start on 05/01/2015  5:00 PM)   irbesartan (AVAPRO) tablet 75 mg   Other Relevant Orders   POCT UA - Microalbumin (Completed)   POCT glycosylated hemoglobin (Hb A1C) (Completed)   Microalbumin, urine   Exertional dyspnea    Patient has increased shortness of breath while ambulating even going up and down a single flight of stairs is causing that now. History of smoking,  hyperlipidemia, hypertension. We'll get an EKG and pulmonary function testing and also will get an chest x-ray. Because of EKG changes showing Q waves and new ST depression in V5 V6 along with this patient's symptoms of exertional dyspnea we are sending him to the hospital for ACS rule out.      Relevant Orders   DG Chest 2 View   PR BREATHING CAPACITY TEST    Other Visit Diagnoses    Atherosclerosis of native coronary artery of native heart without angina pectoris    -  Primary    Relevant Medications    irbesartan (AVAPRO) tablet 75 mg        Follow up plan: No Follow-up on file.  Caryl Pina, MD [facility] 05/01/2015, 10:55 AM

## 2015-05-01 NOTE — ED Notes (Signed)
Pt placed in gown and in bed. Pt monitored by pulse ox, bp cuff, and 12-lead. 

## 2015-05-01 NOTE — Assessment & Plan Note (Signed)
Hypertension is uncontrolled, may she is not on any medications and will start once a day. Allergy to lisinopril as cough will try an ARB.

## 2015-05-01 NOTE — Assessment & Plan Note (Addendum)
Patient has increased shortness of breath while ambulating even going up and down a single flight of stairs is causing that now. History of smoking, hyperlipidemia, hypertension. We'll get an EKG and pulmonary function testing and also will get an chest x-ray. Because of EKG changes showing Q waves and new ST depression in V5 V6 along with this patient's symptoms of exertional dyspnea we are sending him to the hospital for ACS rule out.

## 2015-05-01 NOTE — H&P (Signed)
[facility] History and Physical                                                                                    Devinn Hurwitz, is a 80 y.o. male  MRN: [id number]   DOB - 1935-04-13  Admit Date - 05/01/2015  Outpatient Primary MD for the patient is Chevis Pretty, FNP  Referring MD: Wilson Singer / ER  With History of -  Past Medical History  Diagnosis Date  . Essential hypertension, benign      Hospital admission 10/13  . Abnormal ECG   . Type 2 diabetes mellitus   . Coronary atherosclerosis of native coronary artery     Based on Myoview - managing medically  . Dementia     Possible      Past Surgical History  Procedure Laterality Date  . Hernia repair    . Prostate surgery    . Knee surgery    . Hip surgery      in for   Chief Complaint  Patient presents with  . Shortness of Breath     HPI This is a year-old male patient history of hypertension not on medications prior to admission, diabetes diet controlled, single-vessel CAD and dementia. He was sent over from his primary care physician's office via EMS with complaints related to progressive dyspnea on exertion. Patient noticed symptoms were typically when walking up a flight of stairs. No chest pain or rest dyspnea. He's had some intermittent lower extremity swelling for several months as well. He also noticed symptoms consistent with paroxysmal nocturnal dyspnea in the past 2 weeks.  In the ER patient's temperature was 97.7 with a pulse rate is 74 respirations 18 with remain saturations 98%, BP was elevated at 195/104. Chest x-ray revealed mild CHF with interstitial pulmonary edema superimposed on chronic emphysema with a small area of airspace opacity in the right costophrenic angle likely representing focal unilateral edema. Patient was given IV Lasix 40 mg in the ER and has begun to diurese with subsequent decrease in blood pressure to 171/99. Since arrival to the ER patient has diuresed 1200 mL.  Laboratory data unremarkable except for mildly elevated glucose of 118, BNP 1031. Troponin was normal at 0.00, CBC within normal limits for patient noting mild thrombus cytopenia which is chronic for this patient. EKG stable with chronic ST segment depression in leads V4 and V5.   Review of Systems   In addition to the HPI above,  No Fever-chills, myalgias or other constitutional symptoms No Headache, changes with Vision or hearing, new weakness, tingling, numbness in any extremity, No problems swallowing food or Liquids, indigestion/reflux No Chest pain, Cough or palpitations No Abdominal pain, N/V; no melena or hematochezia, no dark tarry stools, Bowel movements are regular, No dysuria, hematuria or flank pain No new skin rashes, lesions, masses or bruises, No new joints pains-aches No recent weight gain or loss No polyuria, polydypsia or polyphagia,  *A full 10 point Review of Systems was done, except as stated above, all other Review of Systems were negative.  Social History Social History  Substance Use Topics  . Smoking status: Former Smoker -- 1.00 packs/day  for 15 years    Types: Cigarettes    Quit date: 01/12/1967  . Smokeless tobacco: Never Used  . Alcohol Use: No    Resides at: Private residence  Lives with: Alone  Ambulatory status: With cane   Family History Family History  Problem Relation Age of Onset  . Heart attack Mother   . COPD "Enlarged heart" Father Father       Prior to Admission medications   Medication Sig Start Date End Date Taking? Authorizing Provider  aspirin EC 81 MG tablet Take 81 mg by mouth at bedtime.  07/08/12  Yes Bonnielee Haff, MD  cyanocobalamin 500 MCG tablet Take 500 mcg by mouth daily.   Yes Historical Provider, MD  Garlic 9242 MG CAPS Take 3 capsules by mouth every morning.    Yes Historical Provider, MD  Red Yeast Rice Extract (RED YEAST RICE PO) Take 1 capsule by mouth 2 (two) times daily.   Yes Historical Provider, MD     Allergies  Allergen Reactions  . Lisinopril Cough    Physical Exam  Vitals  Blood pressure 171/99, pulse 66, temperature 97.7 F (36.5 C), temperature source Oral, resp. rate 15, height _0  (1.905 m), weight 180 lb (81.647 kg), SpO2 97 %.   General:  In no acute distress, appears healthy and well nourished  Psych:  Normal affect, Denies Suicidal or Homicidal ideations, Awake Alert, Oriented X 3. Seems to have some short-term memory deficits in regards to recalling information presented in reference to his acute medical problems  Neuro:   No focal neurological deficits, CN II through XII intact, Strength 5/5 all 4 extremities, Sensation intact all 4 extremities.  ENT:  Ears and Eyes appear Normal, Conjunctivae clear, PERL. Moist oral mucosa without erythema or exudates.  Neck:  Supple, No lymphadenopathy appreciated  Respiratory:  Symmetrical chest wall movement, Good air movement bilaterally, faint bibasilar spur tori crackles. Room Air  Cardiac:  RRR, No Murmurs, no LE edema noted, no JVD, No carotid bruits, peripheral pulses palpable at 2+  Abdomen:  Positive bowel sounds, Soft, Non tender, Non distended,  No masses appreciated, no obvious hepatosplenomegaly  Skin:  No Cyanosis, Normal Skin Turgor, No Skin Rash or Bruise.  Extremities: Symmetrical without obvious trauma or injury,  no effusions.  Data Review  CBC  Recent Labs Lab 05/01/15 1352  WBC 5.9  HGB 12.8*  HCT 38.4*  PLT 122*  MCV 90.4  MCH 30.1  MCHC 33.3  RDW 13.8  LYMPHSABS 1.8  MONOABS 0.4  EOSABS 0.2  BASOSABS 0.0    Chemistries   Recent Labs Lab 05/01/15 1352  NA 139  K 4.3  CL 107  CO2 24  GLUCOSE 118*  BUN 13  CREATININE 1.00  CALCIUM 9.1    estimated creatinine clearance is 70.3 mL/min (by C-G formula based on Cr of 1).  No results for input(s): TSH, T4TOTAL, T3FREE, THYROIDAB in the last 72 hours.  Invalid input(s): FREET3  Coagulation profile No results for  input(s): INR, PROTIME in the last 168 hours.  No results for input(s): DDIMER in the last 72 hours.  Cardiac Enzymes No results for input(s): CKMB, TROPONINI, MYOGLOBIN in the last 168 hours.  Invalid input(s): CK  Invalid input(s): POCBNP  Urinalysis    Component Value Date/Time   COLORURINE YELLOW 01/29/2014 1850   APPEARANCEUR HAZY* 01/29/2014 1850   LABSPEC >1.030* 01/29/2014 1850   PHURINE 5.0 01/29/2014 1850   GLUCOSEU NEGATIVE 01/29/2014 1850   HGBUR TRACE*  01/29/2014 [place] 01/29/2014 1850   KETONESUR TRACE* 01/29/2014 1850   PROTEINUR 30* 01/29/2014 1850   UROBILINOGEN 0.2 01/29/2014 1850   NITRITE NEGATIVE 01/29/2014 1850   LEUKOCYTESUR TRACE* 01/29/2014 1850    Imaging results:   Dg Chest 2 View  05/01/2015   CLINICAL DATA:  Short of breath with exertion.  EXAM: CHEST  2 VIEW  COMPARISON:  01/29/2014.  FINDINGS: Emphysema is present. Flattening of the hemidiaphragms. Cardiopericardial silhouette appears similar to prior exam, upper limits of normal for projection. Aortic arch atherosclerosis. There is pulmonary vascular congestion and interstitial pulmonary edema on today's exam. Small focus of opacity is present near the RIGHT costophrenic angle which may represent a small focus of alveolar edema. Bronchopneumonia can also produce this appearance.  IMPRESSION: Mild CHF with interstitial pulmonary edema superimposed on chronic emphysema. Small area of airspace opacity near the RIGHT costophrenic angle could represent alveolar edema or bronchopneumonia.   Electronically Signed   By: Dereck Ligas M.D.   On: 05/01/2015 14:08     EKG: (Independently reviewed) sinus rhythm Q waves in leads 3 and aVF with T-wave inversion in the inferior lateral leads, voltage criteria met for LVH-EKG unchanged compared to previous May 2015   Assessment & Plan  Principal Problem:   Acute diastolic heart failure, NYHA class 1 -Admit to telemetry -Not  hypoxemic -Check echocardiogram -Responding well to 1 time dose of Lasix; we'll defer additional doses of Lasix until echocardiogram returned -Begin hydralazine 25 mg twice a day -Old medication records reveal patient previously on ARB/Avapro but this was discontinued due to cough and we will not reorder ARB or ACE inhibitor at this junction -Acute heart failure although most likely diastolic we'll hold on beta blocker at this juncture  Active Problems:   Hypertensive urgency -Patient was not on any hypertensive medications prior to admission-see above regarding previous medications -Hydralazine initiated as above -We'll need to adjust and/or change any hypertensive medications based on results of echocardiogram    Coronary atherosclerosis of native coronary artery -Currently asymptomatic and EKG essentially unchanged since 2015 -As precaution cycle troponin -Monitor for regional wall motion abnormalities on echocardiogram    Diabetes type 2, controlled -Previously was on metformin but appears to be diet-controlled prior to this admission -Hemoglobin A1c checked today at primary care physician's office was 6.7 and given patient's advanced age and dementia this is an appropriate reading -Check CBGs and provide SSI    Dementia -Patient remains at home independently prior to admission -Patient seems to have developed some subtle increase in confusion and restlessness this afternoon so monitor for sundowner syndrome    Thrombocytopenia -Appears to be a chronic problem and current platelets remain stable and within previously documented baselines   COPD -Documented on chest x-ray -Remote tobacco abuse -No wheezing or other COPD exacerbation symptoms on exam    DVT Prophylaxis: Lovenox  Family Communication: No family at bedside    Code Status:  Full code  Condition: Stable   Discharge disposition: Anticipate likely discharge home within next 24 hours pending resolution of  heart failure symptoms; discharge may also be dependent upon echocardiogram results  Time spent in minutes : 60      ELLIS,ALLISON L. ANP on 05/01/2015 at 5:06 PM  Between 7am to 7pm - Pager - [phone]  After 7pm go to [email or web address] - password [email or web address]  And look for the night coverage person covering me after hours  [facility]  Addendum  I personally  evaluated patient on 05/01/2015 and agree with above findings. Mr. Walraven is a pleasant 80 year old gentleman with a past medical history of dementia, coronary artery disease, diabetes mellitus who was referred to the emergency department from his primary care physician's office. Patient reporting progressively worsening dyspnea associated with bilateral pedal edema over the past 3 weeks. Patient had also reported having nocturnal symptoms. Workup in the emergency department included a chest x-ray which showed findings consistent with pulmonary edema. Symptoms consistent with congestive heart failure. He had good diuresis after being given 40 mg of IV Lasix in the emergency department. On exam lungs were clear to auscultation, did not have wheezing rhonchi or rales. Had trace edema to lower extremities. Will obtain a transthoracic echocardiogram. Patient hypertensive initially having a blood pressure 195/104. Will add imdur 15 mg by mouth daily to his regimen.

## 2015-05-01 NOTE — Assessment & Plan Note (Signed)
Patient had an A1c that was 6.7 today showing that he has developed full-blown diabetes will discuss starting metformin. Will refer to ophthalmology, foot exam was performed today without any loss of feeling today.

## 2015-05-02 ENCOUNTER — Observation Stay (HOSPITAL_COMMUNITY): Payer: Medicare Other

## 2015-05-02 DIAGNOSIS — I509 Heart failure, unspecified: Secondary | ICD-10-CM | POA: Diagnosis not present

## 2015-05-02 DIAGNOSIS — D696 Thrombocytopenia, unspecified: Secondary | ICD-10-CM | POA: Diagnosis not present

## 2015-05-02 DIAGNOSIS — I2510 Atherosclerotic heart disease of native coronary artery without angina pectoris: Secondary | ICD-10-CM | POA: Diagnosis not present

## 2015-05-02 DIAGNOSIS — I5031 Acute diastolic (congestive) heart failure: Secondary | ICD-10-CM

## 2015-05-02 DIAGNOSIS — E119 Type 2 diabetes mellitus without complications: Secondary | ICD-10-CM | POA: Diagnosis not present

## 2015-05-02 DIAGNOSIS — I10 Essential (primary) hypertension: Secondary | ICD-10-CM

## 2015-05-02 LAB — BMP8+EGFR
BUN/Creatinine Ratio: 15 (ref 10–22)
BUN: 15 mg/dL (ref 8–27)
CHLORIDE: 103 mmol/L (ref 97–108)
CO2: 23 mmol/L (ref 18–29)
Calcium: 9.4 mg/dL (ref 8.6–10.2)
Creatinine, Ser: 1.03 mg/dL (ref 0.76–1.27)
GFR calc Af Amer: 80 mL/min/{1.73_m2} (ref >59)
GFR calc non Af Amer: 69 mL/min/{1.73_m2} (ref >59)
Glucose: 126 mg/dL — ABNORMAL HIGH (ref 65–99)
Potassium: 5 mmol/L (ref 3.5–5.2)
Sodium: 143 mmol/L (ref 134–144)

## 2015-05-02 LAB — BASIC METABOLIC PANEL
ANION GAP: 10 (ref 5–15)
BUN: 16 mg/dL (ref 6–20)
CHLORIDE: 104 mmol/L (ref 101–111)
CO2: 25 mmol/L (ref 22–32)
Calcium: 9.2 mg/dL (ref 8.9–10.3)
Creatinine, Ser: 1.06 mg/dL (ref 0.61–1.24)
GFR calc Af Amer: >60 mL/min (ref >60)
Glucose, Bld: 120 mg/dL — ABNORMAL HIGH (ref 65–99)
POTASSIUM: 4.9 mmol/L (ref 3.5–5.1)
SODIUM: 139 mmol/L (ref 135–145)

## 2015-05-02 LAB — GLUCOSE, CAPILLARY
GLUCOSE-CAPILLARY: 125 mg/dL — AB (ref 65–99)
GLUCOSE-CAPILLARY: 120 mg/dL — AB (ref 65–99)
Glucose-Capillary: 142 mg/dL — ABNORMAL HIGH (ref 65–99)
Glucose-Capillary: 91 mg/dL (ref 65–99)

## 2015-05-02 LAB — MICROALBUMIN, URINE: Microalbumin, Urine: 18.4 ug/mL

## 2015-05-02 LAB — TROPONIN I: Troponin I: 0.04 ng/mL — ABNORMAL HIGH (ref <0.031)

## 2015-05-02 MED ORDER — FUROSEMIDE 40 MG PO TABS
40.0000 mg | ORAL_TABLET | Freq: Every day | ORAL | Status: DC
  Filled 2015-05-02: qty 1

## 2015-05-02 MED ORDER — POTASSIUM CHLORIDE CRYS ER 20 MEQ PO TBCR
20.0000 meq | EXTENDED_RELEASE_TABLET | Freq: Every day | ORAL | Status: DC
  Administered 2015-05-02: 20 meq via ORAL
  Filled 2015-05-02: qty 1

## 2015-05-02 MED ORDER — FUROSEMIDE 10 MG/ML IJ SOLN
20.0000 mg | Freq: Once | INTRAMUSCULAR | Status: AC
  Administered 2015-05-02: 20 mg via INTRAVENOUS
  Filled 2015-05-02: qty 2

## 2015-05-02 MED ORDER — ISOSORBIDE MONONITRATE 15 MG HALF TABLET
15.0000 mg | ORAL_TABLET | Freq: Every day | ORAL | Status: DC
  Administered 2015-05-03: 15 mg via ORAL
  Filled 2015-05-02: qty 1

## 2015-05-02 MED ORDER — FUROSEMIDE 20 MG PO TABS: 20.0000 mg | ORAL_TABLET | Freq: Every day | ORAL | Status: DC

## 2015-05-02 NOTE — Evaluation (Signed)
Physical Therapy Evaluation Patient Details Name: Ryan [name] MRN: [id number] DOB: August 25, 1935 Today's Date: 05/02/2015   History of Present Illness  80 yo male with onset of SOB and pulmonary edema has been admitted and has elevated troponin, hypertensive urgency.  Going home alone  Clinical Impression  Pt was seen for evaluation of mobility and needs minor help to walk on hallway with SPC.  Has been home alone, needs to occasionally get on stairs to basement.  If pt can tolerate next visit will try stairs but if not then HHPT can do this with him.  Has a need for follow up therapy and pt in agreement to this.    Follow Up Recommendations Home health PT    Equipment Recommendations  None recommended by PT    Recommendations for Other Services       Precautions / Restrictions Precautions Precautions: Fall;Other (comment) (telemetry) Restrictions Weight Bearing Restrictions: No      Mobility  Bed Mobility Overal bed mobility: Modified Independent                Transfers Overall transfer level: Needs assistance Equipment used:  (elevated surface and extra time ) Transfers: Sit to/from [employer] Sit to Stand: Supervision;Min guard;From elevated surface Stand pivot transfers: Min guard       General transfer comment: uses verbal cues from PT  Ambulation/Gait Ambulation/Gait assistance: Min guard Ambulation Distance (Feet): 175 Feet Assistive device: Straight cane;1 person hand held assist (for safety guarding) Gait Pattern/deviations: Decreased step length - right;Decreased step length - left;Step-through pattern;Drifts right/left;Wide base of support Gait velocity: reduced Gait velocity interpretation: Below normal speed for age/gender    Stairs            Wheelchair Mobility    Modified Rankin (Stroke Patients Only)       Balance Overall balance assessment: Needs assistance Sitting-balance support: Feet  supported Sitting balance-Leahy Scale: Good     Standing balance support: Single extremity supported Standing balance-Leahy Scale: Fair Standing balance comment: SPC increases standign control                             Pertinent Vitals/Pain Pain Assessment: No/denies pain    Home Living Family/patient expects to be discharged to:: Private residence Living Arrangements: Alone   Type of Home: House Home Access: Level entry     Home Layout: Two level Home Equipment: Cane - single point;Shower seat Additional Comments: has been driving and able to care for himself     Prior Function Level of Independence: Independent with assistive device(s) (SPC outside only)               Hand Dominance   Dominant Hand: Right    Extremity/Trunk Assessment   Upper Extremity Assessment: Overall WFL for tasks assessed           Lower Extremity Assessment: Overall WFL for tasks assessed      Cervical / Trunk Assessment: Normal  Communication   Communication: No difficulties  Cognition Arousal/Alertness: Awake/alert Behavior During Therapy: WFL for tasks assessed/performed Overall Cognitive Status: Within Functional Limits for tasks assessed                      General Comments General comments (skin integrity, edema, etc.): Pt is moving fairly well but needs the support of SPC.  Suspect he furniture walks at home, as he uses [facility] outside.    Exercises  Assessment/Plan    PT Assessment Patient needs continued PT services  PT Diagnosis Abnormality of gait   PT Problem List Decreased range of motion;Decreased activity tolerance;Decreased balance;Decreased mobility;Decreased coordination;Decreased safety awareness;Cardiopulmonary status limiting activity  PT Treatment Interventions DME instruction;Gait training;Stair training;Functional mobility training;Therapeutic activities;Therapeutic exercise;Balance training;Neuromuscular  re-education;Patient/family education   PT Goals (Current goals can be found in the Care Plan section) Acute Rehab PT Goals Patient Stated Goal: to get back to usual activity PT Goal Formulation: With patient Time For Goal Achievement: 05/16/15 Potential to Achieve Goals: Good    Frequency Min 3X/week   Barriers to discharge Inaccessible home environment;Decreased caregiver support      Co-evaluation               End of Session   Activity Tolerance: Patient tolerated treatment well;Patient limited by fatigue Patient left: in bed;with call bell/phone within reach (sitting bedside and supine) Nurse Communication: Mobility status    Functional Assessment Tool Used: clinical judgment Functional Limitation: Mobility: Walking and moving around Mobility: Walking and Moving Around Current Status (Z6109): At least 1 percent but less than 20 percent impaired, limited or restricted Mobility: Walking and Moving Around Goal Status [phone]): At least 1 percent but less than 20 percent impaired, limited or restricted    Time: 0981-1914 PT Time Calculation (min) (ACUTE ONLY): 24 min   Charges:   PT Evaluation $Initial PT Evaluation Tier I: 1 Procedure PT Treatments $Gait Training: 8-22 mins   PT G Codes:   PT G-Codes **NOT FOR INPATIENT CLASS** Functional Assessment Tool Used: clinical judgment Functional Limitation: Mobility: Walking and moving around Mobility: Walking and Moving Around Current Status (N8295): At least 1 percent but less than 20 percent impaired, limited or restricted Mobility: Walking and Moving Around Goal Status [phone]): At least 1 percent but less than 20 percent impaired, limited or restricted    Ivar Drape 05/02/2015, 11:15 AM   Samul Dada, PT MS Acute Rehab Dept. Number: [facility] [id number] and [facility] [phone]

## 2015-05-02 NOTE — Evaluation (Signed)
Clinical/Bedside Swallow Evaluation Patient Details  Name: Ryan [name] MRN: [id number] Date of Birth: 12-06-1934  Today's Date: 05/02/2015 Time: SLP Start Time (ACUTE ONLY): 1400 SLP Stop Time (ACUTE ONLY): 1417 SLP Time Calculation (min) (ACUTE ONLY): 17 min  Past Medical History:  Past Medical History  Diagnosis Date  . Essential hypertension, benign      Hospital admission 10/13  . Abnormal ECG   . Type 2 diabetes mellitus   . Coronary atherosclerosis of native coronary artery     Based on Myoview - managing medically  . Dementia     Possible   Past Surgical History:  Past Surgical History  Procedure Laterality Date  . Hernia repair    . Prostate surgery    . Knee surgery    . Hip surgery     HPI:  83 old male patient history of hypertension, DM, single-vessel CAD and (posible) dementia admitted from primary care physician's office with complaints related to progressive dyspnea on exertion. . Chest x-ray revealed mild CHF with interstitial pulmonary edema superimposed on chronic emphysema. Small area of airspace opacity near the RIGHT costophrenic angle could represent alveolar edema or bronchopneumonia. MBS 01/31/14 with rec for Dys 3/nectar liquids. MBS 02/25/14 with delayed swallow initiation and aspiration of thin with spontanous removal during the swallow. Reg texture and thin liquids recomended, no straws pills in puree   Assessment / Plan / Recommendation Clinical Impression  Pt has history of dysphagia with precautions regular texture, thin liquids and precautions recommended following MBS 02/2014 (pills whole in applesauce, no straws and small sips to prevent penetration with thin). Pt reports no significant difficulty in swallow function prior to admission, however he had "forgotten his strategies". No s/s aspiration with thin, puree or solid, therefore recommend continue regular/thin and aformentioned swallow precautions. No follow up needed.     Aspiration  Risk  Moderate    Diet Recommendation Age appropriate regular solids;Thin   Medication Administration: Whole meds with puree Compensations: Slow rate;Small sips/bites    Other  Recommendations Oral Care Recommendations: Oral care BID   Follow Up Recommendations       Frequency and Duration        Pertinent Vitals/Pain none         Swallow Study Prior Functional Status  Type of Home: House       Oral/Motor/Sensory Function Overall Oral Motor/Sensory Function: Appears within functional limits for tasks assessed   Ice Chips Ice chips: Not tested   Thin Liquid Thin Liquid: Within functional limits Presentation: Cup    Nectar Thick Nectar Thick Liquid: Not tested   Honey Thick Honey Thick Liquid: Not tested   Puree Puree: Within functional limits   Solid   GO Functional Assessment Tool Used: skilled clinical judgement Functional Limitations: Swallowing Swallow Current Status (F6384): At least 1 percent but less than 20 percent impaired, limited or restricted Swallow Goal Status [phone]): At least 1 percent but less than 20 percent impaired, limited or restricted Swallow Discharge Status [phone]): At least 1 percent but less than 20 percent impaired, limited or restricted  Solid: Within functional limits       Royce Macadamia 05/02/2015,2:53 PM

## 2015-05-02 NOTE — Progress Notes (Signed)
Resting in bed Safetyy precaution monitored

## 2015-05-02 NOTE — Progress Notes (Addendum)
PATIENT DETAILS Name: Ryan [name] Age: 80 y.o. Sex: male Date of Birth: 1935/03/12 Admit Date: 05/01/2015 Admitting Physician Jeralyn Bennett, MD WUJ:WJXBJY,NWGN MARGARET, FNP  Subjective: Shortness of breath has significantly improved.  Assessment/Plan: Principal Problem: Suspected Acute diastolic heart failure: Much improved following intravenous Lasix, will give 1 more dose of Lasix today and then transitioned to oral Lasix in a.m. Await 2-D echocardiogram-but if if blood pressure tolerates-may need addition of low-dose beta blocker at some point.  Active Problems: Essential hypertension: Pressure elevated on admission, apparently not been any prior antihypertensives prior to this admission-for now continue with hydralazine and Imdur-await echocardiogram-may need to change to beta blocker and ACE inhibitor. Follow BP trend.  Abnormal EKG: As diffuse T-wave inversions-these appear to be old-troponins negative, significantly improved. Await echocardiogram.  Addendum 11:40 am: Echocardiogram positive for wall motion abnormalities-reviewed prior outpatient cardiology note-had a low risk stress test which had some ischemia in the apex-plans were to manage medically until he developed symptoms (admitted with shortness of breath)-have consulted cardiology.  Type 2 diabetes: Hemoglobin A1c on 8/18 6.7. CBGs stable with SSI-continue with lifestyle/dietary modifications on discharge  Probable Mild cognitive dysfunction/early dementia: PT eval, claims that his neighbor keeps a very close eye on him, will attempt to reach patients niece -left message  Coronary atherosclerosis of native coronary artery: Continue with aspirin  Thrombocytopenia: Mild-appears to be a chronic issue. Further workup deferred to the outpatient setting-monitor while on prophylactic Lovenox  Disposition: Remain inpatient-suspect home 8/20 with home health services  Antimicrobial agents  See  below  Anti-infectives    None      DVT Prophylaxis: Prophylactic Lovenox-watch platelets  Code Status: Full code   Family Communication None at bedside-left msg for niece-Ms Gordy  Procedures: None  CONSULTS:  None  Time spent 30 minutes-Greater than 50% of this time was spent in counseling, explanation of diagnosis, planning of further management, and coordination of care.  MEDICATIONS: Scheduled Meds: . aspirin EC  81 mg Oral QHS  . enoxaparin (LOVENOX) injection  40 mg Subcutaneous Q24H  . furosemide  20 mg Intravenous Once  . [START ON 05/03/2015] furosemide  20 mg Oral Daily  . hydrALAZINE  25 mg Oral Q12H  . insulin aspart  0-5 Units Subcutaneous QHS  . insulin aspart  0-9 Units Subcutaneous TID WC  . isosorbide mononitrate  15 mg Oral Daily  . potassium chloride  20 mEq Oral Daily  . sodium chloride  3 mL Intravenous Q12H   Continuous Infusions:  PRN Meds:.sodium chloride, acetaminophen, ondansetron (ZOFRAN) IV, sodium chloride    PHYSICAL EXAM: Vital signs in last 24 hours: Filed Vitals:   05/01/15 1734 05/01/15 2043 05/02/15 0142 05/02/15 0556  BP: 154/88 150/69 115/62 115/64  Pulse: 67 63 63 64  Temp: 97.6 F (36.4 C) 97.6 F (36.4 C) 97.8 F (36.6 C) 98.2 F (36.8 C)  TempSrc: Oral Oral Oral Oral  Resp: Height:  (1.905 m)     Weight: 76.885 kg (169 lb 8 oz)   76.114 kg (167 lb 12.8 oz)  SpO2: 100% 99% 100% 99%    Weight change:  Filed Weights   05/01/15 1251 05/01/15 1734 05/02/15 0556  Weight: 81.647 kg (180 lb) 76.885 kg (169 lb 8 oz) 76.114 kg (167 lb 12.8 oz)   Body mass index is 20.97 kg/(m^2).   Gen Exam: Awake and alert with  clear speech.   Neck: Supple, No JVD.   Chest: B/L Clear.   CVS: S1 S2 Regular, no murmurs.  Abdomen: soft, BS +, non tender, non distended.  Extremities: no edema, lower extremities warm to touch. Neurologic: Non Focal.   Skin: No Rash.   Wounds: N/A.   Intake/Output from previous  day:  Intake/Output Summary (Last 24 hours) at 05/02/15 1121 Last data filed at 05/02/15 0940  Gross per 24 hour  Intake    460 ml  Output   2900 ml  Net  -2440 ml     LAB RESULTS: CBC  Recent Labs Lab 05/01/15 1352  WBC 5.9  HGB 12.8*  HCT 38.4*  PLT 122*  MCV 90.4  MCH 30.1  MCHC 33.3  RDW 13.8  LYMPHSABS 1.8  MONOABS 0.4  EOSABS 0.2  BASOSABS 0.0    Chemistries   Recent Labs Lab 05/01/15 0948 05/01/15 1352 05/02/15 0336  NA 143 139 139  K 5.0 4.3 4.9  CL 103 107 104  CO2 23 24 25   GLUCOSE 126* 118* 120*  BUN 15 13 16   CREATININE 1.03 1.00 1.06  CALCIUM 9.4 9.1 9.2    CBG:  Recent Labs Lab 05/01/15 2040 05/02/15 0618  GLUCAP 121* 125*    GFR Estimated Creatinine Clearance: 61.8 mL/min (by C-G formula based on Cr of 1.06).  Coagulation profile No results for input(s): INR, PROTIME in the last 168 hours.  Cardiac Enzymes  Recent Labs Lab 05/01/15 1930 05/02/15 0336 05/02/15 0850  TROPONINI 0.03 0.04* <0.03    Invalid input(s): POCBNP No results for input(s): DDIMER in the last 72 hours.  Recent Labs  05/01/15 1001  HGBA1C 6.7   No results for input(s): CHOL, HDL, LDLCALC, TRIG, CHOLHDL, LDLDIRECT in the last 72 hours. No results for input(s): TSH, T4TOTAL, T3FREE, THYROIDAB in the last 72 hours.  Invalid input(s): FREET3 No results for input(s): VITAMINB12, FOLATE, FERRITIN, TIBC, IRON, RETICCTPCT in the last 72 hours. No results for input(s): LIPASE, AMYLASE in the last 72 hours.  Urine Studies No results for input(s): UHGB, CRYS in the last 72 hours.  Invalid input(s): UACOL, UAPR, USPG, UPH, UTP, UGL, UKET, UBIL, UNIT, UROB, ULEU, UEPI, UWBC, URBC, UBAC, CAST, UCOM, BILUA  MICROBIOLOGY: No results found for this or any previous visit (from the past 240 hour(s)).  RADIOLOGY STUDIES/RESULTS: Dg Chest 2 View  05/01/2015   CLINICAL DATA:  Short of breath with exertion.  EXAM: CHEST  2 VIEW  COMPARISON:  01/29/2014.   FINDINGS: Emphysema is present. Flattening of the hemidiaphragms. Cardiopericardial silhouette appears similar to prior exam, upper limits of normal for projection. Aortic arch atherosclerosis. There is pulmonary vascular congestion and interstitial pulmonary edema on today's exam. Small focus of opacity is present near the RIGHT costophrenic angle which may represent a small focus of alveolar edema. Bronchopneumonia can also produce this appearance.  IMPRESSION: Mild CHF with interstitial pulmonary edema superimposed on chronic emphysema. Small area of airspace opacity near the RIGHT costophrenic angle could represent alveolar edema or bronchopneumonia.   Electronically Signed   By: Andreas Newport M.D.   On: 05/01/2015 14:08    Jeoffrey Massed, MD  [facility] Pager:[phone]  If 7PM-7AM, please contact night-coverage [email or web address] Password [facility] 05/02/2015, 11:21 AM

## 2015-05-02 NOTE — Care Management Note (Signed)
Case Management Note  Patient Details  Name: MCKENZY EALES MRN: [id number] Date of Birth: 1935-07-21  Subjective/Objective:       Patient admitted with Acute CHF           Action/Plan: CM talked to patient about DCP; Patient lives alone, independent of ADL's, continue to drive, grocery shop. He has a niece that is very supportive; HHC choice offered, patient does not want any HHC at this time. Stated " I can do my own exercises."He use a cane at home. CM will continue to follow for DCP.  Expected Discharge Date:    possibly 05/03/2015              Expected Discharge Plan:  Home/Self Care  In-House Referral:     Discharge planning Services  CM Consult  Choice offered to:  Patient  HH Arranged:  Patient Refused  Status of Service:  In process, will continue to follow  Reola Mosher [phone] 05/02/2015, 1:18 PM

## 2015-05-02 NOTE — Consult Note (Signed)
Primary Cardiologist: Mcdowell Reason for Consultation:    HPI:  Ryan [name] is a 80 year old male with presumed CAD, HTN (non-compliant with meds), DM2, COPD, CAD and dementia.   Based on the notes he has not had a cardiac cath but has presumed CAD based on a Myoview in  December 2013 showing  Inferior apical ischemia. LVEF is 65%. He was treated medically.   He was sent over from his primary care physician's office to the ER due to dyspnea on exertion, PND and LE edema. Denied CP.  BP was elevated at 195/104. Chest x-ray revealed mild CHF with interstitial pulmonary edema superimposed on chronic emphysema with a small area of airspace opacity in the right costophrenic angle likely representing focal unilateral edema. BNP 1031. EKG stable with LVH chronic repol abnormalities.   Admitted to hospitalist service. Feeling better with diuresis. Says he lives alone at home and still drives. Doesn't remember anyone ever telling him to take BP meds. Although in Dr. Ival Bible previous notes, Mr. Terrones said he was afraid the BP medicines would kill him. Says he only takes  Red yeast rice and garlic.   Echo 45-50% with inferior HK.   SBP now 100-120   Review of Systems:     Cardiac Review of Systems: {Y] = yes  = no  Chest Pain [    ]  Resting SOB [   ] Exertional SOB  Cove.Etienne  ]  Orthopnea [ y ]   Pedal Edema [  y ]    Palpitations [  ] Syncope  [  ]   Presyncope [   ]  General Review of Systems: [Y] = yes [  ]=no Constitional: recent weight change [  ]; anorexia [  ]; fatigue [  ]; nausea [  ]; night sweats [  ]; fever [  ]; or chills [  ];                                                                     Eyes : blurred vision [  ]; diplopia [   ]; vision changes [  ];  Amaurosis fugax[  ]; Resp: cough [  ];  wheezing[  ];  hemoptysis[  ];  PND [ y ];  GI:  gallstones[  ], vomiting[  ];  dysphagia[  ]; melena[  ];  hematochezia [  ]; heartburn[  ];   GU: kidney stones [  ];  hematuria[  ];   dysuria [  ];  nocturia[  ]; incontinence [  ];             Skin: rash, swelling[  ];, hair loss[  ];  peripheral edema[  ];  or itching[  ]; Musculosketetal: myalgias[  ];  joint swelling[  ];  joint erythema[  ];  joint pain[ y ];  back pain[  ];  Heme/Lymph: bruising[  ];  bleeding[  ];  anemia[  ];  Neuro: TIA[  ];  headaches[  ];  stroke[  ];  vertigo[  ];  seizures[  ];   paresthesias[  ];  difficulty walking[  ];  Psych:depression[  ]; anxiety[  ];  Endocrine: diabetes[y  ];  thyroid dysfunction[  ];  Other:  Past Medical History  Diagnosis Date  . Essential hypertension, benign      Hospital admission 10/13  . Abnormal ECG   . Type 2 diabetes mellitus   . Coronary atherosclerosis of native coronary artery     Based on Myoview - managing medically  . Dementia     Possible    Facility-administered medications prior to admission  Medication Dose Route Frequency Provider Last Rate Last Dose  . irbesartan (AVAPRO) tablet 75 mg  75 mg Oral Daily Joshua A Dettinger, MD      . metFORMIN (GLUCOPHAGE) tablet 500 mg  500 mg Oral BID WC Elige Radon Dettinger, MD       Medications Prior to Admission  Medication Sig Dispense Refill  . aspirin EC 81 MG tablet Take 81 mg by mouth at bedtime.     . cyanocobalamin 500 MCG tablet Take 500 mcg by mouth daily.    . Garlic 1000 MG CAPS Take 3 capsules by mouth every morning.     . Red Yeast Rice Extract (RED YEAST RICE PO) Take 1 capsule by mouth 2 (two) times daily.       Marland Kitchen aspirin EC  81 mg Oral QHS  . enoxaparin (LOVENOX) injection  40 mg Subcutaneous Q24H  . [START ON 05/03/2015] furosemide  20 mg Oral Daily  . hydrALAZINE  25 mg Oral Q12H  . insulin aspart  0-5 Units Subcutaneous QHS  . insulin aspart  0-9 Units Subcutaneous TID WC  . [START ON 05/03/2015] isosorbide mononitrate  15 mg Oral Daily  . potassium chloride  20 mEq Oral Daily  . sodium chloride  3 mL Intravenous Q12H    Infusions:    Allergies    Allergen Reactions  . Lisinopril Cough    Social History   Social History  . Marital Status: Married    Spouse Name: N/A  . Number of Children: N/A  . Years of Education: N/A   Occupational History  . Not on file.   Social History Main Topics  . Smoking status: Former Smoker -- 1.00 packs/day for 15 years    Types: Cigarettes    Quit date: 01/12/1967  . Smokeless tobacco: Never Used  . Alcohol Use: No  . Drug Use: No  . Sexual Activity: No   Other Topics Concern  . Not on file   Social History Narrative    Family History  Problem Relation Age of Onset  . Heart attack Mother   . COPD Father     PHYSICAL EXAM: Filed Vitals:   05/02/15 1432  BP: 111/69  Pulse: 65  Temp: 97.4 F (36.3 C)  Resp: 20     Intake/Output Summary (Last 24 hours) at 05/02/15 1745 Last data filed at 05/02/15 1432  Gross per 24 hour  Intake    580 ml  Output   1300 ml  Net   -720 ml    General:  Elderly. Lying flat in bed . No respiratory difficulty HEENT: normal Neck: supple. JVP 9-10. Carotids 2+ bilat; no bruits. No lymphadenopathy or thryomegaly appreciated. Cor: PMI nondisplaced. Regular rate & rhythm. No rubs, gallops or murmurs. Lungs: clear with decreased BS throughout Abdomen: soft, nontender, nondistended. No hepatosplenomegaly. No bruits or masses. Good bowel sounds. Extremities: no cyanosis, clubbing, rash, tr-1+ edema Neuro: alert & oriented x 3, cranial nerves grossly intact. moves all 4 extremities w/o difficulty. Affect pleasant.  ECG: NSC with LVH and repol   Results for orders placed or  performed during the hospital encounter of 05/01/15 (from the past 24 hour(s))  Troponin I (q 6hr x 3)     Status: None   Collection Time: 05/01/15  7:30 PM  Result Value Ref Range   Troponin I 0.03 <0.031 ng/mL  Glucose, capillary     Status: Abnormal   Collection Time: 05/01/15  8:40 PM  Result Value Ref Range   Glucose-Capillary 121 (H) 65 - 99 mg/dL   Comment 1  Notify RN    Comment 2 Document in Chart   Troponin I (q 6hr x 3)     Status: Abnormal   Collection Time: 05/02/15  3:36 AM  Result Value Ref Range   Troponin I 0.04 (H) <0.031 ng/mL  Basic metabolic panel     Status: Abnormal   Collection Time: 05/02/15  3:36 AM  Result Value Ref Range   Sodium 139 135 - 145 mmol/L   Potassium 4.9 3.5 - 5.1 mmol/L   Chloride 104 101 - 111 mmol/L   CO2 25 22 - 32 mmol/L   Glucose, Bld 120 (H) 65 - 99 mg/dL   BUN 16 6 - 20 mg/dL   Creatinine, Ser 4.09 0.61 - 1.24 mg/dL   Calcium 9.2 8.9 - 81.1 mg/dL   GFR calc non Af Amer >60 >60 mL/min   GFR calc Af Amer >60 >60 mL/min   Anion gap 10 5 - 15  Glucose, capillary     Status: Abnormal   Collection Time: 05/02/15  6:18 AM  Result Value Ref Range   Glucose-Capillary 125 (H) 65 - 99 mg/dL  Troponin I (q 6hr x 3)     Status: None   Collection Time: 05/02/15  8:50 AM  Result Value Ref Range   Troponin I <0.03 <0.031 ng/mL  Glucose, capillary     Status: Abnormal   Collection Time: 05/02/15 11:21 AM  Result Value Ref Range   Glucose-Capillary 142 (H) 65 - 99 mg/dL   Comment 1 Notify RN    Comment 2 Document in Chart   Glucose, capillary     Status: None   Collection Time: 05/02/15  4:13 PM  Result Value Ref Range   Glucose-Capillary 91 65 - 99 mg/dL   Comment 1 Notify RN    Comment 2 Document in Chart    Dg Chest 2 View  05/01/2015   CLINICAL DATA:  Short of breath with exertion.  EXAM: CHEST  2 VIEW  COMPARISON:  01/29/2014.  FINDINGS: Emphysema is present. Flattening of the hemidiaphragms. Cardiopericardial silhouette appears similar to prior exam, upper limits of normal for projection. Aortic arch atherosclerosis. There is pulmonary vascular congestion and interstitial pulmonary edema on today's exam. Small focus of opacity is present near the RIGHT costophrenic angle which may represent a small focus of alveolar edema. Bronchopneumonia can also produce this appearance.  IMPRESSION: Mild CHF with  interstitial pulmonary edema superimposed on chronic emphysema. Small area of airspace opacity near the RIGHT costophrenic angle could represent alveolar edema or bronchopneumonia.   Electronically Signed   By: Andreas Newport M.D.   On: 05/01/2015 14:08     ASSESSMENT: 1. Acute diastolic HF     --EF 45-50% on echo 2. Severe HTN, poorly controlled 3. DM2 4. H/o abnormal Myoview 5. Hyperkalemia   PLAN/DISCUSSION:  Mr Vangorder has acute diastolic HF in the setting of poorly controlled HTN. Now improved with BP control and diuresis. Echo images reviewed personally. EF low normal with mild inferior HK. Based on  echo and previous Myoview, likely has underlying CAD but has not had any angina.   At this point would focus on treating his HTN and HF. Treat CAD medically. Ideally would treat with b-blocker and ACE but he has had hyperkalemia. BP now controlled on hydralazine 25 bid and imdur + lasix 20 daily. I would prefer once-a-day agents but this regimen seems to be well tolerated and doing the trick for him so will continue with the exception of increasing lasix to 40 daily. Can consider addition of low-dose carvedilol prior to d/c.  Would ask SW and Case Management to make sure they are comfortable with his safety living alone.    Crissie Aloi,MD 5:45 PM

## 2015-05-02 NOTE — Progress Notes (Signed)
  Echocardiogram 2D Echocardiogram has been performed.  Ryan [name] 05/02/2015, 10:09 AM

## 2015-05-03 DIAGNOSIS — I5031 Acute diastolic (congestive) heart failure: Secondary | ICD-10-CM | POA: Diagnosis not present

## 2015-05-03 LAB — BASIC METABOLIC PANEL
ANION GAP: 8 (ref 5–15)
BUN: 22 mg/dL — ABNORMAL HIGH (ref 6–20)
CHLORIDE: 104 mmol/L (ref 101–111)
CO2: 26 mmol/L (ref 22–32)
Calcium: 9.1 mg/dL (ref 8.9–10.3)
Creatinine, Ser: 1.26 mg/dL — ABNORMAL HIGH (ref 0.61–1.24)
GFR, EST NON AFRICAN AMERICAN: 53 mL/min — AB (ref >60)
Glucose, Bld: 115 mg/dL — ABNORMAL HIGH (ref 65–99)
POTASSIUM: 4.1 mmol/L (ref 3.5–5.1)
SODIUM: 138 mmol/L (ref 135–145)

## 2015-05-03 LAB — GLUCOSE, CAPILLARY
GLUCOSE-CAPILLARY: 119 mg/dL — AB (ref 65–99)
GLUCOSE-CAPILLARY: 118 mg/dL — AB (ref 65–99)

## 2015-05-03 MED ORDER — FUROSEMIDE 20 MG PO TABS: 20.0000 mg | ORAL_TABLET | Freq: Every day | ORAL | Status: DC

## 2015-05-03 MED ORDER — INSULIN ASPART 100 UNIT/ML ~~LOC~~ SOLN: 0.0000 [IU] | Freq: Three times a day (TID) | SUBCUTANEOUS | Status: DC

## 2015-05-03 MED ORDER — HYDRALAZINE HCL 50 MG PO TABS: 50.0000 mg | ORAL_TABLET | Freq: Two times a day (BID) | ORAL | Status: DC

## 2015-05-03 MED ORDER — ISOSORBIDE MONONITRATE ER 30 MG PO TB24
15.0000 mg | ORAL_TABLET | Freq: Every day | ORAL | Status: DC
Start: 2015-05-03 — End: 2015-06-09

## 2015-05-03 MED ORDER — HYDRALAZINE HCL 50 MG PO TABS
50.0000 mg | ORAL_TABLET | Freq: Two times a day (BID) | ORAL | Status: DC
Start: 2015-05-03 — End: 2015-06-09

## 2015-05-03 NOTE — Discharge Summary (Signed)
Ryan [name], is a 81 y.o. male  DOB [birth year]  MRN [id number].  Admission date:  05/01/2015  Admitting Physician  Jeralyn Bennett, MD  Discharge Date:  05/03/2015   Primary MD  Bennie Pierini, FNP  Recommendations for primary care physician for things to follow:   Monitor weight, BMP and diuretic dose closely   Admission Diagnosis  Essential hypertension, benign [I10] DOE (dyspnea on exertion) [R06.09] Diabetes type 2, uncontrolled [E11.65] Essential hypertension [I10] Acute congestive heart failure, unspecified congestive heart failure type [I50.9]   Discharge Diagnosis  Essential hypertension, benign [I10] DOE (dyspnea on exertion) [R06.09] Diabetes type 2, uncontrolled [E11.65] Essential hypertension [I10] Acute congestive heart failure, unspecified congestive heart failure type [I50.9]    Principal Problem:   Acute diastolic heart failure, NYHA class 1 Active Problems:   Coronary atherosclerosis of native coronary artery   Hypertensive urgency   Dementia   Diabetes type 2, controlled   Thrombocytopenia      Past Medical History  Diagnosis Date  . Essential hypertension, benign      Hospital admission 10/13  . Abnormal ECG   . Type 2 diabetes mellitus   . Coronary atherosclerosis of native coronary artery     Based on Myoview - managing medically  . Dementia     Possible    Past Surgical History  Procedure Laterality Date  . Hernia repair    . Prostate surgery    . Knee surgery    . Hip surgery         HPI  from the history and physical done on the day of admission:    This is a year-old male patient history of hypertension not on medications prior to admission, diabetes diet controlled, single-vessel CAD and dementia. He was sent over from his primary care physician's  office via EMS with complaints related to progressive dyspnea on exertion. Patient noticed symptoms were typically when walking up a flight of stairs. No chest pain or rest dyspnea. He's had some intermittent lower extremity swelling for several months as well. He also noticed symptoms consistent with paroxysmal nocturnal dyspnea in the past 2 weeks.  In the ER patient's temperature was 97.7 with a pulse rate is 74 respirations 18 with remain saturations 98%, BP was elevated at 195/104. Chest x-ray revealed mild CHF with interstitial pulmonary edema superimposed on chronic emphysema with a small area of airspace opacity in the right costophrenic angle likely representing focal unilateral edema. Patient was given IV Lasix 40 mg in the ER and has begun to diurese with subsequent decrease in blood pressure to 171/99. Since arrival to the ER patient has diuresed 1200 mL. Laboratory data unremarkable except for mildly elevated glucose of 118, BNP 1031. Troponin was normal at 0.00, CBC within normal limits for patient noting mild thrombus cytopenia which is chronic for this patient. EKG stable with chronic ST segment depression in leads V4 and V5.     Hospital Course:     Acute on chronic combined diastolic and systolic heart  failure EF 45%. Responded very well to low-dose Lasix, seen and cleared for discharge by cardiology, now back to baseline without any shortness of breath, no oxygen demand, no edema or rales on exam. Will be placed on low-dose Lasix along with Imdur and hydralazine and discharged home. Request PCP to monitor weight, diuretic dose and BMP closely outpatient cardiology follow-up.   Essential hypertension. Being discharged on combination of hydralazine and Imdur. Request PCP to monitor. ARB on hold due to high potassium on admission and borderline creatinine.   DM type II. Continue home regimen upon discharge.   CAD. Continue aspirin, heart rate too low per cardiology for beta blocker.  Outpatient cardiac follow-up post discharge.   Mild cognitive impairment. Patient wishes to go home he lives alone, PT eval, case manager eval and home health RN and PT ordered.   Discharge Condition: Stable  Follow UP  Follow-up Information    Follow up with Bennie Pierini, FNP. Schedule an appointment as soon as possible for a visit in 1 week.   Specialty:  Nurse Practitioner   Contact information:   [address] [place] [address] [phone]       Follow up with [facility] Schedule an appointment as soon as possible for a visit in 1 week.   Why:  CHF   Contact information:   [address] [place] [address]        Consults obtained - Cards  Diet and Activity recommendation: See Discharge Instructions below  Discharge Instructions       Discharge Instructions    Discharge instructions    Complete by:  As directed   Follow with Primary MD Bennie Pierini, FNP in 7 days   Get CBC, CMP, 2 view Chest X ray checked  by Primary MD next visit.    Activity: As tolerated with Full fall precautions use walker/cane & assistance as needed   Disposition Home     Diet: Heart Healthy   Check your Weight same time everyday, if you gain over 2 pounds, or you develop in leg swelling, experience more shortness of breath or chest pain, call your Primary MD immediately. Follow Cardiac Low Salt Diet and 1.5 lit/day fluid restriction.   On your next visit with your primary care physician please Get Medicines reviewed and adjusted.   Please request your Prim.MD to go over all Hospital Tests and Procedure/Radiological results at the follow up, please get all Hospital records sent to your Prim MD by signing hospital release before you go home.   If you experience worsening of your admission symptoms, develop shortness of breath, life threatening emergency, suicidal or homicidal thoughts you must seek medical attention  immediately by calling 911 or calling your MD immediately  if symptoms less severe.  You Must read complete instructions/literature along with all the possible adverse reactions/side effects for all the Medicines you take and that have been prescribed to you. Take any new Medicines after you have completely understood and accpet all the possible adverse reactions/side effects.   Do not drive, operating heavy machinery, perform activities at heights, swimming or participation in water activities or provide baby sitting services if your were admitted for syncope or siezures until you have seen by Primary MD or a Neurologist and advised to do so again.  Do not drive when taking Pain medications.    Do not take more than prescribed Pain, Sleep and Anxiety Medications  Special Instructions: If you  have smoked or chewed Tobacco  in the last 2 yrs please stop smoking, stop any regular Alcohol  and or any Recreational drug use.  Wear Seat belts while driving.   Please note  You were cared for by a hospitalist during your hospital stay. If you have any questions about your discharge medications or the care you received while you were in the hospital after you are discharged, you can call the unit and asked to speak with the hospitalist on call if the hospitalist that took care of you is not available. Once you are discharged, your primary care physician will handle any further medical issues. Please note that NO REFILLS for any discharge medications will be authorized once you are discharged, as it is imperative that you return to your primary care physician (or establish a relationship with a primary care physician if you do not have one) for your aftercare needs so that they can reassess your need for medications and monitor your lab values.     Increase activity slowly    Complete by:  As directed              Discharge Medications       Medication List    TAKE these medications         aspirin EC 81 MG tablet  Take 81 mg by mouth at bedtime.     cyanocobalamin 500 MCG tablet  Take 500 mcg by mouth daily.     furosemide 20 MG tablet  Commonly known as:  LASIX  Take 1 tablet (20 mg total) by mouth daily.     Garlic 1000 MG Caps  Take 3 capsules by mouth every morning.     hydrALAZINE 50 MG tablet  Commonly known as:  APRESOLINE  Take 1 tablet (50 mg total) by mouth every 12 (twelve) hours.     isosorbide mononitrate 30 MG 24 hr tablet  Commonly known as:  IMDUR  Take 0.5 tablets (15 mg total) by mouth daily.     RED YEAST RICE PO  Take 1 capsule by mouth 2 (two) times daily.        Major procedures and Radiology Reports - PLEASE review detailed and final reports for all details, in brief -   TTE  - Left ventricle: Technically difficult study. There is hypokinesis of base inferior segment. Inferolateral wall difficult to assess. There is hypokinesis of this wall. The EF is 45-50% The cavity size was normal. Wall thickness was increased in a pattern of mild LVH. - Mitral valve: Calcified annulus. There was no significant regurgitation. - Right ventricle: The cavity size was normal. Systolic function was normal.    Dg Chest 2 View  05/01/2015   CLINICAL DATA:  Short of breath with exertion.  EXAM: CHEST  2 VIEW  COMPARISON:  01/29/2014.  FINDINGS: Emphysema is present. Flattening of the hemidiaphragms. Cardiopericardial silhouette appears similar to prior exam, upper limits of normal for projection. Aortic arch atherosclerosis. There is pulmonary vascular congestion and interstitial pulmonary edema on today's exam. Small focus of opacity is present near the RIGHT costophrenic angle which may represent a small focus of alveolar edema. Bronchopneumonia can also produce this appearance.  IMPRESSION: Mild CHF with interstitial pulmonary edema superimposed on chronic emphysema. Small area of airspace opacity near the RIGHT costophrenic angle could  represent alveolar edema or bronchopneumonia.   Electronically Signed   By: Andreas Newport M.D.   On: 05/01/2015 14:08    Micro Results  No results found for this or any previous visit (from the past 240 hour(s)).     Today   Subjective    Ryan [name] today has no headache,no chest abdominal pain,no new weakness tingling or numbness, feels much better wants to go home today.     Objective   Blood pressure 133/66, pulse 64, temperature 97.9 F (36.6 C), temperature source Oral, resp. rate 18, height 6\' 3"  (1.905 m), weight 76.2 kg (167 lb 15.9 oz), SpO2 100 %.   Intake/Output Summary (Last 24 hours) at 05/03/15 1028 Last data filed at 05/03/15 0818  Gross per 24 hour  Intake    960 ml  Output    300 ml  Net    660 ml    Exam Awake Alert, Oriented x 3, No new F.N deficits, Normal affect [place].AT,PERRAL Supple Neck,No JVD, No cervical lymphadenopathy appriciated.  Symmetrical Chest wall movement, Good air movement bilaterally, CTAB RRR,No Gallops,Rubs or new Murmurs, No Parasternal Heave +ve B.Sounds, Abd Soft, Non tender, No organomegaly appriciated, No rebound -guarding or rigidity. No Cyanosis, Clubbing or edema, No new Rash or bruise   Data Review   CBC w Diff: Lab Results  Component Value Date   WBC 5.9 05/01/2015   HGB 12.8* 05/01/2015   HCT 38.4* 05/01/2015   PLT 122* 05/01/2015   LYMPHOPCT 30 05/01/2015   MONOPCT 6 05/01/2015   EOSPCT 4 05/01/2015   BASOPCT 0 05/01/2015    CMP: Lab Results  Component Value Date   NA 138 05/03/2015   NA 143 05/01/2015   K 4.1 05/03/2015   CL 104 05/03/2015   CO2 26 05/03/2015   BUN 22* 05/03/2015   BUN 15 05/01/2015   CREATININE 1.26* 05/03/2015   PROT 6.6 06/24/2014   PROT 7.2 01/29/2014   ALBUMIN 3.1* 01/29/2014   BILITOT 0.4 06/24/2014   ALKPHOS 71 06/24/2014   AST 14 06/24/2014   ALT 8 06/24/2014  .   Total Time in preparing paper work, data evaluation and todays exam - 35  minutes  Leroy Sea M.D on 05/03/2015 at 10:28 AM  [facility]   Office  [phone]

## 2015-05-03 NOTE — Progress Notes (Signed)
Patient ID: Ryan [name], male   DOB: September 18, 1934, 80 y.o.   MRN: [id number]    Subjective:  Denies SSCP, palpitations or Dyspnea Wants to go home  Objective:  Filed Vitals:   05/02/15 1244 05/02/15 1432 05/02/15 2113 05/03/15 0500  BP: 121/60 111/69 148/80 133/66  Pulse: 65 65 63 64  Temp:  97.4 F (36.3 C) 97.8 F (36.6 C) 97.9 F (36.6 C)  TempSrc:  Oral Oral Oral  Resp:  20 18 18   Height:      Weight:    76.2 kg (167 lb 15.9 oz)  SpO2: 100% 99% 99% 100%    Intake/Output from previous day:  Intake/Output Summary (Last 24 hours) at 05/03/15 1017 Last data filed at 05/03/15 0818  Gross per 24 hour  Intake    960 ml  Output    300 ml  Net    660 ml    Physical Exam: Affect appropriate Healthy:  appears stated age HEENT: normal Neck supple with no adenopathy JVP normal no bruits no thyromegaly Lungs clear with no wheezing and good diaphragmatic motion Heart:  S1/S2 no murmur, no rub, gallop or click PMI normal Abdomen: benighn, BS positve, no tenderness, no AAA no bruit.  No HSM or HJR Distal pulses intact with no bruits No edema Neuro non-focal  Some dementia Skin warm and dry No muscular weakness   Lab Results: Basic Metabolic Panel:  Recent Labs  16/01/09 0336 05/03/15 0300  NA 139 138  K 4.9 4.1  CL 104 104  CO2 25 26  GLUCOSE 120* 115*  BUN 16 22*  CREATININE 1.06 1.26*  CALCIUM 9.2 9.1   CBC:  Recent Labs  05/01/15 1352  WBC 5.9  NEUTROABS 3.6  HGB 12.8*  HCT 38.4*  MCV 90.4  PLT 122*   Cardiac Enzymes:  Recent Labs  05/01/15 1930 05/02/15 0336 05/02/15 0850  TROPONINI 0.03 0.04* <0.03   BNP: Invalid input(s): POCBNP D-Dimer: No results for input(s): DDIMER in the last 72 hours. Hemoglobin A1C:  Recent Labs  05/01/15 1001  HGBA1C 6.7    Imaging: Dg Chest 2 View  05/01/2015   CLINICAL DATA:  Short of breath with exertion.  EXAM: CHEST  2 VIEW  COMPARISON:  01/29/2014.  FINDINGS: Emphysema is present.  Flattening of the hemidiaphragms. Cardiopericardial silhouette appears similar to prior exam, upper limits of normal for projection. Aortic arch atherosclerosis. There is pulmonary vascular congestion and interstitial pulmonary edema on today's exam. Small focus of opacity is present near the RIGHT costophrenic angle which may represent a small focus of alveolar edema. Bronchopneumonia can also produce this appearance.  IMPRESSION: Mild CHF with interstitial pulmonary edema superimposed on chronic emphysema. Small area of airspace opacity near the RIGHT costophrenic angle could represent alveolar edema or bronchopneumonia.   Electronically Signed   By: Ryan [name] M.D.   On: 05/01/2015 14:08    Cardiac Studies:  ECG:  Orders placed or performed during the hospital encounter of 05/01/15  . ED EKG  . ED EKG  . EKG     Telemetry:  NSR no arrhythmia 05/03/2015   Echo: Study Conclusions  - Left ventricle: Technically difficult study. There is hypokinesis of base inferior segment. Inferolateral wall difficult to assess. There is hypokinesis of this wall. The EF is 45-50% The cavity size was normal. Wall thickness was increased in a pattern of mild LVH. - Mitral valve: Calcified annulus. There was no significant regurgitation. - Right ventricle: The cavity size was  normal. Systolic function was normal.  Medications:   . aspirin EC  81 mg Oral QHS  . enoxaparin (LOVENOX) injection  40 mg Subcutaneous Q24H  . hydrALAZINE  25 mg Oral Q12H  . insulin aspart  0-5 Units Subcutaneous QHS  . insulin aspart  0-9 Units Subcutaneous TID WC  . isosorbide mononitrate  15 mg Oral Daily  . sodium chloride  3 mL Intravenous Q12H       Assessment/Plan:  Mr Ryan [name] has acute diastolic HF in the setting of poorly controlled HTN. Now improved with BP control and diuresis. Echo images reviewed . EF low normal with mild inferior HK. Based on echo and previous Myoview, likely has underlying  CAD but has not had any angina.   At this point would focus on treating his HTN and HF. Treat CAD medically. Ideally would treat with  ACE but he has had hyperkalemia.  Beta blocker not needed at this time as HR in 60 s  BP now controlled on hydralazine, lasix and nitrates  Have adjusted doses.   See DB;s note regarding social worker and safety at home  Outpatient f/u with [facility]  Ryan [name] 05/03/2015, 10:17 AM

## 2015-05-03 NOTE — Discharge Instructions (Signed)
Follow with Primary MD Bennie Pierini, FNP in 7 days   Get CBC, CMP, 2 view Chest X ray checked  by Primary MD next visit.    Activity: As tolerated with Full fall precautions use walker/cane & assistance as needed   Disposition Home     Diet: Heart Healthy   Check your Weight same time everyday, if you gain over 2 pounds, or you develop in leg swelling, experience more shortness of breath or chest pain, call your Primary MD immediately. Follow Cardiac Low Salt Diet and 1.5 lit/day fluid restriction.   On your next visit with your primary care physician please Get Medicines reviewed and adjusted.   Please request your Prim.MD to go over all Hospital Tests and Procedure/Radiological results at the follow up, please get all Hospital records sent to your Prim MD by signing hospital release before you go home.   If you experience worsening of your admission symptoms, develop shortness of breath, life threatening emergency, suicidal or homicidal thoughts you must seek medical attention immediately by calling 911 or calling your MD immediately  if symptoms less severe.  You Must read complete instructions/literature along with all the possible adverse reactions/side effects for all the Medicines you take and that have been prescribed to you. Take any new Medicines after you have completely understood and accpet all the possible adverse reactions/side effects.   Do not drive, operating heavy machinery, perform activities at heights, swimming or participation in water activities or provide baby sitting services if your were admitted for syncope or siezures until you have seen by Primary MD or a Neurologist and advised to do so again.  Do not drive when taking Pain medications.    Do not take more than prescribed Pain, Sleep and Anxiety Medications  Special Instructions: If you have smoked or chewed Tobacco  in the last 2 yrs please stop smoking, stop any regular Alcohol  and or any  Recreational drug use.  Wear Seat belts while driving.   Please note  You were cared for by a hospitalist during your hospital stay. If you have any questions about your discharge medications or the care you received while you were in the hospital after you are discharged, you can call the unit and asked to speak with the hospitalist on call if the hospitalist that took care of you is not available. Once you are discharged, your primary care physician will handle any further medical issues. Please note that NO REFILLS for any discharge medications will be authorized once you are discharged, as it is imperative that you return to your primary care physician (or establish a relationship with a primary care physician if you do not have one) for your aftercare needs so that they can reassess your need for medications and monitor your lab values.

## 2015-05-03 NOTE — Progress Notes (Signed)
Physical Therapy Treatment Patient Details Name: Ryan [name] MRN: [id number] DOB: Apr 15, 1935 Today's Date: 05/03/2015    History of Present Illness 80 yo male with onset of SOB and pulmonary edema has been admitted and has elevated troponin, hypertensive urgency.  Going home alone    PT Comments    Patient making good gains with mobility and gait.  Able to negotiate flight of stairs today.  Ready to d/c from PT perspective.  Follow Up Recommendations  Home health PT     Equipment Recommendations  None recommended by PT    Recommendations for Other Services       Precautions / Restrictions Precautions Precautions: Fall Restrictions Weight Bearing Restrictions: No    Mobility  Bed Mobility Overal bed mobility: Modified Independent                Transfers Overall transfer level: Modified independent Equipment used: Straight cane Transfers: Sit to/from Stand Sit to Stand: Modified independent (Device/Increase time)         General transfer comment: No cues or assist needed  Ambulation/Gait Ambulation/Gait assistance: Modified independent (Device/Increase time) Ambulation Distance (Feet): 300 Feet Assistive device: Straight cane Gait Pattern/deviations: Step-through pattern;Decreased stride length;Trunk flexed Gait velocity: reduced Gait velocity interpretation: Below normal speed for age/gender General Gait Details: Patient demonstrates safe use of cane during gait.  Improved balance with gait.  No physical assist needed.   Stairs Stairs: Yes Stairs assistance: Modified independent (Device/Increase time) Stair Management: One rail Left;Alternating pattern;Forwards;With cane Number of Stairs: 12 General stair comments: Patient able to complete flight of stairs with cane and 1 rail with no assist.  Wheelchair Mobility    Modified Rankin (Stroke Patients Only)       Balance                                    Cognition  Arousal/Alertness: Awake/alert Behavior During Therapy: WFL for tasks assessed/performed (Labile) Overall Cognitive Status: Within Functional Limits for tasks assessed                      Exercises      General Comments        Pertinent Vitals/Pain Pain Assessment: No/denies pain    Home Living                      Prior Function            PT Goals (current goals can now be found in the care plan section) Progress towards PT goals: Progressing toward goals    Frequency  Min 3X/week    PT Plan Current plan remains appropriate    Co-evaluation             End of Session   Activity Tolerance: Patient tolerated treatment well Patient left: in bed;with call bell/phone within reach (sitting EOB)     Time: 8757-9728 PT Time Calculation (min) (ACUTE ONLY): 15 min  Charges:  $Gait Training: 8-22 mins                    G Codes:  Functional Assessment Tool Used: Clinical judgement Functional Limitation: Mobility: Walking and moving around Mobility: Walking and Moving Around Goal Status [phone]): At least 1 percent but less than 20 percent impaired, limited or restricted Mobility: Walking and Moving Around Discharge Status [phone]): At least 1 percent but less than  20 percent impaired, limited or restricted   Vena Austria 05/03/2015, 11:18 AM Durenda Hurt. Renaldo Fiddler, [facility] Acute Rehab Services Pager [phone]

## 2015-05-05 ENCOUNTER — Encounter: Payer: Self-pay | Admitting: Family Medicine

## 2015-05-12 ENCOUNTER — Ambulatory Visit (INDEPENDENT_AMBULATORY_CARE_PROVIDER_SITE_OTHER): Payer: Medicare Other | Admitting: Family Medicine

## 2015-05-12 ENCOUNTER — Encounter: Payer: Self-pay | Admitting: Family Medicine

## 2015-05-12 VITALS — BP 133/64 | HR 60 | Temp 97.4°F | Ht 75.0 in | Wt 177.6 lb

## 2015-05-12 DIAGNOSIS — I2510 Atherosclerotic heart disease of native coronary artery without angina pectoris: Secondary | ICD-10-CM

## 2015-05-12 DIAGNOSIS — Z741 Need for assistance with personal care: Secondary | ICD-10-CM

## 2015-05-12 DIAGNOSIS — I5031 Acute diastolic (congestive) heart failure: Secondary | ICD-10-CM | POA: Diagnosis not present

## 2015-05-12 MED ORDER — ATORVASTATIN CALCIUM 40 MG PO TABS: 40.0000 mg | ORAL_TABLET | Freq: Every day | ORAL | Status: DC

## 2015-05-12 MED ORDER — FUROSEMIDE 20 MG PO TABS: 20.0000 mg | ORAL_TABLET | ORAL | Status: DC | PRN

## 2015-05-12 NOTE — Assessment & Plan Note (Signed)
Patient in hospital was ruled out of having an acute coronary event but it did appear that he did have a prior one. Will switch from red yeast rice extract to a statin. He goes to see cardiology tomorrow and will not make any further changes until they see him at that point.

## 2015-05-12 NOTE — Progress Notes (Addendum)
BP 133/64 mmHg  Pulse 60  Temp(Src) 97.4 F (36.3 C)  Ht 6\' 3"  (1.905 m)  Wt 177 lb 9.6 oz (80.559 kg)  BMI 22.20 kg/m2   Subjective:    Patient ID: Ryan [name], male    DOB: 1934-10-28, 80 y.o.   MRN: [id number]  HPI: Ryan [name] is a 80 y.o. male presenting on 05/12/2015 for Hospital Followup   HPI Hospital visit follow-up for shortness of breath Patient presents here today for a hospital follow-up after being sent to the hospital on her last visit for exertional shortness of breath and EKG changes that were concerning. In the hospitalization they did an EKG and trend the troponins, the EKG showed no changes from ours in the office in the troponins came back borderline within normal limits. He is not found to be in active coronary artery disease at the time. He denied chest pain throughout. They did perform an echocardiogram which showed an ejection fraction of 40-50% and congestive heart failure class I. They also found some regional wall abnormality such as lack of movement in certain areas consistent with the possibility of a previous infarct in the past. They also gave him Lasix, hydralazine and Imdur and he feels that his shortness of breath is significantly improved. He feels that he is able to walk around the stairs a lot better than he has done some time. He also feels that his swelling in his legs is much improved. He goes to see cardiology tomorrow in [place].  Difficulty taking care of himself Patient has difficulty taking care of himself and specifically taking his meds on time. When asked about his medications today it was very obvious that he was confusing multiple medications which was concerning. He may need somebody to come and help him at least labs medications and take them appropriately.  Relevant past medical, surgical, family and social history reviewed and updated as indicated. Interim medical history since our last visit reviewed. Allergies and  medications reviewed and updated.  Review of Systems  Constitutional: Negative for fever and chills.  HENT: Negative for ear discharge and ear pain.   Eyes: Negative for discharge and visual disturbance.  Respiratory: Negative for chest tightness, shortness of breath and wheezing.   Cardiovascular: Negative for chest pain, palpitations and leg swelling.  Gastrointestinal: Negative for abdominal pain, diarrhea and constipation.  Genitourinary: Negative for difficulty urinating.  Musculoskeletal: Negative for back pain and gait problem.  Skin: Negative for rash.  Neurological: Negative for dizziness, syncope, weakness, light-headedness, numbness and headaches.  Psychiatric/Behavioral: Positive for confusion (sometimes forgetsif he takes medications or not).  All other systems reviewed and are negative.   Per HPI unless specifically indicated above     Medication List       This list is accurate as of: 05/12/15  9:03 AM.  Always use your most recent med list.               aspirin EC 81 MG tablet  Take 81 mg by mouth at bedtime.     atorvastatin 40 MG tablet  Commonly known as:  LIPITOR  Take 1 tablet (40 mg total) by mouth daily.     cyanocobalamin 500 MCG tablet  Take 500 mcg by mouth daily.     furosemide 20 MG tablet  Commonly known as:  LASIX  Take 1 tablet (20 mg total) by mouth as needed for fluid (Shortness of breath).     Garlic 1000 MG Caps  Take 3 capsules by mouth every morning.     hydrALAZINE 50 MG tablet  Commonly known as:  APRESOLINE  Take 1 tablet (50 mg total) by mouth every 12 (twelve) hours.     isosorbide mononitrate 30 MG 24 hr tablet  Commonly known as:  IMDUR  Take 0.5 tablets (15 mg total) by mouth daily.           Objective:    BP 133/64 mmHg  Pulse 60  Temp(Src) 97.4 F (36.3 C)  Ht 6\' 3"  (1.905 m)  Wt 177 lb 9.6 oz (80.559 kg)  BMI 22.20 kg/m2  Wt Readings from Last 3 Encounters:  05/12/15 177 lb 9.6 oz (80.559 kg)    05/03/15 167 lb 15.9 oz (76.2 kg)  05/01/15 180 lb (81.647 kg)    Physical Exam  Constitutional: He is oriented to person, place, and time. He appears well-developed and well-nourished. No distress.  Eyes: Conjunctivae and EOM are normal. Pupils are equal, round, and reactive to light. Right eye exhibits no discharge. No scleral icterus.  Cardiovascular: Normal rate, regular rhythm, normal heart sounds and intact distal pulses.   No murmur heard. Pulmonary/Chest: Effort normal and breath sounds normal. No respiratory distress. He has no wheezes.  Abdominal: He exhibits no distension.  Musculoskeletal: Normal range of motion. He exhibits no edema.  Neurological: He is alert and oriented to person, place, and time. Coordination normal.  Skin: Skin is warm and dry. No rash noted. He is not diaphoretic.  Psychiatric: He has a normal mood and affect. His behavior is normal. Judgment and thought content normal. He exhibits abnormal recent memory (occasionally forgets about which medications are new or which medications he is taking).  Vitals reviewed.  MMSE: 27  Results for orders placed or performed during the hospital encounter of 05/01/15  Basic metabolic panel  Result Value Ref Range   Sodium 139 135 - 145 mmol/L   Potassium 4.3 3.5 - 5.1 mmol/L   Chloride 107 101 - 111 mmol/L   CO2 24 22 - 32 mmol/L   Glucose, Bld 118 (H) 65 - 99 mg/dL   BUN 13 6 - 20 mg/dL   Creatinine, Ser 9.67 0.61 - 1.24 mg/dL   Calcium 9.1 8.9 - 89.3 mg/dL   GFR calc non Af Amer >60 >60 mL/min   GFR calc Af Amer >60 >60 mL/min   Anion gap 8 5 - 15  CBC with Differential  Result Value Ref Range   WBC 5.9 4.0 - 10.5 K/uL   RBC 4.25 4.22 - 5.81 MIL/uL   Hemoglobin 12.8 (L) 13.0 - 17.0 g/dL   HCT 81.0 (L) 17.5 - 10.2 %   MCV 90.4 78.0 - 100.0 fL   MCH 30.1 26.0 - 34.0 pg   MCHC 33.3 30.0 - 36.0 g/dL   RDW 58.5 27.7 - 82.4 %   Platelets 122 (L) 150 - 400 K/uL   Neutrophils Relative % 60 43 - 77 %   Neutro  Abs 3.6 1.7 - 7.7 K/uL   Lymphocytes Relative 30 12 - 46 %   Lymphs Abs 1.8 0.7 - 4.0 K/uL   Monocytes Relative 6 3 - 12 %   Monocytes Absolute 0.4 0.1 - 1.0 K/uL   Eosinophils Relative 4 0 - 5 %   Eosinophils Absolute 0.2 0.0 - 0.7 K/uL   Basophils Relative 0 0 - 1 %   Basophils Absolute 0.0 0.0 - 0.1 K/uL  Brain natriuretic peptide  Result Value Ref Range  B Natriuretic Peptide 1031.0 (H) 0.0 - 100.0 pg/mL  Troponin I (q 6hr x 3)  Result Value Ref Range   Troponin I 0.03 <0.031 ng/mL  Troponin I (q 6hr x 3)  Result Value Ref Range   Troponin I 0.04 (H) <0.031 ng/mL  Basic metabolic panel  Result Value Ref Range   Sodium 139 135 - 145 mmol/L   Potassium 4.9 3.5 - 5.1 mmol/L   Chloride 104 101 - 111 mmol/L   CO2 25 22 - 32 mmol/L   Glucose, Bld 120 (H) 65 - 99 mg/dL   BUN 16 6 - 20 mg/dL   Creatinine, Ser 9.14 0.61 - 1.24 mg/dL   Calcium 9.2 8.9 - 78.2 mg/dL   GFR calc non Af Amer >60 >60 mL/min   GFR calc Af Amer >60 >60 mL/min   Anion gap 10 5 - 15  Troponin I (q 6hr x 3)  Result Value Ref Range   Troponin I <0.03 <0.031 ng/mL  Glucose, capillary  Result Value Ref Range   Glucose-Capillary 121 (H) 65 - 99 mg/dL   Comment 1 Notify RN    Comment 2 Document in Chart   Glucose, capillary  Result Value Ref Range   Glucose-Capillary 125 (H) 65 - 99 mg/dL  Glucose, capillary  Result Value Ref Range   Glucose-Capillary 142 (H) 65 - 99 mg/dL   Comment 1 Notify RN    Comment 2 Document in Chart   Glucose, capillary  Result Value Ref Range   Glucose-Capillary 91 65 - 99 mg/dL   Comment 1 Notify RN    Comment 2 Document in Chart   Basic metabolic panel  Result Value Ref Range   Sodium 138 135 - 145 mmol/L   Potassium 4.1 3.5 - 5.1 mmol/L   Chloride 104 101 - 111 mmol/L   CO2 26 22 - 32 mmol/L   Glucose, Bld 115 (H) 65 - 99 mg/dL   BUN 22 (H) 6 - 20 mg/dL   Creatinine, Ser 9.56 (H) 0.61 - 1.24 mg/dL   Calcium 9.1 8.9 - 21.3 mg/dL   GFR calc non Af Amer 53 (L) >60  mL/min   GFR calc Af Amer >60 >60 mL/min   Anion gap 8 5 - 15  Glucose, capillary  Result Value Ref Range   Glucose-Capillary 120 (H) 65 - 99 mg/dL  Glucose, capillary  Result Value Ref Range   Glucose-Capillary 119 (H) 65 - 99 mg/dL  Glucose, capillary  Result Value Ref Range   Glucose-Capillary 118 (H) 65 - 99 mg/dL   Comment 1 Document in Chart   I-stat troponin, ED  Result Value Ref Range   Troponin i, poc 0.00 0.00 - 0.08 ng/mL   Comment 3              Assessment & Plan:   Problem List Items Addressed This Visit      Cardiovascular and Mediastinum   Coronary atherosclerosis of native coronary artery - Primary    Patient in hospital was ruled out of having an acute coronary event but it did appear that he did have a prior one. Will switch from red yeast rice extract to a statin. He goes to see cardiology tomorrow and will not make any further changes until they see him at that point.      Acute diastolic heart failure, NYHA class 1    Patient's shortness of breath was found to be because of congestive heart failure, his ejection fraction  was 40-50% and was given Lasix as needed. He was also set up for an appointment with cardiology who he will see tomorrow. Refilled Lasix for as needed and we'll not make any changes further at this point until he sees cardiology. Likely could be on an ACE inhibitor but from hospitalization they were concerned about his kidney function, will await cardiology's recommendations and retest renal function in the future.       Other Visit Diagnoses    Need for assistance with personal care        Patient has difficulty taking care of himself and specifically taking his meds on time. He was confused about when and where to take his medications       Patient has forgetfulness and difficulty taking care of himself. It is obvious to [place] that he is needing some help with medication management to ensure that he is taking them on a daily basis and  properly without mixing or taking too many of any of his medications.  Follow up plan: Return in about 2 months (around 07/12/2015), or if symptoms worsen or fail to improve.  Arville Care, MD [facility] 05/12/2015, 9:03 AM

## 2015-05-12 NOTE — Patient Instructions (Signed)

## 2015-05-12 NOTE — Assessment & Plan Note (Signed)
Patient's shortness of breath was found to be because of congestive heart failure, his ejection fraction was 40-50% and was given Lasix as needed. He was also set up for an appointment with cardiology who he will see tomorrow. Refilled Lasix for as needed and we'll not make any changes further at this point until he sees cardiology. Likely could be on an ACE inhibitor but from hospitalization they were concerned about his kidney function, will await cardiology's recommendations and retest renal function in the future.

## 2015-05-13 ENCOUNTER — Ambulatory Visit (INDEPENDENT_AMBULATORY_CARE_PROVIDER_SITE_OTHER): Payer: Medicare Other | Admitting: Adult Health

## 2015-05-13 ENCOUNTER — Encounter: Payer: Self-pay | Admitting: Adult Health

## 2015-05-13 VITALS — BP 128/66 | HR 61 | Ht 75.0 in | Wt 176.0 lb

## 2015-05-13 DIAGNOSIS — I2510 Atherosclerotic heart disease of native coronary artery without angina pectoris: Secondary | ICD-10-CM

## 2015-05-13 DIAGNOSIS — R0602 Shortness of breath: Secondary | ICD-10-CM

## 2015-05-13 DIAGNOSIS — Z79899 Other long term (current) drug therapy: Secondary | ICD-10-CM | POA: Diagnosis not present

## 2015-05-13 DIAGNOSIS — I5022 Chronic systolic (congestive) heart failure: Secondary | ICD-10-CM

## 2015-05-13 NOTE — Patient Instructions (Addendum)
Your physician recommends that you schedule a follow-up appointment in: 1 month with Joni Reining, NP.  Your physician recommends that you return for lab work just before your next visit.  Your physician recommends that you continue on your current medications as directed. Please refer to the Current Medication list given to you today.  Your physician recommends that you weigh, daily, at the same time every day, and in the same amount of clothing. Please record your daily weights on the handout provided and bring it to your next appointment.  Thank you for choosing [facility]!  Low-Sodium Eating Plan Sodium raises blood pressure and causes water to be held in the body. Getting less sodium from food will help lower your blood pressure, reduce any swelling, and protect your heart, liver, and kidneys. We get sodium by adding salt (sodium chloride) to food. Most of our sodium comes from canned, boxed, and frozen foods. Restaurant foods, fast foods, and pizza are also very high in sodium. Even if you take medicine to lower your blood pressure or to reduce fluid in your body, getting less sodium from your food is important. WHAT IS MY PLAN? Most people should limit their sodium intake to 2,300 mg a day. Your health care provider recommends that you limit your sodium intake to __________ a day.  WHAT DO I NEED TO KNOW ABOUT THIS EATING PLAN? For the low-sodium eating plan, you will follow these general guidelines:  Choose foods with a % Daily Value for sodium of less than 5% (as listed on the food label).   Use salt-free seasonings or herbs instead of table salt or sea salt.   Check with your health care provider or pharmacist before using salt substitutes.   Eat fresh foods.  Eat more vegetables and fruits.  Limit canned vegetables. If you do use them, rinse them well to decrease the sodium.   Limit cheese to 1 oz (28 g) per day.   Eat lower-sodium products, often  labeled as "lower sodium" or "no salt added."  Avoid foods that contain monosodium glutamate (MSG). MSG is sometimes added to [place] food and some canned foods.  Check food labels (Nutrition Facts labels) on foods to learn how much sodium is in one serving.  Eat more home-cooked food and less restaurant, buffet, and fast food.  When eating at a restaurant, ask that your food be prepared with less salt or none, if possible.  HOW DO I READ FOOD LABELS FOR SODIUM INFORMATION? The Nutrition Facts label lists the amount of sodium in one serving of the food. If you eat more than one serving, you must multiply the listed amount of sodium by the number of servings. Food labels may also identify foods as:  Sodium free--Less than 5 mg in a serving.  Very low sodium--35 mg or less in a serving.  Low sodium--140 mg or less in a serving.  Light in sodium--50% less sodium in a serving. For example, if a food that usually has 300 mg of sodium is changed to become light in sodium, it will have 150 mg of sodium.  Reduced sodium--25% less sodium in a serving. For example, if a food that usually has 400 mg of sodium is changed to reduced sodium, it will have 300 mg of sodium. WHAT FOODS CAN I EAT? Grains Low-sodium cereals, including oats, puffed wheat and rice, and shredded wheat cereals. Low-sodium crackers. Unsalted rice and pasta. Lower-sodium bread.  Vegetables Frozen or fresh vegetables. Low-sodium or reduced-sodium  canned vegetables. Low-sodium or reduced-sodium tomato sauce and paste. Low-sodium or reduced-sodium tomato and vegetable juices.  Fruits Fresh, frozen, and canned fruit. Fruit juice.  Meat and Other Protein Products Low-sodium canned tuna and salmon. Fresh or frozen meat, poultry, seafood, and fish. Lamb. Unsalted nuts. Dried beans, peas, and lentils without added salt. Unsalted canned beans. Homemade soups without salt. Eggs.  Dairy Milk. Soy milk. Ricotta cheese.  Low-sodium or reduced-sodium cheeses. Yogurt.  Condiments Fresh and dried herbs and spices. Salt-free seasonings. Onion and garlic powders. Low-sodium varieties of mustard and ketchup. Lemon juice.  Fats and Oils Reduced-sodium salad dressings. Unsalted butter.  Other Unsalted popcorn and pretzels.  The items listed above may not be a complete list of recommended foods or beverages. Contact your dietitian for more options. WHAT FOODS ARE NOT RECOMMENDED? Grains Instant hot cereals. Bread stuffing, pancake, and biscuit mixes. Croutons. Seasoned rice or pasta mixes. Noodle soup cups. Boxed or frozen macaroni and cheese. Self-rising flour. Regular salted crackers. Vegetables Regular canned vegetables. Regular canned tomato sauce and paste. Regular tomato and vegetable juices. Frozen vegetables in sauces. Salted french fries. Olives. Rosita Fire. Relishes. Sauerkraut. Salsa. Meat and Other Protein Products Salted, canned, smoked, spiced, or pickled meats, seafood, or fish. Bacon, ham, sausage, hot dogs, corned beef, chipped beef, and packaged luncheon meats. Salt pork. Jerky. Pickled herring. Anchovies, regular canned tuna, and sardines. Salted nuts. Dairy Processed cheese and cheese spreads. Cheese curds. Blue cheese and cottage cheese. Buttermilk.  Condiments Onion and garlic salt, seasoned salt, table salt, and sea salt. Canned and packaged gravies. Worcestershire sauce. Tartar sauce. Barbecue sauce. Teriyaki sauce. Soy sauce, including reduced sodium. Steak sauce. Fish sauce. Oyster sauce. Cocktail sauce. Horseradish. Regular ketchup and mustard. Meat flavorings and tenderizers. Bouillon cubes. Hot sauce. Tabasco sauce. Marinades. Taco seasonings. Relishes. Fats and Oils Regular salad dressings. Salted butter. Margarine. Ghee. Bacon fat.  Other Potato and tortilla chips. Corn chips and puffs. Salted popcorn and pretzels. Canned or dried soups. Pizza. Frozen entrees and pot pies.  The  items listed above may not be a complete list of foods and beverages to avoid. Contact your dietitian for more information. Document Released: 02/19/2002 Document Revised: 09/04/2013 Document Reviewed: 07/04/2013 [facility] Patient Information 2015 [place], [place]. This information is not intended to replace advice given to you by your health care provider. Make sure you discuss any questions you have with your health care provider.

## 2015-05-13 NOTE — Progress Notes (Deleted)
Name: Ryan [name]    DOB: 1935-06-24  Age: 80 y.o.  MR#: [id number]       PCP:  Bennie Pierini, FNP      Insurance: Payor: MEDICARE / Plan: MEDICARE PART A AND B / Product Type: *No Product type* /   CC:   No chief complaint on file.   VS Filed Vitals:   05/13/15 1348  BP: 128/66  Pulse: 61  Height:  (1.905 m)  Weight: 176 lb (79.833 kg)  SpO2: 97%    Weights Current Weight  05/13/15 176 lb (79.833 kg)  05/12/15 177 lb 9.6 oz (80.559 kg)  05/03/15 167 lb 15.9 oz (76.2 kg)    Blood Pressure  BP Readings from Last 3 Encounters:  05/13/15 128/66  05/12/15 133/64  05/03/15 133/66     Admit date:  (Not on file) Last encounter with RMR:  Visit date not found   Allergy Lisinopril  Current Outpatient Prescriptions  Medication Sig Dispense Refill  . aspirin EC 81 MG tablet Take 81 mg by mouth at bedtime.     . furosemide (LASIX) 20 MG tablet Take 1 tablet (20 mg total) by mouth as needed for fluid (Shortness of breath). 30 tablet 0  . Garlic 1000 MG CAPS Take 3 capsules by mouth every morning.     . hydrALAZINE (APRESOLINE) 50 MG tablet Take 1 tablet (50 mg total) by mouth every 12 (twelve) hours. 60 tablet 0  . isosorbide mononitrate (IMDUR) 30 MG 24 hr tablet Take 0.5 tablets (15 mg total) by mouth daily. 30 tablet 0   Current Facility-Administered Medications  Medication Dose Route Frequency Provider Last Rate Last Dose  . metFORMIN (GLUCOPHAGE) tablet 500 mg  500 mg Oral BID WC Elige Radon Dettinger, MD        Discontinued Meds:    Medications Discontinued During This Encounter  Medication Reason  . cyanocobalamin 500 MCG tablet Error  . atorvastatin (LIPITOR) 40 MG tablet Error    Patient Active Problem List   Diagnosis Date Noted  . Diabetes type 2, controlled 05/01/2015  . Exertional dyspnea 05/01/2015  . Acute diastolic heart failure, NYHA class 1 05/01/2015  . Thrombocytopenia 05/01/2015  . Diarrhea 01/29/2014  . Coronary atherosclerosis of  native coronary artery 10/16/2012  . Dementia 10/16/2012  . EKG, abnormal 07/07/2012    LABS    Component Value Date/Time   NA 138 05/03/2015 0300   NA 139 05/02/2015 0336   NA 139 05/01/2015 1352   NA 143 05/01/2015 0948   NA 141 06/24/2014 1424   NA 141 02/13/2014 0838   K 4.1 05/03/2015 0300   K 4.9 05/02/2015 0336   K 4.3 05/01/2015 1352   CL 104 05/03/2015 0300   CL 104 05/02/2015 0336   CL 107 05/01/2015 1352   CO2 26 05/03/2015 0300   CO2 25 05/02/2015 0336   CO2 24 05/01/2015 1352   GLUCOSE 115* 05/03/2015 0300   GLUCOSE 120* 05/02/2015 0336   GLUCOSE 118* 05/01/2015 1352   GLUCOSE 126* 05/01/2015 0948   GLUCOSE 134* 06/24/2014 1424   GLUCOSE 146* 02/13/2014 0838   BUN 22* 05/03/2015 0300   BUN 16 05/02/2015 0336   BUN 13 05/01/2015 1352   BUN 15 05/01/2015 0948   BUN 18 06/24/2014 1424   BUN 20 02/13/2014 0838   CREATININE 1.26* 05/03/2015 0300   CREATININE 1.06 05/02/2015 0336   CREATININE 1.00 05/01/2015 1352   CALCIUM 9.1 05/03/2015 0300   CALCIUM 9.2  05/02/2015 0336   CALCIUM 9.1 05/01/2015 1352   GFRNONAA 53* 05/03/2015 0300   GFRNONAA >60 05/02/2015 0336   GFRNONAA >60 05/01/2015 1352   GFRAA >60 05/03/2015 0300   GFRAA >60 05/02/2015 0336   GFRAA >60 05/01/2015 1352   CMP     Component Value Date/Time   NA 138 05/03/2015 0300   NA 143 05/01/2015 0948   K 4.1 05/03/2015 0300   CL 104 05/03/2015 0300   CO2 26 05/03/2015 0300   GLUCOSE 115* 05/03/2015 0300   GLUCOSE 126* 05/01/2015 0948   BUN 22* 05/03/2015 0300   BUN 15 05/01/2015 0948   CREATININE 1.26* 05/03/2015 0300   CALCIUM 9.1 05/03/2015 0300   PROT 6.6 06/24/2014 1424   PROT 7.2 01/29/2014 1652   ALBUMIN 3.1* 01/29/2014 1652   AST 14 06/24/2014 1424   ALT 8 06/24/2014 1424   ALKPHOS 71 06/24/2014 1424   BILITOT 0.4 06/24/2014 1424   GFRNONAA 53* 05/03/2015 0300   GFRAA >60 05/03/2015 0300       Component Value Date/Time   WBC 5.9 05/01/2015 1352   WBC 7.6 02/01/2014  0635   WBC 5.5 01/31/2014 0548   HGB 12.8* 05/01/2015 1352   HGB 10.7* 02/01/2014 0635   HGB 11.5* 01/31/2014 0548   HCT 38.4* 05/01/2015 1352   HCT 31.6* 02/01/2014 0635   HCT 34.4* 01/31/2014 0548   MCV 90.4 05/01/2015 1352   MCV 89.3 02/01/2014 0635   MCV 91.2 01/31/2014 0548    Lipid Panel     Component Value Date/Time   CHOL 159 06/24/2014 1424   TRIG 210* 06/24/2014 1424   TRIG 111 07/08/2012 0551   HDL 45 06/24/2014 1424   HDL 50 07/08/2012 0551   CHOLHDL 3.0 07/08/2012 0551   VLDL 22 07/08/2012 0551   LDLCALC 72 06/24/2014 1424   LDLCALC 78 07/08/2012 0551    ABG No results found for: PHART, PCO2ART, PO2ART, HCO3, TCO2, ACIDBASEDEF, O2SAT   No results found for: TSH BNP (last 3 results)  Recent Labs  05/01/15 1352  BNP 1031.0*    ProBNP (last 3 results) No results for input(s): PROBNP in the last 8760 hours.  Cardiac Panel (last 3 results) No results for input(s): CKTOTAL, CKMB, TROPONINI, RELINDX in the last 72 hours.  Iron/TIBC/Ferritin/ %Sat No results found for: IRON, TIBC, FERRITIN, IRONPCTSAT   EKG Orders placed or performed in visit on 05/05/15  . EKG 12-Lead     Prior Assessment and Plan Problem List as of 05/13/2015      Cardiovascular and Mediastinum   Coronary atherosclerosis of native coronary artery   Last Assessment & Plan 05/12/2015 Office Visit Written 05/12/2015  9:13 AM by Elige Radon Dettinger, MD    Patient in hospital was ruled out of having an acute coronary event but it did appear that he did have a prior one. Will switch from red yeast rice extract to a statin. He goes to see cardiology tomorrow and will not make any further changes until they see him at that point.      Acute diastolic heart failure, NYHA class 1   Last Assessment & Plan 05/12/2015 Office Visit Written 05/12/2015  9:15 AM by Elige Radon Dettinger, MD    Patient's shortness of breath was found to be because of congestive heart failure, his ejection fraction was 40-50%  and was given Lasix as needed. He was also set up for an appointment with cardiology who he will see tomorrow. Refilled Lasix for  as needed and we'll not make any changes further at this point until he sees cardiology. Likely could be on an ACE inhibitor but from hospitalization they were concerned about his kidney function, will await cardiology's recommendations and retest renal function in the future.        Endocrine   Diabetes type 2, controlled   Last Assessment & Plan 05/01/2015 Office Visit Written 05/01/2015 11:04 AM by Elige Radon Dettinger, MD    Patient had an A1c that was 6.7 today showing that he has developed full-blown diabetes will discuss starting metformin. Will refer to ophthalmology, foot exam was performed today without any loss of feeling today.        Nervous and Auditory   Dementia   Last Assessment & Plan 10/16/2012 Office Visit Written 10/16/2012  9:29 AM by Jonelle Sidle, MD    Possible diagnosis, at least significant problems with memory seems to be the case. He is followed by Dr. Sherril Croon.        Other   EKG, abnormal   Diarrhea   Exertional dyspnea   Last Assessment & Plan 05/01/2015 Office Visit Edited 05/01/2015 11:56 AM by Elige Radon Dettinger, MD    Patient has increased shortness of breath while ambulating even going up and down a single flight of stairs is causing that now. History of smoking, hyperlipidemia, hypertension. We'll get an EKG and pulmonary function testing and also will get an chest x-ray. Because of EKG changes showing Q waves and new ST depression in V5 V6 along with this patient's symptoms of exertional dyspnea we are sending him to the hospital for ACS rule out.      Thrombocytopenia       Imaging: Dg Chest 2 View  05/01/2015   CLINICAL DATA:  Short of breath with exertion.  EXAM: CHEST  2 VIEW  COMPARISON:  01/29/2014.  FINDINGS: Emphysema is present. Flattening of the hemidiaphragms. Cardiopericardial silhouette appears similar to prior  exam, upper limits of normal for projection. Aortic arch atherosclerosis. There is pulmonary vascular congestion and interstitial pulmonary edema on today's exam. Small focus of opacity is present near the RIGHT costophrenic angle which may represent a small focus of alveolar edema. Bronchopneumonia can also produce this appearance.  IMPRESSION: Mild CHF with interstitial pulmonary edema superimposed on chronic emphysema. Small area of airspace opacity near the RIGHT costophrenic angle could represent alveolar edema or bronchopneumonia.   Electronically Signed   By: Andreas Newport M.D.   On: 05/01/2015 14:08

## 2015-05-13 NOTE — Progress Notes (Signed)
Cardiology Office Note   Date:  05/13/2015   ID:  Darral, Khosla 1935/06/25, MRN [id number]  PCP:  Bennie Pierini, FNP  Cardiologist:  [place]/ Joni Reining, NP   Chief Complaint  Patient presents with  . Coronary Artery Disease  . Congestive Heart Failure      History of Present Illness: Ryan [name] is a 80 y.o. male who presents for ongoing assessment and management of CAD, s/p hospitalization for acute on chronic systolic and diastolic CHF, NYHA Class 1, hypertensive urgency, with hx of thrombocytopenia, DM, and dementia. She was discharged on 10/02/2014 after diuresing 1200 cc. EKG demonstrated chronic ST segment depression in leads V4 and V5. Echo was completed with EF of 45%. He was sent home on lasix 20 mg daily.   He was treated with hydralazine and imdur for hypertension. No BB was prescribed due to bradycardia. He has a history of medical non-adherence. He was afraid his medications would "kill him" and therefore did not take them.  He comes today feeling better but has gone back to eating salty foods, and using Sea Salt. He has gained 8 lbs since being seen last. He is uncertain of which medications he is taking and what they are for. He has not taken any lasix for several days as the bottle states to use for dyspnea.   Past Medical History  Diagnosis Date  . Essential hypertension, benign      Hospital admission 10/13  . Abnormal ECG   . Type 2 diabetes mellitus   . Coronary atherosclerosis of native coronary artery     Based on Myoview - managing medically  . Dementia     Possible    Past Surgical History  Procedure Laterality Date  . Hernia repair    . Prostate surgery    . Knee surgery    . Hip surgery       Current Outpatient Prescriptions  Medication Sig Dispense Refill  . aspirin EC 81 MG tablet Take 81 mg by mouth at bedtime.     . furosemide (LASIX) 20 MG tablet Take 1 tablet (20 mg total) by mouth as needed for fluid  (Shortness of breath). 30 tablet 0  . Garlic 1000 MG CAPS Take 3 capsules by mouth every morning.     . hydrALAZINE (APRESOLINE) 50 MG tablet Take 1 tablet (50 mg total) by mouth every 12 (twelve) hours. 60 tablet 0  . isosorbide mononitrate (IMDUR) 30 MG 24 hr tablet Take 0.5 tablets (15 mg total) by mouth daily. 30 tablet 0   Current Facility-Administered Medications  Medication Dose Route Frequency Provider Last Rate Last Dose  . metFORMIN (GLUCOPHAGE) tablet 500 mg  500 mg Oral BID WC Elige Radon Dettinger, MD        Allergies:   Lisinopril    Social History:  The patient  reports that he quit smoking about 48 years ago. His smoking use included Cigarettes. He has a 15 pack-year smoking history. He has never used smokeless tobacco. He reports that he does not drink alcohol or use illicit drugs.   Family History:  The patient's family history includes COPD in his father; Heart attack in his mother.    ROS: All other systems are reviewed and negative. Unless otherwise mentioned in H&P    PHYSICAL EXAM: VS:  BP 128/66 mmHg  Pulse 61  Ht 6\' 3"  (1.905 m)  Wt 176 lb (79.833 kg)  BMI 22.00 kg/m2  SpO2 97% , BMI Body  mass index is 22 kg/(m^2). GEN: Well nourished, well developed, in no acute distress HEENT: normal Neck: no JVD, carotid bruits, or masses Cardiac: RRR; no murmurs, rubs, or gallops,no edema  Respiratory:  clear to auscultation bilaterally, normal work of breathing GI: soft, nontender, nondistended, + BS MS: no deformity or atrophy Skin: warm and dry, no rash Neuro:  Strength and sensation are intact Psych: euthymic mood, full affect   Recent Labs: 06/24/2014: ALT 8 05/01/2015: B Natriuretic Peptide 1031.0*; Hemoglobin 12.8*; Platelets 122* 05/03/2015: BUN 22*; Creatinine, Ser 1.26*; Potassium 4.1; Sodium 138    Lipid Panel    Component Value Date/Time   CHOL 159 06/24/2014 1424   TRIG 210* 06/24/2014 1424   TRIG 111 07/08/2012 0551   HDL 45 06/24/2014 1424    HDL 50 07/08/2012 0551   CHOLHDL 3.0 07/08/2012 0551   VLDL 22 07/08/2012 0551   LDLCALC 72 06/24/2014 1424   LDLCALC 78 07/08/2012 0551      Wt Readings from Last 3 Encounters:  05/13/15 176 lb (79.833 kg)  05/12/15 177 lb 9.6 oz (80.559 kg)  05/03/15 167 lb 15.9 oz (76.2 kg)      Other studies Reviewed: Additional studies/ records that were reviewed today include: None Review of the above records demonstrates: N/A   ASSESSMENT AND PLAN:  1.Chronic Systolic CHF: His echo completed demonstrated EF of 45%^-50 %. He is not weighing or taking lasix regularly. His BP is controlled. t I am checking BMET. He is being referred to [facility] to assist with home management. He is to weigh daily and record.Stop eating salt. He is given literature on this along with verbal counseling.   2. Diabetes: Followed by Dr. Louanne Skye  3. CAD: Medical management. On ARB and ASA.   4. Hypertension: On hydralazine and nitrates. Will follow.     Current medicines are reviewed at length with the patient today.    Labs/ tests ordered today include: BMET No orders of the defined types were placed in this encounter.     Disposition:   FU with 1 month  Signed, Joni Reining, NP  05/13/2015 1:58 PM    [facility] [address] [place], [place] [address] Phone: [phone]; Fax: [phone]

## 2015-05-28 DIAGNOSIS — H5203 Hypermetropia, bilateral: Secondary | ICD-10-CM | POA: Diagnosis not present

## 2015-05-28 DIAGNOSIS — H2513 Age-related nuclear cataract, bilateral: Secondary | ICD-10-CM | POA: Diagnosis not present

## 2015-05-28 DIAGNOSIS — H35033 Hypertensive retinopathy, bilateral: Secondary | ICD-10-CM | POA: Diagnosis not present

## 2015-05-28 DIAGNOSIS — H524 Presbyopia: Secondary | ICD-10-CM | POA: Diagnosis not present

## 2015-05-28 DIAGNOSIS — H5000 Unspecified esotropia: Secondary | ICD-10-CM | POA: Diagnosis not present

## 2015-05-28 DIAGNOSIS — H532 Diplopia: Secondary | ICD-10-CM | POA: Diagnosis not present

## 2015-05-28 DIAGNOSIS — H43813 Vitreous degeneration, bilateral: Secondary | ICD-10-CM | POA: Diagnosis not present

## 2015-06-05 ENCOUNTER — Telehealth: Payer: Self-pay | Admitting: *Deleted

## 2015-06-05 NOTE — Telephone Encounter (Signed)
[facility] could not see pt, due to his insurance. I can get home health, but i need it to be in you notes that he needs it and why. I also need an order. If you dont want to make an addendum, we will have to reschedule him

## 2015-06-06 ENCOUNTER — Other Ambulatory Visit: Payer: Self-pay | Admitting: Adult Health

## 2015-06-06 DIAGNOSIS — I5022 Chronic systolic (congestive) heart failure: Secondary | ICD-10-CM | POA: Diagnosis not present

## 2015-06-06 LAB — BASIC METABOLIC PANEL
BUN: 22 mg/dL (ref 7–25)
CO2: 26 mmol/L (ref 20–31)
Calcium: 9.2 mg/dL (ref 8.6–10.3)
Chloride: 104 mmol/L (ref 98–110)
Creat: 1.07 mg/dL (ref 0.70–1.18)
Glucose, Bld: 116 mg/dL — ABNORMAL HIGH (ref 65–99)
POTASSIUM: 3.9 mmol/L (ref 3.5–5.3)
SODIUM: 140 mmol/L (ref 135–146)

## 2015-06-06 NOTE — Telephone Encounter (Signed)
Did an addendum for him. Arville Care, MD [facility] 06/06/2015, 1:24 PM

## 2015-06-06 NOTE — Telephone Encounter (Signed)
Almira Coaster, see Dr. Darrol Poke note

## 2015-06-09 ENCOUNTER — Ambulatory Visit (INDEPENDENT_AMBULATORY_CARE_PROVIDER_SITE_OTHER): Payer: Medicare Other | Admitting: Adult Health

## 2015-06-09 VITALS — BP 140/90 | HR 58

## 2015-06-09 DIAGNOSIS — I2510 Atherosclerotic heart disease of native coronary artery without angina pectoris: Secondary | ICD-10-CM

## 2015-06-09 DIAGNOSIS — I5032 Chronic diastolic (congestive) heart failure: Secondary | ICD-10-CM | POA: Diagnosis not present

## 2015-06-09 MED ORDER — ISOSORBIDE MONONITRATE ER 30 MG PO TB24: 15.0000 mg | ORAL_TABLET | Freq: Every day | ORAL | Status: DC

## 2015-06-09 MED ORDER — HYDRALAZINE HCL 50 MG PO TABS: 50.0000 mg | ORAL_TABLET | Freq: Two times a day (BID) | ORAL | Status: DC

## 2015-06-09 MED ORDER — POTASSIUM CHLORIDE ER 10 MEQ PO TBCR: 10.0000 meq | EXTENDED_RELEASE_TABLET | Freq: Every day | ORAL | Status: DC

## 2015-06-09 MED ORDER — FUROSEMIDE 20 MG PO TABS: 20.0000 mg | ORAL_TABLET | Freq: Every day | ORAL | Status: DC

## 2015-06-09 NOTE — Patient Instructions (Signed)
Your physician wants you to follow-up in: 6 Months with Joni Reining, NP. You will receive a reminder letter in the mail two months in advance. If you don't receive a letter, please call our office to schedule the follow-up appointment.  Your physician recommends that you continue on your current medications as directed. Please refer to the Current Medication list given to you today.  Thank you for choosing [facility]!

## 2015-06-09 NOTE — Progress Notes (Deleted)
Name: Ryan [name]    DOB: Aug 31, 1935  Age: 80 y.o.  MR#: [id number]       PCP:  Bennie Pierini, FNP      Insurance: Payor: MEDICARE / Plan: MEDICARE PART A AND B / Product Type: *No Product type* /   CC:   No chief complaint on file.   VS Filed Vitals:   06/09/15 1525  BP: 140/90  Pulse: 58  SpO2: 97%    Weights Current Weight  05/13/15 176 lb (79.833 kg)  05/12/15 177 lb 9.6 oz (80.559 kg)  05/03/15 167 lb 15.9 oz (76.2 kg)    Blood Pressure  BP Readings from Last 3 Encounters:  06/09/15 140/90  05/13/15 128/66  05/12/15 133/64     Admit date:  (Not on file) Last encounter with RMR:  05/13/2015   Allergy Lisinopril  Current Outpatient Prescriptions  Medication Sig Dispense Refill  . aspirin EC 81 MG tablet Take 81 mg by mouth at bedtime.     . furosemide (LASIX) 20 MG tablet Take 1 tablet (20 mg total) by mouth as needed for fluid (Shortness of breath). 30 tablet 0  . Garlic 1000 MG CAPS Take 3 capsules by mouth every morning.     . hydrALAZINE (APRESOLINE) 50 MG tablet Take 1 tablet (50 mg total) by mouth every 12 (twelve) hours. 60 tablet 0  . isosorbide mononitrate (IMDUR) 30 MG 24 hr tablet Take 0.5 tablets (15 mg total) by mouth daily. 30 tablet 0   Current Facility-Administered Medications  Medication Dose Route Frequency Provider Last Rate Last Dose  . metFORMIN (GLUCOPHAGE) tablet 500 mg  500 mg Oral BID WC Elige Radon Dettinger, MD        Discontinued Meds:   There are no discontinued medications.  Patient Active Problem List   Diagnosis Date Noted  . Diabetes type 2, controlled 05/01/2015  . Exertional dyspnea 05/01/2015  . Acute diastolic heart failure, NYHA class 1 05/01/2015  . Thrombocytopenia 05/01/2015  . Diarrhea 01/29/2014  . Coronary atherosclerosis of native coronary artery 10/16/2012  . Dementia 10/16/2012  . EKG, abnormal 07/07/2012    LABS    Component Value Date/Time   NA 140 06/06/2015 1028   NA 138 05/03/2015 0300   NA 139 05/02/2015 0336   NA 143 05/01/2015 0948   NA 141 06/24/2014 1424   NA 141 02/13/2014 0838   K 3.9 06/06/2015 1028   K 4.1 05/03/2015 0300   K 4.9 05/02/2015 0336   CL 104 06/06/2015 1028   CL 104 05/03/2015 0300   CL 104 05/02/2015 0336   CO2 26 06/06/2015 1028   CO2 26 05/03/2015 0300   CO2 25 05/02/2015 0336   GLUCOSE 116* 06/06/2015 1028   GLUCOSE 115* 05/03/2015 0300   GLUCOSE 120* 05/02/2015 0336   GLUCOSE 126* 05/01/2015 0948   GLUCOSE 134* 06/24/2014 1424   GLUCOSE 146* 02/13/2014 0838   BUN 22 06/06/2015 1028   BUN 22* 05/03/2015 0300   BUN 16 05/02/2015 0336   BUN 15 05/01/2015 0948   BUN 18 06/24/2014 1424   BUN 20 02/13/2014 0838   CREATININE 1.07 06/06/2015 1028   CREATININE 1.26* 05/03/2015 0300   CREATININE 1.06 05/02/2015 0336   CREATININE 1.00 05/01/2015 1352   CALCIUM 9.2 06/06/2015 1028   CALCIUM 9.1 05/03/2015 0300   CALCIUM 9.2 05/02/2015 0336   GFRNONAA 53* 05/03/2015 0300   GFRNONAA >60 05/02/2015 0336   GFRNONAA >60 05/01/2015 1352   GFRAA >60 05/03/2015  0300   GFRAA >60 05/02/2015 0336   GFRAA >60 05/01/2015 1352   CMP     Component Value Date/Time   NA 140 06/06/2015 1028   NA 143 05/01/2015 0948   K 3.9 06/06/2015 1028   CL 104 06/06/2015 1028   CO2 26 06/06/2015 1028   GLUCOSE 116* 06/06/2015 1028   GLUCOSE 126* 05/01/2015 0948   BUN 22 06/06/2015 1028   BUN 15 05/01/2015 0948   CREATININE 1.07 06/06/2015 1028   CREATININE 1.26* 05/03/2015 0300   CALCIUM 9.2 06/06/2015 1028   PROT 6.6 06/24/2014 1424   PROT 7.2 01/29/2014 1652   ALBUMIN 3.1* 01/29/2014 1652   AST 14 06/24/2014 1424   ALT 8 06/24/2014 1424   ALKPHOS 71 06/24/2014 1424   BILITOT 0.4 06/24/2014 1424   GFRNONAA 53* 05/03/2015 0300   GFRAA >60 05/03/2015 0300       Component Value Date/Time   WBC 5.9 05/01/2015 1352   WBC 7.6 02/01/2014 0635   WBC 5.5 01/31/2014 0548   HGB 12.8* 05/01/2015 1352   HGB 10.7* 02/01/2014 0635   HGB 11.5* 01/31/2014 0548    HCT 38.4* 05/01/2015 1352   HCT 31.6* 02/01/2014 0635   HCT 34.4* 01/31/2014 0548   MCV 90.4 05/01/2015 1352   MCV 89.3 02/01/2014 0635   MCV 91.2 01/31/2014 0548    Lipid Panel     Component Value Date/Time   CHOL 159 06/24/2014 1424   TRIG 210* 06/24/2014 1424   TRIG 111 07/08/2012 0551   HDL 45 06/24/2014 1424   HDL 50 07/08/2012 0551   CHOLHDL 3.0 07/08/2012 0551   VLDL 22 07/08/2012 0551   LDLCALC 72 06/24/2014 1424   LDLCALC 78 07/08/2012 0551    ABG No results found for: PHART, PCO2ART, PO2ART, HCO3, TCO2, ACIDBASEDEF, O2SAT   No results found for: TSH BNP (last 3 results)  Recent Labs  05/01/15 1352  BNP 1031.0*    ProBNP (last 3 results) No results for input(s): PROBNP in the last 8760 hours.  Cardiac Panel (last 3 results) No results for input(s): CKTOTAL, CKMB, TROPONINI, RELINDX in the last 72 hours.  Iron/TIBC/Ferritin/ %Sat No results found for: IRON, TIBC, FERRITIN, IRONPCTSAT   EKG Orders placed or performed in visit on 05/05/15  . EKG 12-Lead     Prior Assessment and Plan Problem List as of 06/09/2015      Cardiovascular and Mediastinum   Coronary atherosclerosis of native coronary artery   Last Assessment & Plan 05/12/2015 Office Visit Written 05/12/2015  9:13 AM by Elige Radon Dettinger, MD    Patient in hospital was ruled out of having an acute coronary event but it did appear that he did have a prior one. Will switch from red yeast rice extract to a statin. He goes to see cardiology tomorrow and will not make any further changes until they see him at that point.      Acute diastolic heart failure, NYHA class 1   Last Assessment & Plan 05/12/2015 Office Visit Written 05/12/2015  9:15 AM by Elige Radon Dettinger, MD    Patient's shortness of breath was found to be because of congestive heart failure, his ejection fraction was 40-50% and was given Lasix as needed. He was also set up for an appointment with cardiology who he will see tomorrow.  Refilled Lasix for as needed and we'll not make any changes further at this point until he sees cardiology. Likely could be on an ACE inhibitor but from  hospitalization they were concerned about his kidney function, will await cardiology's recommendations and retest renal function in the future.        Endocrine   Diabetes type 2, controlled   Last Assessment & Plan 05/01/2015 Office Visit Written 05/01/2015 11:04 AM by Elige Radon Dettinger, MD    Patient had an A1c that was 6.7 today showing that he has developed full-blown diabetes will discuss starting metformin. Will refer to ophthalmology, foot exam was performed today without any loss of feeling today.        Nervous and Auditory   Dementia   Last Assessment & Plan 10/16/2012 Office Visit Written 10/16/2012  9:29 AM by Jonelle Sidle, MD    Possible diagnosis, at least significant problems with memory seems to be the case. He is followed by Dr. Sherril Croon.        Other   EKG, abnormal   Diarrhea   Exertional dyspnea   Last Assessment & Plan 05/01/2015 Office Visit Edited 05/01/2015 11:56 AM by Elige Radon Dettinger, MD    Patient has increased shortness of breath while ambulating even going up and down a single flight of stairs is causing that now. History of smoking, hyperlipidemia, hypertension. We'll get an EKG and pulmonary function testing and also will get an chest x-ray. Because of EKG changes showing Q waves and new ST depression in V5 V6 along with this patient's symptoms of exertional dyspnea we are sending him to the hospital for ACS rule out.      Thrombocytopenia       Imaging: No results found.

## 2015-06-09 NOTE — Progress Notes (Signed)
Cardiology Office Note   Date:  06/09/2015   ID:  [place], Berland Oct 12, 1934, MRN [id number]  PCP:  Bennie Pierini, FNP  Cardiologist:  [place]/ Joni Reining, NP   No chief complaint on file.     History of Present Illness: Ryan [name] is a 80 y.o. male who presents for ongoing assessment and management of CAD, s/p hospitalization for acute on chronic systolic and diastolic CHF, NYHA Class 1, hypertensive urgency, with hx of thrombocytopenia, DM, and dementia. She was discharged on 10/02/2014 after diuresing 1200 cc. EKG demonstrated chronic ST segment depression in leads V4 and V5. Echo was completed with EF of 45%. He was sent home on lasix 20 mg daily.   On last visit.  The patient again 8 pounds and has gone back to eating salty foods to include using sea salt.He also not been taking any Lasix.at that office visit he was given counseling on low sodium diet and asked to take his Lasix as directed.  Followup labs were completed and he is here evaluate his response to medication.  Labs completed on 06/06/2015: Sodium 140, potassium 3.9, chloride 104, CO2 26, BUN 22, creatinine 1.07.  He comes today without complaint.  He is now taking his Lasix daily around lunchtime.  He states he is feeling better, and breathing better.  Past Medical History  Diagnosis Date  . Essential hypertension, benign      Hospital admission 10/13  . Abnormal ECG   . Type 2 diabetes mellitus   . Coronary atherosclerosis of native coronary artery     Based on Myoview - managing medically  . Dementia     Possible    Past Surgical History  Procedure Laterality Date  . Hernia repair    . Prostate surgery    . Knee surgery    . Hip surgery       Current Outpatient Prescriptions  Medication Sig Dispense Refill  . aspirin EC 81 MG tablet Take 81 mg by mouth at bedtime.     . furosemide (LASIX) 20 MG tablet Take 1 tablet (20 mg total) by mouth as needed for fluid (Shortness of  breath). 30 tablet 0  . Garlic 1000 MG CAPS Take 3 capsules by mouth every morning.     . hydrALAZINE (APRESOLINE) 50 MG tablet Take 1 tablet (50 mg total) by mouth every 12 (twelve) hours. 60 tablet 0  . isosorbide mononitrate (IMDUR) 30 MG 24 hr tablet Take 0.5 tablets (15 mg total) by mouth daily. 30 tablet 0   Current Facility-Administered Medications  Medication Dose Route Frequency Ryan [name] Last Rate Last Dose  . metFORMIN (GLUCOPHAGE) tablet 500 mg  500 mg Oral BID WC Elige Radon Dettinger, MD        Allergies:   Lisinopril    Social History:  The patient  reports that he quit smoking about 48 years ago. His smoking use included Cigarettes. He has a 15 pack-year smoking history. He has never used smokeless tobacco. He reports that he does not drink alcohol or use illicit drugs.   Family History:  The patient's family history includes COPD in his father; Heart attack in his mother.    ROS: All other systems are reviewed and negative. Unless otherwise mentioned in H&P    PHYSICAL EXAM: VS:  BP 140/90 mmHg  Pulse 58  SpO2 97% , BMI There is no weight on file to calculate BMI. GEN: Well nourished, well developed, in no acute distress HEENT: normal Neck:  no JVD, carotid bruits, or masses Cardiac: RRR; no murmurs, rubs, or gallops,no edema  Respiratory:  clear to auscultation bilaterally, normal work of breathing GI: soft, nontender, nondistended, + BS MS: no deformity or atrophy Skin: warm and dry, no rash Neuro:  Strength and sensation are intact Psych: euthymic mood, full affect  Recent Labs: 06/24/2014: ALT 8 05/01/2015: B Natriuretic Peptide 1031.0*; Hemoglobin 12.8*; Platelets 122* 06/06/2015: BUN 22; Creat 1.07; Potassium 3.9; Sodium 140    Lipid Panel    Component Value Date/Time   CHOL 159 06/24/2014 1424   TRIG 210* 06/24/2014 1424   TRIG 111 07/08/2012 0551   HDL 45 06/24/2014 1424   HDL 50 07/08/2012 0551   CHOLHDL 3.0 07/08/2012 0551   VLDL 22 07/08/2012  0551   LDLCALC 72 06/24/2014 1424   LDLCALC 78 07/08/2012 0551      Wt Readings from Last 3 Encounters:  05/13/15 176 lb (79.833 kg)  05/12/15 177 lb 9.6 oz (80.559 kg)  05/03/15 167 lb 15.9 oz (76.2 kg)      Other studies Reviewed: Additional studies/ records that were reviewed today include: None Review of the above records demonstrates: N/A   ASSESSMENT AND PLAN:  1. Chronic diastolic heart failure: the patient appears well compensated.  He is feeling better, and eating better, and doing his best to avoid salty foods.  He is taking Lasix as directed.  I reviewed his labs and he is within normal limits.  Creatinine is mildly elevated at 1.2.  I will continue him on his current medication regimen and see him again in 6 months unless he is symptomatic.  2. CAD: Patient had an abnormal but low risk Myoview in 2013,is being treated medically.he will continue on aspirin,and isosorbide.  No plan.  Cardiac testing at this time.  We will see him again in 6 months  Current medicines are reviewed at length with the patient today.    Labs/ tests ordered today include: none No orders of the defined types were placed in this encounter.     Disposition:   FU with 6 months  Signed, Joni Reining, NP  06/09/2015 3:37 PM    [facility] [address], [place], [place] [address] Phone: [phone]; Fax: [phone]  records.  He states that it was possible

## 2015-06-10 ENCOUNTER — Ambulatory Visit: Payer: Medicare Other | Admitting: Adult Health

## 2015-06-10 ENCOUNTER — Encounter: Payer: Self-pay | Admitting: Adult Health

## 2015-06-11 ENCOUNTER — Telehealth: Payer: Self-pay | Admitting: *Deleted

## 2015-06-11 NOTE — Telephone Encounter (Signed)
Sorry, I do not see the addendum for this pt, I need the order for home health for med management in notes.

## 2015-06-11 NOTE — Telephone Encounter (Signed)
Addended the 05/12/2015 visit. Let me know if this is sufficient Arville Care, MD [facility] 06/11/2015, 4:46 PM

## 2015-06-12 DIAGNOSIS — I2510 Atherosclerotic heart disease of native coronary artery without angina pectoris: Secondary | ICD-10-CM | POA: Diagnosis not present

## 2015-06-12 DIAGNOSIS — I5042 Chronic combined systolic (congestive) and diastolic (congestive) heart failure: Secondary | ICD-10-CM | POA: Diagnosis not present

## 2015-06-12 NOTE — Telephone Encounter (Signed)
Sent referral to [facility], they will see pt today

## 2015-06-16 DIAGNOSIS — I2510 Atherosclerotic heart disease of native coronary artery without angina pectoris: Secondary | ICD-10-CM | POA: Diagnosis not present

## 2015-06-16 DIAGNOSIS — I5042 Chronic combined systolic (congestive) and diastolic (congestive) heart failure: Secondary | ICD-10-CM | POA: Diagnosis not present

## 2015-06-18 DIAGNOSIS — I2510 Atherosclerotic heart disease of native coronary artery without angina pectoris: Secondary | ICD-10-CM | POA: Diagnosis not present

## 2015-06-18 DIAGNOSIS — I5042 Chronic combined systolic (congestive) and diastolic (congestive) heart failure: Secondary | ICD-10-CM | POA: Diagnosis not present

## 2015-06-20 DIAGNOSIS — I5042 Chronic combined systolic (congestive) and diastolic (congestive) heart failure: Secondary | ICD-10-CM | POA: Diagnosis not present

## 2015-06-20 DIAGNOSIS — I2510 Atherosclerotic heart disease of native coronary artery without angina pectoris: Secondary | ICD-10-CM | POA: Diagnosis not present

## 2015-06-24 DIAGNOSIS — I5042 Chronic combined systolic (congestive) and diastolic (congestive) heart failure: Secondary | ICD-10-CM | POA: Diagnosis not present

## 2015-06-24 DIAGNOSIS — I2510 Atherosclerotic heart disease of native coronary artery without angina pectoris: Secondary | ICD-10-CM | POA: Diagnosis not present

## 2015-06-26 DIAGNOSIS — I2510 Atherosclerotic heart disease of native coronary artery without angina pectoris: Secondary | ICD-10-CM | POA: Diagnosis not present

## 2015-06-26 DIAGNOSIS — I5042 Chronic combined systolic (congestive) and diastolic (congestive) heart failure: Secondary | ICD-10-CM | POA: Diagnosis not present

## 2015-07-03 DIAGNOSIS — I2510 Atherosclerotic heart disease of native coronary artery without angina pectoris: Secondary | ICD-10-CM | POA: Diagnosis not present

## 2015-07-03 DIAGNOSIS — I5042 Chronic combined systolic (congestive) and diastolic (congestive) heart failure: Secondary | ICD-10-CM | POA: Diagnosis not present

## 2015-07-08 DIAGNOSIS — I5042 Chronic combined systolic (congestive) and diastolic (congestive) heart failure: Secondary | ICD-10-CM | POA: Diagnosis not present

## 2015-07-08 DIAGNOSIS — I2510 Atherosclerotic heart disease of native coronary artery without angina pectoris: Secondary | ICD-10-CM | POA: Diagnosis not present

## 2015-07-10 DIAGNOSIS — I2510 Atherosclerotic heart disease of native coronary artery without angina pectoris: Secondary | ICD-10-CM | POA: Diagnosis not present

## 2015-07-10 DIAGNOSIS — I5042 Chronic combined systolic (congestive) and diastolic (congestive) heart failure: Secondary | ICD-10-CM | POA: Diagnosis not present

## 2015-07-11 ENCOUNTER — Ambulatory Visit (INDEPENDENT_AMBULATORY_CARE_PROVIDER_SITE_OTHER): Payer: Medicare Other | Admitting: Family Medicine

## 2015-07-11 ENCOUNTER — Telehealth: Payer: Self-pay | Admitting: Nurse Practitioner

## 2015-07-11 ENCOUNTER — Encounter: Payer: Self-pay | Admitting: Family Medicine

## 2015-07-11 VITALS — BP 115/71 | HR 63 | Temp 97.0°F | Ht 75.0 in | Wt 184.2 lb

## 2015-07-11 DIAGNOSIS — I2510 Atherosclerotic heart disease of native coronary artery without angina pectoris: Secondary | ICD-10-CM | POA: Diagnosis not present

## 2015-07-11 DIAGNOSIS — I5031 Acute diastolic (congestive) heart failure: Secondary | ICD-10-CM | POA: Diagnosis not present

## 2015-07-11 MED ORDER — ISOSORBIDE MONONITRATE ER 30 MG PO TB24: 15.0000 mg | ORAL_TABLET | Freq: Every day | ORAL | Status: DC

## 2015-07-11 NOTE — Progress Notes (Signed)
BP 115/71 mmHg  Pulse 63  Temp(Src) 97 F (36.1 C) (Oral)  Ht  (1.905 m)  Wt 184 lb 3.2 oz (83.553 kg)  BMI 23.02 kg/m2   Subjective:    Patient ID: Ryan [name], male    DOB: [birth year], 80 y.o.   MRN: [id number]  HPI: Ryan [name] is a 80 y.o. male presenting on 07/11/2015 for Hypertension and Hyperlipidemia   HPI Recheck on breathing issues and congestive heart failure Patient has been doing much better on his breathing is not having any shortness of breath. He has been following up with cardiology and they have been helping to monitor and change his medications accordingly. He needs a refill on his Imdur.  Relevant past medical, surgical, family and social history reviewed and updated as indicated. Interim medical history since our last visit reviewed. Allergies and medications reviewed and updated.  Review of Systems  Constitutional: Negative for fever.  HENT: Negative for ear discharge and ear pain.   Eyes: Negative for discharge and visual disturbance.  Respiratory: Negative for cough, chest tightness, shortness of breath and wheezing.   Cardiovascular: Negative for chest pain, palpitations and leg swelling.  Gastrointestinal: Negative for abdominal pain, diarrhea and constipation.  Genitourinary: Negative for difficulty urinating.  Musculoskeletal: Negative for back pain and gait problem.  Skin: Negative for rash.  Neurological: Negative for dizziness, syncope, light-headedness and headaches.  All other systems reviewed and are negative.   Per HPI unless specifically indicated above     Medication List       This list is accurate as of: 07/11/15  8:47 AM.  Always use your most recent med list.               aspirin EC 81 MG tablet  Take 81 mg by mouth at bedtime.     furosemide 20 MG tablet  Commonly known as:  LASIX  Take 1 tablet (20 mg total) by mouth daily. Please Take At [facility] Daily     Garlic 1000 MG Caps  Take 3 capsules by  mouth every morning.     hydrALAZINE 50 MG tablet  Commonly known as:  APRESOLINE  Take 1 tablet (50 mg total) by mouth every 12 (twelve) hours.     isosorbide mononitrate 30 MG 24 hr tablet  Commonly known as:  IMDUR  Take 0.5 tablets (15 mg total) by mouth daily.     metFORMIN 500 MG tablet  Commonly known as:  GLUCOPHAGE  Take by mouth daily with breakfast.     potassium chloride 10 MEQ tablet  Commonly known as:  K-DUR  Take 1 tablet (10 mEq total) by mouth daily.           Objective:    BP 115/71 mmHg  Pulse 63  Temp(Src) 97 F (36.1 C) (Oral)  Ht  (1.905 m)  Wt 184 lb 3.2 oz (83.553 kg)  BMI 23.02 kg/m2  Wt Readings from Last 3 Encounters:  07/11/15 184 lb 3.2 oz (83.553 kg)  05/13/15 176 lb (79.833 kg)  05/12/15 177 lb 9.6 oz (80.559 kg)    Physical Exam  Constitutional: He is oriented to person, place, and time. He appears well-developed and well-nourished. No distress.  Eyes: Conjunctivae and EOM are normal. Pupils are equal, round, and reactive to light. Right eye exhibits no discharge. No scleral icterus.  Neck: Neck supple. No thyromegaly present.  Cardiovascular: Normal rate, regular rhythm, normal heart sounds and intact distal pulses.  No murmur heard. Pulmonary/Chest: Effort normal and breath sounds normal. No respiratory distress. He has no wheezes.  Musculoskeletal: Normal range of motion. He exhibits no edema.  Lymphadenopathy:    He has no cervical adenopathy.  Neurological: He is alert and oriented to person, place, and time. Coordination normal.  Skin: Skin is warm and dry. No rash noted. He is not diaphoretic.  Psychiatric: He has a normal mood and affect. His behavior is normal.  Vitals reviewed.   Results for orders placed or performed in visit on 06/06/15  Basic metabolic panel  Result Value Ref Range   Sodium 140 135 - 146 mmol/L   Potassium 3.9 3.5 - 5.3 mmol/L   Chloride 104 98 - 110 mmol/L   CO2 26 20 - 31 mmol/L    Glucose, Bld 116 (H) 65 - 99 mg/dL   BUN 22 7 - 25 mg/dL   Creat 0.35 5.97 - 4.16 mg/dL   Calcium 9.2 8.6 - 38.4 mg/dL      Assessment & Plan:   Problem List Items Addressed This Visit      Cardiovascular and Mediastinum   Acute diastolic heart failure, NYHA class 1 (HCC) - Primary    CHF controlled and shortness of breath resolved. Return in 6 months or sooner if needed. Needs a refill on Imdur and will send.      Relevant Medications   isosorbide mononitrate (IMDUR) 30 MG 24 hr tablet       Follow up plan: No Follow-up on file.  Arville Care, MD [facility] 07/11/2015, 8:47 AM

## 2015-07-11 NOTE — Assessment & Plan Note (Signed)
CHF controlled and shortness of breath resolved. Return in 6 months or sooner if needed. Needs a refill on Imdur and will send.

## 2015-07-14 ENCOUNTER — Ambulatory Visit (INDEPENDENT_AMBULATORY_CARE_PROVIDER_SITE_OTHER): Payer: Medicare Other | Admitting: Family Medicine

## 2015-07-14 DIAGNOSIS — I10 Essential (primary) hypertension: Secondary | ICD-10-CM

## 2015-07-14 DIAGNOSIS — F0390 Unspecified dementia without behavioral disturbance: Secondary | ICD-10-CM

## 2015-07-14 DIAGNOSIS — I5042 Chronic combined systolic (congestive) and diastolic (congestive) heart failure: Secondary | ICD-10-CM | POA: Diagnosis not present

## 2015-07-14 DIAGNOSIS — I2510 Atherosclerotic heart disease of native coronary artery without angina pectoris: Secondary | ICD-10-CM | POA: Diagnosis not present

## 2015-08-05 ENCOUNTER — Encounter: Payer: Self-pay | Admitting: *Deleted

## 2015-09-19 DIAGNOSIS — H532 Diplopia: Secondary | ICD-10-CM | POA: Diagnosis not present

## 2015-09-19 DIAGNOSIS — H524 Presbyopia: Secondary | ICD-10-CM | POA: Diagnosis not present

## 2015-11-05 ENCOUNTER — Ambulatory Visit (INDEPENDENT_AMBULATORY_CARE_PROVIDER_SITE_OTHER): Payer: Medicare Other | Admitting: Family Medicine

## 2015-11-05 ENCOUNTER — Encounter: Payer: Self-pay | Admitting: Family Medicine

## 2015-11-05 VITALS — BP 120/68 | HR 66 | Temp 97.6°F | Ht 75.0 in | Wt 181.8 lb

## 2015-11-05 DIAGNOSIS — I5031 Acute diastolic (congestive) heart failure: Secondary | ICD-10-CM

## 2015-11-05 DIAGNOSIS — E119 Type 2 diabetes mellitus without complications: Secondary | ICD-10-CM

## 2015-11-05 DIAGNOSIS — R55 Syncope and collapse: Secondary | ICD-10-CM

## 2015-11-05 LAB — POCT GLYCOSYLATED HEMOGLOBIN (HGB A1C): Hemoglobin A1C: 6.3

## 2015-11-05 NOTE — Progress Notes (Signed)
BP 120/68 mmHg  Pulse 66  Temp(Src) 97.6 F (36.4 C) (Oral)  Ht '6\' 3"'$  (1.905 m)  Wt 181 lb 12.8 oz (82.464 kg)  BMI 22.72 kg/m2   Subjective:    Patient ID: Ryan [name], male    DOB: 11-26-1934, 81 y.o.   MRN: [id number]  HPI: Ryan [name] is a 81 y.o. male presenting on 11/05/2015 for When patient squats down he feels like he is going to pass o and Podiatry referral   HPI Near syncopal episodes Patient comes in today because he has been having occasional episodes of near syncope when he squats down to pick something up. This does not happen any other time when he is getting up from lying down or getting up from sitting position it's mainly when he squats down to pick something up. He denies any headaches or blurred vision. This is happened a few times over the past couple months. He has not had this previously. He had EKGs and cardiac workup a few months ago. His orthostatics were normal today.  Type 2 diabetes recheck Patient is currently on metformin 500 mg twice per day. He is due for an hemoglobin A1c rechecked today. He denies any issues with the medication. Patient denies headaches, blurred vision, chest pains, shortness of breath, or weakness. Denies any side effects from medication and is content with current medication.   Congestive heart failure Patient has grade 1 congestive heart failure and has been giving Lasix as needed for control and his fluid levels. His breathing has been perfect and his fluid has been well-controlled. He says he still making good urine and denies any issues with that.  Relevant past medical, surgical, family and social history reviewed and updated as indicated. Interim medical history since our last visit reviewed. Allergies and medications reviewed and updated.  Review of Systems  Constitutional: Negative for fever and chills.  HENT: Negative for congestion, ear discharge and ear pain.   Eyes: Negative for discharge and visual  disturbance.  Respiratory: Negative for chest tightness, shortness of breath and wheezing.   Cardiovascular: Negative for chest pain, palpitations and leg swelling.  Gastrointestinal: Negative for abdominal pain, diarrhea and constipation.  Genitourinary: Negative for difficulty urinating.  Musculoskeletal: Negative for back pain and gait problem.  Skin: Negative for rash.  Neurological: Positive for dizziness, weakness (chronic) and light-headedness. Negative for tremors, syncope, numbness and headaches.  All other systems reviewed and are negative.   Per HPI unless specifically indicated above     Medication List       This list is accurate as of: 11/05/15  3:49 PM.  Always use your most recent med list.               aspirin EC 81 MG tablet  Take 81 mg by mouth at bedtime.     furosemide 20 MG tablet  Commonly known as:  LASIX  Take 1 tablet (20 mg total) by mouth daily. Please Take At [facility] Daily     Garlic 7169 MG Caps  Take 3 capsules by mouth every morning.     Ginger 500 MG Caps  Take by mouth.     hydrALAZINE 50 MG tablet  Commonly known as:  APRESOLINE  Take 1 tablet (50 mg total) by mouth every 12 (twelve) hours.     isosorbide mononitrate 30 MG 24 hr tablet  Commonly known as:  IMDUR  Take 0.5 tablets (15 mg total) by mouth daily.  metFORMIN 500 MG tablet  Commonly known as:  GLUCOPHAGE  Take by mouth daily with breakfast.     potassium chloride 10 MEQ tablet  Commonly known as:  K-DUR  Take 1 tablet (10 mEq total) by mouth daily.     vitamin C 100 MG tablet  Take 100 mg by mouth daily.           Objective:    BP 120/68 mmHg  Pulse 66  Temp(Src) 97.6 F (36.4 C) (Oral)  Ht '6\' 3"'$  (1.905 m)  Wt 181 lb 12.8 oz (82.464 kg)  BMI 22.72 kg/m2  Wt Readings from Last 3 Encounters:  11/05/15 181 lb 12.8 oz (82.464 kg)  07/11/15 184 lb 3.2 oz (83.553 kg)  05/13/15 176 lb (79.833 kg)    Physical Exam  Constitutional: He is oriented to  person, place, and time. He appears well-developed and well-nourished. No distress.  Eyes: Conjunctivae and EOM are normal. Pupils are equal, round, and reactive to light. Right eye exhibits no discharge. No scleral icterus.  Cardiovascular: Normal rate, regular rhythm, normal heart sounds and intact distal pulses.   No murmur heard. Pulmonary/Chest: Effort normal and breath sounds normal. No respiratory distress. He has no wheezes.  Abdominal: He exhibits no distension.  Musculoskeletal: Normal range of motion. He exhibits no edema.  Neurological: He is alert and oriented to person, place, and time. He displays normal reflexes. No cranial nerve deficit or sensory deficit. He exhibits normal muscle tone. Coordination normal.  Skin: Skin is warm and dry. No rash noted. He is not diaphoretic.  Psychiatric: He has a normal mood and affect. His behavior is normal.  Nursing note and vitals reviewed.  Diabetic Foot Exam - Simple   Simple Foot Form  Diabetic Foot exam was performed with the following findings:  Yes 11/05/2015  4:00 PM  Visual Inspection  No deformities, no ulcerations, no other skin breakdown bilaterally:  Yes  Sensation Testing  Intact to touch and monofilament testing bilaterally:  Yes  Pulse Check  Posterior Tibialis and Dorsalis pulse intact bilaterally:  Yes  Comments      Results for orders placed or performed in visit on 73/53/29  Basic metabolic panel  Result Value Ref Range   Sodium 140 135 - 146 mmol/L   Potassium 3.9 3.5 - 5.3 mmol/L   Chloride 104 98 - 110 mmol/L   CO2 26 20 - 31 mmol/L   Glucose, Bld 116 (H) 65 - 99 mg/dL   BUN 22 7 - 25 mg/dL   Creat 1.07 0.70 - 1.18 mg/dL   Calcium 9.2 8.6 - 10.3 mg/dL      Assessment & Plan:   Problem List Items Addressed This Visit      Cardiovascular and Mediastinum   Acute diastolic heart failure, NYHA class 1 (HCC)   Relevant Orders   Lipid panel     Endocrine   Diabetes type 2, controlled ([place]) - Primary    Relevant Orders   CMP14+EGFR   POCT glycosylated hemoglobin (Hb A1C)   Microalbumin / creatinine urine ratio    Other Visit Diagnoses    Vasovagal near syncope        Avoid actions that will trigger vagal responses such as squatting        Follow up plan: Return in about 3 months (around 02/02/2016), or if symptoms worsen or fail to improve, for Recheck diabetes and respiratory function.  Counseling provided for all of the vaccine components Orders Placed This Encounter  Procedures  . CMP14+EGFR  . Lipid panel  . POCT glycosylated hemoglobin (Hb A1C)    Caryl Pina, MD [facility] 11/05/2015, 3:49 PM

## 2015-11-06 LAB — CMP14+EGFR
A/G RATIO: 1.3 (ref 1.1–2.5)
ALT: 13 IU/L (ref 0–44)
AST: 19 IU/L (ref 0–40)
Albumin: 3.9 g/dL (ref 3.5–4.8)
Alkaline Phosphatase: 73 IU/L (ref 39–117)
BUN/Creatinine Ratio: 17 (ref 10–22)
BUN: 19 mg/dL (ref 8–27)
Bilirubin Total: 0.6 mg/dL (ref 0.0–1.2)
CALCIUM: 9.2 mg/dL (ref 8.6–10.2)
CO2: 23 mmol/L (ref 18–29)
CREATININE: 1.12 mg/dL (ref 0.76–1.27)
Chloride: 101 mmol/L (ref 96–106)
GFR, EST AFRICAN AMERICAN: 72 mL/min/{1.73_m2} (ref >59)
GFR, EST NON AFRICAN AMERICAN: 63 mL/min/{1.73_m2} (ref >59)
GLOBULIN, TOTAL: 2.9 g/dL (ref 1.5–4.5)
Glucose: 114 mg/dL — ABNORMAL HIGH (ref 65–99)
Potassium: 4.2 mmol/L (ref 3.5–5.2)
SODIUM: 143 mmol/L (ref 134–144)
TOTAL PROTEIN: 6.8 g/dL (ref 6.0–8.5)

## 2015-11-06 LAB — LIPID PANEL
CHOL/HDL RATIO: 2.9 ratio (ref 0.0–5.0)
Cholesterol, Total: 124 mg/dL (ref 100–199)
HDL: 43 mg/dL (ref >39)
LDL CALC: 47 mg/dL (ref 0–99)
TRIGLYCERIDES: 170 mg/dL — AB (ref 0–149)
VLDL Cholesterol Cal: 34 mg/dL (ref 5–40)

## 2015-11-06 LAB — MICROALBUMIN / CREATININE URINE RATIO
CREATININE, UR: 165.6 mg/dL
MICROALB/CREAT RATIO: 9.4 mg/g creat (ref 0.0–30.0)
Microalbumin, Urine: 15.5 ug/mL

## 2015-11-11 ENCOUNTER — Telehealth: Payer: Self-pay | Admitting: *Deleted

## 2015-11-11 NOTE — Telephone Encounter (Signed)
Patient calls to explain his condition and ask for advice.  This was his third day of fever, nausea , vomiting, weakness.  He claims he fell on three occassions but was not injured.  He has improved with no fever or vomiting but still very weak. He can not drive to come to office due to weakness. He was advised to stay hydrated, eat bland food as tolerated and call EMS if condition worsens.  He will keep urinal near his chair so he won't have to walk to bathroom as much.  Advised to call our office to inform us of his condition and any other concerns or questions he may have.   Provider on night clinic was aware of call.

## 2015-12-19 ENCOUNTER — Other Ambulatory Visit: Payer: Self-pay | Admitting: Adult Health

## 2016-01-13 ENCOUNTER — Ambulatory Visit: Payer: Medicare Other | Admitting: Family Medicine

## 2016-01-16 ENCOUNTER — Ambulatory Visit (INDEPENDENT_AMBULATORY_CARE_PROVIDER_SITE_OTHER): Payer: Medicare Other | Admitting: Family Medicine

## 2016-01-16 ENCOUNTER — Encounter: Payer: Self-pay | Admitting: Family Medicine

## 2016-01-16 VITALS — BP 135/75 | HR 68 | Temp 96.9°F | Ht 75.0 in | Wt 179.8 lb

## 2016-01-16 DIAGNOSIS — E119 Type 2 diabetes mellitus without complications: Secondary | ICD-10-CM

## 2016-01-16 DIAGNOSIS — I10 Essential (primary) hypertension: Secondary | ICD-10-CM | POA: Insufficient documentation

## 2016-01-16 DIAGNOSIS — M533 Sacrococcygeal disorders, not elsewhere classified: Secondary | ICD-10-CM | POA: Diagnosis not present

## 2016-01-16 NOTE — Assessment & Plan Note (Signed)
Continue current medications. 

## 2016-01-16 NOTE — Progress Notes (Signed)
BP 135/75 mmHg  Pulse 68  Temp(Src) 96.9 F (36.1 C) (Oral)  Ht 6\' 3"  (1.905 m)  Wt 179 lb 12.8 oz (81.557 kg)  BMI 22.47 kg/m2   Subjective:    Patient ID: Ryan [name], male    DOB: 06-Apr-1935, 81 y.o.   MRN: [id number]  HPI: Ryan [name] is a 81 y.o. male presenting on 01/16/2016 for Hypertension; Diabetes; and Pain in tailbone   HPI Fall and coccyx pain Patient fell off of a step ladder backwards and landed on his tailbone and has been having pain in the center of his tailbone. This happened a couple days ago. He does have pain with getting up and sitting down. He denies any pain with weightbearing or just standing there. The pain that he feels is right in the center of his gluteal cleft. No overlying skin changes noted. He says he was up on a stepladder trying to clean the cobwebs out of the corners but will no longer do that because he has an extension on his vacuum now. He has had other falls and we have attempted to get home health care and physical therapy form and he got some but then it was only paper by insurance for short period of time.  Hypertension and diabetes recheck Patient is coming in for a hypertension and diabetes recheck as well. He says his blood sugars is been doing well. His last hemoglobin A1c was 3 months ago and it was 6.3 we told him to redo it it in 6 months. His blood pressure is 135/75. He still has some episodes where he gets lightheaded when he stands up quickly. We have just instructed him to be very cautious about that and to always get up slowly to make sure that he does not have any further falls. Patient denies headaches, blurred vision, chest pains, shortness of breath, or weakness. Denies any side effects from medication and is content with current medication.   Relevant past medical, surgical, family and social history reviewed and updated as indicated. Interim medical history since our last visit reviewed. Allergies and medications  reviewed and updated.  Review of Systems  Constitutional: Negative for fever.  HENT: Negative for ear discharge and ear pain.   Eyes: Negative for discharge and visual disturbance.  Respiratory: Negative for shortness of breath and wheezing.   Cardiovascular: Negative for chest pain and leg swelling.  Gastrointestinal: Negative for abdominal pain, diarrhea and constipation.  Genitourinary: Negative for difficulty urinating.  Musculoskeletal: Positive for myalgias and arthralgias. Negative for back pain and gait problem.  Skin: Negative for rash.  Neurological: Positive for dizziness. Negative for syncope, light-headedness and headaches.  All other systems reviewed and are negative.   Per HPI unless specifically indicated above     Medication List       This list is accurate as of: 01/16/16  9:08 AM.  Always use your most recent med list.               aspirin EC 81 MG tablet  Take 81 mg by mouth at bedtime.     furosemide 20 MG tablet  Commonly known as:  LASIX  TAKE ONE TABLET BY MOUTH ONCE DAILY AT NOON     Garlic 1000 MG Caps  Take 3 capsules by mouth every morning.     Ginger 500 MG Caps  Take by mouth.     hydrALAZINE 50 MG tablet  Commonly known as:  APRESOLINE  Take 1  tablet (50 mg total) by mouth every 12 (twelve) hours.     isosorbide mononitrate 30 MG 24 hr tablet  Commonly known as:  IMDUR  Take 0.5 tablets (15 mg total) by mouth daily.     metFORMIN 500 MG tablet  Commonly known as:  GLUCOPHAGE  Take by mouth daily with breakfast.     potassium chloride 10 MEQ tablet  Commonly known as:  K-DUR  Take 1 tablet (10 mEq total) by mouth daily.     vitamin C 100 MG tablet  Take 100 mg by mouth daily.           Objective:    BP 135/75 mmHg  Pulse 68  Temp(Src) 96.9 F (36.1 C) (Oral)  Ht  (1.905 m)  Wt 179 lb 12.8 oz (81.557 kg)  BMI 22.47 kg/m2  Wt Readings from Last 3 Encounters:  01/16/16 179 lb 12.8 oz (81.557 kg)  11/05/15 181  lb 12.8 oz (82.464 kg)  07/11/15 184 lb 3.2 oz (83.553 kg)    Physical Exam  Constitutional: He is oriented to person, place, and time. He appears well-developed and well-nourished. No distress.  Eyes: Conjunctivae and EOM are normal. Pupils are equal, round, and reactive to light. Right eye exhibits no discharge. No scleral icterus.  Neck: Neck supple. No thyromegaly present.  Cardiovascular: Normal rate, regular rhythm, normal heart sounds and intact distal pulses.   No murmur heard. Pulmonary/Chest: Effort normal and breath sounds normal. No respiratory distress. He has no wheezes.  Musculoskeletal: Normal range of motion. He exhibits no edema.       Lumbar back: He exhibits tenderness. He exhibits normal range of motion, no deformity and no laceration.       Back:  Lymphadenopathy:    He has no cervical adenopathy.  Neurological: He is alert and oriented to person, place, and time. Coordination normal.  Skin: Skin is warm and dry. No rash noted. He is not diaphoretic.  Psychiatric: He has a normal mood and affect. His behavior is normal.  Nursing note and vitals reviewed.      Assessment & Plan:   Problem List Items Addressed This Visit      Cardiovascular and Mediastinum   Essential hypertension, benign    Continue current medications        Endocrine   Diabetes type 2, controlled (HCC) - Primary    Continue current medications       Other Visit Diagnoses    Coccyx pain        Follow off stepladder, recommended not to do that, gets lightheaded sometimes, because of CHF, no dizziness or loss of consciousness      Recommended to stay off stepladder's or anything above the ground level.  Follow up plan: Return in about 3 months (around 04/17/2016), or if symptoms worsen or fail to improve, for Diabetes and hypertension recheck.  Counseling provided for all of the vaccine components No orders of the defined types were placed in this encounter.    Arville Care,  MD [facility] 01/16/2016, 9:08 AM

## 2016-01-26 ENCOUNTER — Other Ambulatory Visit: Payer: Self-pay | Admitting: Adult Health

## 2016-01-28 ENCOUNTER — Ambulatory Visit (INDEPENDENT_AMBULATORY_CARE_PROVIDER_SITE_OTHER): Payer: Medicare Other | Admitting: Cardiology

## 2016-01-28 ENCOUNTER — Encounter: Payer: Self-pay | Admitting: Cardiology

## 2016-01-28 VITALS — BP 118/62 | HR 61 | Ht 75.0 in | Wt 183.0 lb

## 2016-01-28 DIAGNOSIS — I5022 Chronic systolic (congestive) heart failure: Secondary | ICD-10-CM | POA: Diagnosis not present

## 2016-01-28 DIAGNOSIS — F0390 Unspecified dementia without behavioral disturbance: Secondary | ICD-10-CM

## 2016-01-28 DIAGNOSIS — I2510 Atherosclerotic heart disease of native coronary artery without angina pectoris: Secondary | ICD-10-CM | POA: Diagnosis not present

## 2016-01-28 DIAGNOSIS — I10 Essential (primary) hypertension: Secondary | ICD-10-CM

## 2016-01-28 NOTE — Patient Instructions (Signed)
Your physician recommends that you continue on your current medications as directed. Please refer to the Current Medication list given to you today. Your physician recommends that you schedule a follow-up appointment in: 6 months. You will receive a reminder letter in the mail in about 4 months reminding you to call and schedule your appointment. If you don't receive this letter, please contact our office. 

## 2016-01-28 NOTE — Progress Notes (Signed)
Cardiology Office Note  Date: 01/28/2016   ID: Adama, Ferber 05/10/1935, MRN [id number]  PCP: Nils Pyle, MD  Primary Cardiologist: Nona Dell, MD   Chief Complaint  Patient presents with  . Coronary Artery Disease    History of Present Illness: Ryan [name] is a 81 y.o. male that I have not seen in the office since 2014. Most recent follow-up with was with Ms. Lawrence NP in September 2016. He has a history of medically managed CAD based on previous abnormal Cardiolite, also cardiomyopathy with mild LV dysfunction.  He presents for a routine visit today. States that he has stable dyspnea on exertion, sometimes when he sings in the choir at church. He uses a cane to ambulate, denies any recent falls. Does have trouble with his memory and word choice.  I reviewed his medications which are outlined below. Cardiac regimen includes aspirin, Lipitor, Lasix, hydralazine, Imdur, and potassium supplements. His weight is up a few pounds.  Last echocardiogram was in August 2016 showing LVEF 45-50%.  Past Medical History  Diagnosis Date  . Essential hypertension, benign   . Abnormal ECG   . Type 2 diabetes mellitus (HCC)   . Coronary atherosclerosis of native coronary artery     Based on Myoview - managing medically  . Dementia   . Cardiomyopathy (HCC)     LVEF 45-50%    Past Surgical History  Procedure Laterality Date  . Hernia repair    . Prostate surgery    . Knee surgery    . Hip surgery      Current Outpatient Prescriptions  Medication Sig Dispense Refill  . Ascorbic Acid (VITAMIN C) 100 MG tablet Take 100 mg by mouth daily.    Marland Kitchen aspirin EC 81 MG tablet Take 81 mg by mouth at bedtime.     Marland Kitchen atorvastatin (LIPITOR) 40 MG tablet Take 40 mg by mouth daily.    . furosemide (LASIX) 20 MG tablet TAKE ONE TABLET BY MOUTH ONCE DAILY AT NOON 30 tablet 6  . Garlic 1000 MG CAPS Take 3 capsules by mouth every morning.     . Ginger 500 MG CAPS Take by  mouth.    . hydrALAZINE (APRESOLINE) 50 MG tablet TAKE ONE TABLET BY MOUTH EVERY 12 HOURS 60 tablet 11  . isosorbide mononitrate (IMDUR) 30 MG 24 hr tablet Take 0.5 tablets (15 mg total) by mouth daily. 30 tablet 6  . potassium chloride (K-DUR) 10 MEQ tablet Take 1 tablet (10 mEq total) by mouth daily. 90 tablet 3   No current facility-administered medications for this visit.   Allergies:  Lisinopril   Social History: The patient  reports that he quit smoking about 49 years ago. His smoking use included Cigarettes. He has a 15 pack-year smoking history. He has never used smokeless tobacco. He reports that he does not drink alcohol or use illicit drugs.   ROS:  Please see the history of present illness. Otherwise, complete review of systems is positive for chronic memory trouble.  All other systems are reviewed and negative.   Physical Exam: VS:  BP 118/62 mmHg  Pulse 61  Ht  (1.905 m)  Wt 183 lb (83.008 kg)  BMI 22.87 kg/m2  SpO2 99%, BMI Body mass index is 22.87 kg/(m^2).  Wt Readings from Last 3 Encounters:  01/28/16 183 lb (83.008 kg)  01/16/16 179 lb 12.8 oz (81.557 kg)  11/05/15 181 lb 12.8 oz (82.464 kg)    Elderly  male in no distress, using a cane. HEENT: Conjunctiva and lids normal, oropharynx clear.  Neck: Supple, no elevated JVP or carotid bruits, no thyromegaly.  Lungs: Clear to auscultation, nonlabored breathing at rest.  Cardiac: Regular rate and rhythm, no S3, soft systolic murmur at apex, no diastolic murmur, no pericardial rub.  Abdomen: Soft, nontender, bowel sounds present.  Extremities: No pitting edema, distal pulses 2+.  Skin: Warm and dry.  Musculoskeletal: No kyphosis.  Neuropsychiatric: Alert and oriented x3, affect grossly appropriate.  ECG: I personally reviewed the tracing from 05/01/2015 which showed sinus rhythm with inferior Q waves and diffuse nonspecific ST-T wave abnormalities.  Recent Labwork: 05/01/2015: B Natriuretic Peptide  1031.0*; Hemoglobin 12.8*; Platelets 122* 11/05/2015: ALT 13; AST 19; BUN 19; Creatinine, Ser 1.12; Potassium 4.2; Sodium 143     Component Value Date/Time   CHOL 124 11/05/2015 1600   CHOL 159 06/24/2014 1424   TRIG 170* 11/05/2015 1600   TRIG 210* 06/24/2014 1424   HDL 43 11/05/2015 1600   HDL 45 06/24/2014 1424   HDL 50 07/08/2012 0551   CHOLHDL 2.9 11/05/2015 1600   CHOLHDL 3.0 07/08/2012 0551   VLDL 22 07/08/2012 0551   LDLCALC 47 11/05/2015 1600   LDLCALC 72 06/24/2014 1424   LDLCALC 78 07/08/2012 0551   Other Studies Reviewed Today:  Lexiscan Cardiolite 08/21/2012: IMPRESSION:  Abnormal but relatively low risk Lexiscan Myoview as outlined. Baseline ECG shows ST-segment abnormalities, nondiagnostic accentuation noted with stress, no arrhythmias noted. Perfusion imaging is consistent with ischemia at the inferior apex, probable soft tissue attenuation affecting the anteroseptal apex. LVEF is calculated at 65%.  Echocardiogram 05/02/2015: Study Conclusions  - Left ventricle: Technically difficult study. There is hypokinesis  of base inferior segment. Inferolateral wall difficult to assess.  There is hypokinesis of this wall. The EF is 45-50% The cavity  size was normal. Wall thickness was increased in a pattern of  mild LVH. - Mitral valve: Calcified annulus. There was no significant  regurgitation. - Right ventricle: The cavity size was normal. Systolic function  was normal.  Assessment and Plan:  1. Medically managed CAD without angina symptoms, based on prior Cardiolite from 2013. No accelerating symptoms.  2. Cardiomyopathy with LVEF 45-50%, likely ischemic in etiology. Continue present regimen including low-dose Lasix. Follow daily weights. Sodium restriction discussed. He has an allergy to lisinopril. Not on beta blocker at this time with low resting heart rate around 60.  3. Essential hypertension, blood pressure is well controlled today.  4.  Dementia with memory deficits.  Current medicines were reviewed with the patient today.  Disposition: FU with me in 6 months.   Signed, Jonelle Sidle, MD, [facility] 01/28/2016 8:32 AM    [facility] at [facility] [address] [place], [place], [place] [address] Phone: [phone]; Fax: [phone]

## 2016-01-29 ENCOUNTER — Encounter: Payer: Self-pay | Admitting: Family

## 2016-01-29 ENCOUNTER — Ambulatory Visit (INDEPENDENT_AMBULATORY_CARE_PROVIDER_SITE_OTHER): Payer: Medicare Other | Admitting: Family

## 2016-01-29 ENCOUNTER — Ambulatory Visit (INDEPENDENT_AMBULATORY_CARE_PROVIDER_SITE_OTHER): Payer: Medicare Other

## 2016-01-29 ENCOUNTER — Telehealth: Payer: Self-pay | Admitting: *Deleted

## 2016-01-29 VITALS — BP 127/65 | HR 58 | Temp 97.8°F | Ht 75.0 in | Wt 180.0 lb

## 2016-01-29 DIAGNOSIS — I5031 Acute diastolic (congestive) heart failure: Secondary | ICD-10-CM | POA: Diagnosis not present

## 2016-01-29 DIAGNOSIS — F0390 Unspecified dementia without behavioral disturbance: Secondary | ICD-10-CM

## 2016-01-29 DIAGNOSIS — I2510 Atherosclerotic heart disease of native coronary artery without angina pectoris: Secondary | ICD-10-CM | POA: Diagnosis not present

## 2016-01-29 DIAGNOSIS — R42 Dizziness and giddiness: Secondary | ICD-10-CM

## 2016-01-29 DIAGNOSIS — R0609 Other forms of dyspnea: Secondary | ICD-10-CM | POA: Diagnosis not present

## 2016-01-29 DIAGNOSIS — R0602 Shortness of breath: Secondary | ICD-10-CM

## 2016-01-29 NOTE — Telephone Encounter (Signed)
Patient came in to the office complaining with SOB and was requesting an inhaler. Patient was brought into the triage office and I spoke with patient and advised him that he would have to be seen. I checked patients oxygen level and it was 99. Patient was scheduled in [place] scheduled.

## 2016-01-29 NOTE — Progress Notes (Signed)
   Subjective:    Patient ID: Ryan [name], male    DOB: Dec 30, 1934, 81 y.o.   MRN: [id number]  Pt presents to the office today with recurrent SOB. PT is a poor historian and has dementia. Pt states this has been going on for the last few years, but seem worse now. Per EPIC pt has just seen Cardiolgoists on 01/28/16 with stable CAD and cardiomyopathy with LVEF 45-50%.  Shortness of Breath This is a recurrent problem. The current episode started more than 1 year ago. The problem occurs constantly. The problem has been waxing and waning. Associated symptoms include leg swelling and sputum production. Pertinent negatives include no ear pain, fever, headaches or wheezing. He has tried rest for the symptoms. The treatment provided mild relief. There is no history of allergies.      Review of Systems  Constitutional: Negative for fever.  HENT: Negative for ear pain.   Respiratory: Positive for cough, sputum production and shortness of breath. Negative for wheezing.   Cardiovascular: Positive for leg swelling.  Neurological: Negative for headaches.  All other systems reviewed and are negative.      Objective:   Physical Exam  Constitutional: He is oriented to person, place, and time. He appears well-developed and well-nourished. No distress.  HENT:  Head: Normocephalic.  Right Ear: External ear normal.  Left Ear: External ear normal.  Mouth/Throat: Oropharynx is clear and moist.  Eyes: Pupils are equal, round, and reactive to light. Right eye exhibits no discharge. Left eye exhibits no discharge.  Neck: Normal range of motion. Neck supple. No thyromegaly present.  Cardiovascular: Normal rate, regular rhythm, normal heart sounds and intact distal pulses.   No murmur heard. Pulmonary/Chest: Effort normal and breath sounds normal. No respiratory distress. He has no wheezes.  Diminished breath sounds   Abdominal: Soft. Bowel sounds are normal. He exhibits no distension. There is no  tenderness.  Musculoskeletal: Normal range of motion. He exhibits no edema or tenderness.  Neurological: He is alert and oriented to person, place, and time.  Skin: Skin is warm and dry. No rash noted. No erythema.  Psychiatric: He has a normal mood and affect. His behavior is normal. Judgment and thought content normal.  Vitals reviewed.  Chest x-ray- CHF, no active infections, Preliminary reading by Evelina Dun, FNP [facility]     BP 127/65 mmHg  Pulse 58  Temp(Src) 97.8 F (36.6 C) (Oral)  Ht '6\' 3"'$  (1.905 m)  Wt 180 lb (81.647 kg)  BMI 22.50 kg/m2  SpO2 99%  Assessment & Plan:  1. Acute diastolic heart failure, NYHA class 1 ([place]) - DG Chest 2 View; Future - CMP14+EGFR  2. Dementia, without behavioral disturbance - CMP14+EGFR  3. Atherosclerosis of native coronary artery of native heart without angina pectoris - DG Chest 2 View; Future - CMP14+EGFR  4. SOB (shortness of breath) - DG Chest 2 View; Future - CMP14+EGFR - CBC with Differential/Platelet  5. Exertional dyspnea - CMP14+EGFR  6. Dizziness - CMP14+EGFR - CBC with Differential/Platelet   Continue all meds Labs pending Keep all appts with Cardiologists and PCP Continue lasix daily  Evelina Dun, FNP

## 2016-01-29 NOTE — Patient Instructions (Signed)

## 2016-01-30 ENCOUNTER — Other Ambulatory Visit: Payer: Self-pay | Admitting: Family

## 2016-01-30 ENCOUNTER — Telehealth: Payer: Self-pay | Admitting: Family

## 2016-01-30 LAB — CMP14+EGFR
A/G RATIO: 1.4 (ref 1.2–2.2)
ALBUMIN: 4 g/dL (ref 3.5–4.8)
ALT: 12 IU/L (ref 0–44)
AST: 15 IU/L (ref 0–40)
Alkaline Phosphatase: 92 IU/L (ref 39–117)
BUN / CREAT RATIO: 21 (ref 10–24)
BUN: 21 mg/dL (ref 8–27)
Bilirubin Total: 0.7 mg/dL (ref 0.0–1.2)
CALCIUM: 9.1 mg/dL (ref 8.6–10.2)
CO2: 22 mmol/L (ref 18–29)
CREATININE: 1.02 mg/dL (ref 0.76–1.27)
Chloride: 102 mmol/L (ref 96–106)
GFR, EST AFRICAN AMERICAN: 81 mL/min/{1.73_m2} (ref >59)
GFR, EST NON AFRICAN AMERICAN: 70 mL/min/{1.73_m2} (ref >59)
GLOBULIN, TOTAL: 2.8 g/dL (ref 1.5–4.5)
Glucose: 116 mg/dL — ABNORMAL HIGH (ref 65–99)
POTASSIUM: 4 mmol/L (ref 3.5–5.2)
SODIUM: 142 mmol/L (ref 134–144)
TOTAL PROTEIN: 6.8 g/dL (ref 6.0–8.5)

## 2016-01-30 LAB — CBC WITH DIFFERENTIAL/PLATELET
BASOS: 0 %
Basophils Absolute: 0 10*3/uL (ref 0.0–0.2)
EOS (ABSOLUTE): 0.2 10*3/uL (ref 0.0–0.4)
EOS: 3 %
HEMATOCRIT: 36.1 % — AB (ref 37.5–51.0)
Hemoglobin: 11.6 g/dL — ABNORMAL LOW (ref 12.6–17.7)
IMMATURE GRANS (ABS): 0 10*3/uL (ref 0.0–0.1)
IMMATURE GRANULOCYTES: 0 %
LYMPHS: 37 %
Lymphocytes Absolute: 2.5 10*3/uL (ref 0.7–3.1)
MCH: 30.4 pg (ref 26.6–33.0)
MCHC: 32.1 g/dL (ref 31.5–35.7)
MCV: 95 fL (ref 79–97)
MONOS ABS: 0.6 10*3/uL (ref 0.1–0.9)
Monocytes: 9 %
NEUTROS PCT: 51 %
Neutrophils Absolute: 3.5 10*3/uL (ref 1.4–7.0)
Platelets: 128 10*3/uL — ABNORMAL LOW (ref 150–379)
RBC: 3.81 x10E6/uL — ABNORMAL LOW (ref 4.14–5.80)
RDW: 14.9 % (ref 12.3–15.4)
WBC: 6.8 10*3/uL (ref 3.4–10.8)

## 2016-01-30 MED ORDER — LEVOFLOXACIN 500 MG PO TABS: 500.0000 mg | ORAL_TABLET | Freq: Every day | ORAL | Status: DC

## 2016-01-30 MED ORDER — ALBUTEROL SULFATE HFA 108 (90 BASE) MCG/ACT IN AERS: 2.0000 | INHALATION_SPRAY | Freq: Four times a day (QID) | RESPIRATORY_TRACT | Status: DC | PRN

## 2016-01-30 NOTE — Telephone Encounter (Signed)
Prescription sent to pharmacy.

## 2016-02-05 ENCOUNTER — Other Ambulatory Visit: Payer: Self-pay | Admitting: Family

## 2016-02-06 ENCOUNTER — Other Ambulatory Visit: Payer: Self-pay | Admitting: Family

## 2016-02-06 ENCOUNTER — Ambulatory Visit (HOSPITAL_COMMUNITY)
Admission: RE | Admit: 2016-02-06 | Discharge: 2016-02-06 | Disposition: A | Discharge status: Home / Self Care | Payer: Medicare Other | Source: Ambulatory Visit | Attending: Family Medicine | Admitting: Family Medicine

## 2016-02-06 ENCOUNTER — Ambulatory Visit (INDEPENDENT_AMBULATORY_CARE_PROVIDER_SITE_OTHER): Payer: Medicare Other

## 2016-02-06 ENCOUNTER — Ambulatory Visit (INDEPENDENT_AMBULATORY_CARE_PROVIDER_SITE_OTHER): Payer: Medicare Other | Admitting: Family Medicine

## 2016-02-06 ENCOUNTER — Encounter: Payer: Self-pay | Admitting: Family Medicine

## 2016-02-06 VITALS — BP 129/65 | HR 56 | Temp 96.8°F | Ht 75.0 in | Wt 178.2 lb

## 2016-02-06 DIAGNOSIS — I2510 Atherosclerotic heart disease of native coronary artery without angina pectoris: Secondary | ICD-10-CM | POA: Insufficient documentation

## 2016-02-06 DIAGNOSIS — J438 Other emphysema: Secondary | ICD-10-CM | POA: Diagnosis not present

## 2016-02-06 DIAGNOSIS — I517 Cardiomegaly: Secondary | ICD-10-CM | POA: Insufficient documentation

## 2016-02-06 DIAGNOSIS — I77810 Thoracic aortic ectasia: Secondary | ICD-10-CM | POA: Insufficient documentation

## 2016-02-06 DIAGNOSIS — R0602 Shortness of breath: Secondary | ICD-10-CM

## 2016-02-06 MED ORDER — IOPAMIDOL (ISOVUE-370) INJECTION 76%
100.0000 mL | Freq: Once | INTRAVENOUS | Status: AC | PRN
  Administered 2016-02-06: 100 mL via INTRAVENOUS

## 2016-02-06 NOTE — Telephone Encounter (Signed)
Patient is being seen by provider.

## 2016-02-06 NOTE — Progress Notes (Signed)
BP 129/65 mmHg  Pulse 56  Temp(Src) 96.8 F (36 C) (Oral)  Ht '6\' 3"'$  (1.905 m)  Wt 178 lb 3.2 oz (80.831 kg)  BMI 22.27 kg/m2  SpO2 100%   Subjective:    Patient ID: Ryan [name], male    DOB: 1935-07-02, 81 y.o.   MRN: [id number]  HPI: Ryan [name] is a 81 y.o. male presenting on 02/06/2016 for Shortness of Breath   HPI Shortness of breath Patient comes in today with shortness of breath that has been persistent. He says is worse when he lays flat or when he is up and moving around. He came in on the liver week ago and saw one of my colleagues was diagnosed with community acquired pneumonia was given Levaquin and albuterol inhaler and he has finished the Levaquin and is using albuterol inhaler but does not feel like they have been benefiting him. He denies any fevers or chills. He does have a cough but is mostly nonproductive.  Relevant past medical, surgical, family and social history reviewed and updated as indicated. Interim medical history since our last visit reviewed. Allergies and medications reviewed and updated.  Review of Systems  Constitutional: Negative for fever and chills.  HENT: Positive for congestion. Negative for ear discharge, ear pain, postnasal drip, rhinorrhea, sinus pressure, sneezing, sore throat and voice change.   Eyes: Negative for pain, discharge, redness and visual disturbance.  Respiratory: Positive for cough and shortness of breath. Negative for chest tightness and wheezing.   Cardiovascular: Negative for chest pain and leg swelling.  Gastrointestinal: Negative for abdominal pain, diarrhea and constipation.  Genitourinary: Negative for difficulty urinating.  Musculoskeletal: Negative for back pain and gait problem.  Skin: Negative for rash.  Neurological: Negative for syncope, light-headedness and headaches.  All other systems reviewed and are negative.   Per HPI unless specifically indicated above     Medication List         This list is accurate as of: 02/06/16 11:17 AM.  Always use your most recent med list.               albuterol 108 (90 Base) MCG/ACT inhaler  Commonly known as:  PROVENTIL HFA;VENTOLIN HFA  Inhale 2 puffs into the lungs every 6 (six) hours as needed for wheezing or shortness of breath.     aspirin EC 81 MG tablet  Take 81 mg by mouth at bedtime.     atorvastatin 40 MG tablet  Commonly known as:  LIPITOR  Take 40 mg by mouth daily.     furosemide 20 MG tablet  Commonly known as:  LASIX  TAKE ONE TABLET BY MOUTH ONCE DAILY AT NOON     Garlic 0272 MG Caps  Take 3 capsules by mouth every morning.     Ginger 500 MG Caps  Take by mouth.     hydrALAZINE 50 MG tablet  Commonly known as:  APRESOLINE  TAKE ONE TABLET BY MOUTH EVERY 12 HOURS     isosorbide mononitrate 30 MG 24 hr tablet  Commonly known as:  IMDUR  Take 0.5 tablets (15 mg total) by mouth daily.     potassium chloride 10 MEQ tablet  Commonly known as:  K-DUR  Take 1 tablet (10 mEq total) by mouth daily.     vitamin C 100 MG tablet  Take 100 mg by mouth daily.           Objective:    BP 129/65 mmHg  Pulse 56  Temp(Src) 96.8 F (36 C) (Oral)  Ht '6\' 3"'$  (1.905 m)  Wt 178 lb 3.2 oz (80.831 kg)  BMI 22.27 kg/m2  SpO2 100%  Wt Readings from Last 3 Encounters:  02/06/16 178 lb 3.2 oz (80.831 kg)  01/29/16 180 lb (81.647 kg)  01/28/16 183 lb (83.008 kg)    Physical Exam  Constitutional: He is oriented to person, place, and time. He appears well-developed and well-nourished. No distress.  HENT:  Right Ear: Tympanic membrane, external ear and ear canal normal.  Left Ear: Tympanic membrane, external ear and ear canal normal.  Nose: Mucosal edema and rhinorrhea present. No sinus tenderness. No epistaxis. Right sinus exhibits maxillary sinus tenderness. Right sinus exhibits no frontal sinus tenderness. Left sinus exhibits maxillary sinus tenderness. Left sinus exhibits no frontal sinus tenderness.   Mouth/Throat: Uvula is midline and mucous membranes are normal. Posterior oropharyngeal edema and posterior oropharyngeal erythema present. No oropharyngeal exudate or tonsillar abscesses.  Eyes: Conjunctivae and EOM are normal. Pupils are equal, round, and reactive to light. Right eye exhibits no discharge. No scleral icterus.  Neck: Neck supple. No thyromegaly present.  Cardiovascular: Normal rate, regular rhythm, normal heart sounds and intact distal pulses.   No murmur heard. Pulmonary/Chest: Effort normal. No respiratory distress. He has decreased breath sounds in the right lower field and the left lower field. He has no wheezes. He has no rales.  Musculoskeletal: Normal range of motion. He exhibits no edema.  Lymphadenopathy:    He has no cervical adenopathy.  Neurological: He is alert and oriented to person, place, and time. Coordination normal.  Skin: Skin is warm and dry. No rash noted. He is not diaphoretic.  Psychiatric: He has a normal mood and affect. His behavior is normal.  Nursing note and vitals reviewed.   Results for orders placed or performed in visit on 01/29/16  CMP14+EGFR  Result Value Ref Range   Glucose 116 (H) 65 - 99 mg/dL   BUN 21 8 - 27 mg/dL   Creatinine, Ser 1.02 0.76 - 1.27 mg/dL   GFR calc non Af Amer 70 >59 mL/min/1.73   GFR calc Af Amer 81 >59 mL/min/1.73   BUN/Creatinine Ratio 21 10 - 24   Sodium 142 134 - 144 mmol/L   Potassium 4.0 3.5 - 5.2 mmol/L   Chloride 102 96 - 106 mmol/L   CO2 22 18 - 29 mmol/L   Calcium 9.1 8.6 - 10.2 mg/dL   Total Protein 6.8 6.0 - 8.5 g/dL   Albumin 4.0 3.5 - 4.8 g/dL   Globulin, Total 2.8 1.5 - 4.5 g/dL   Albumin/Globulin Ratio 1.4 1.2 - 2.2   Bilirubin Total 0.7 0.0 - 1.2 mg/dL   Alkaline Phosphatase 92 39 - 117 IU/L   AST 15 0 - 40 IU/L   ALT 12 0 - 44 IU/L  CBC with Differential/Platelet  Result Value Ref Range   WBC 6.8 3.4 - 10.8 x10E3/uL   RBC 3.81 (L) 4.14 - 5.80 x10E6/uL   Hemoglobin 11.6 (L) 12.6 -  17.7 g/dL   Hematocrit 36.1 (L) 37.5 - 51.0 %   MCV 95 79 - 97 fL   MCH 30.4 26.6 - 33.0 pg   MCHC 32.1 31.5 - 35.7 g/dL   RDW 14.9 12.3 - 15.4 %   Platelets 128 (L) 150 - 379 x10E3/uL   Neutrophils 51 %   Lymphs 37 %   Monocytes 9 %   Eos 3 %   Basos 0 %  Neutrophils Absolute 3.5 1.4 - 7.0 x10E3/uL   Lymphocytes Absolute 2.5 0.7 - 3.1 x10E3/uL   Monocytes Absolute 0.6 0.1 - 0.9 x10E3/uL   EOS (ABSOLUTE) 0.2 0.0 - 0.4 x10E3/uL   Basophils Absolute 0.0 0.0 - 0.2 x10E3/uL   Immature Granulocytes 0 %   Immature Grans (Abs) 0.0 0.0 - 0.1 x10E3/uL   Chest x-ray:Unchanged from previous, will send patient for CT    Assessment & Plan:   Problem List Items Addressed This Visit    None    Visit Diagnoses    Shortness of breath    -  Primary    Relevant Orders    DG Chest 2 View    CT Angio Chest PE W/Cm &/Or Wo Cm       Follow up plan: Return in about 2 weeks (around 02/20/2016), or if symptoms worsen or fail to improve, for Follow up shortness of breath.  Counseling provided for all of the vaccine components Orders Placed This Encounter  Procedures  . DG Chest 2 View    Caryl Pina, MD [facility] 02/06/2016, 11:17 AM

## 2016-02-07 ENCOUNTER — Telehealth: Payer: Self-pay | Admitting: Family Medicine

## 2016-02-10 NOTE — Telephone Encounter (Signed)
Spoke with pt and reviewed medications with him. He states he did pick up his inhaler and was just a little confused when he called.

## 2016-02-18 ENCOUNTER — Other Ambulatory Visit: Payer: Self-pay | Admitting: Family Medicine

## 2016-02-24 ENCOUNTER — Ambulatory Visit (INDEPENDENT_AMBULATORY_CARE_PROVIDER_SITE_OTHER): Payer: Medicare Other | Admitting: Family

## 2016-02-24 ENCOUNTER — Encounter: Payer: Self-pay | Admitting: Family

## 2016-02-24 VITALS — BP 131/61 | HR 54 | Temp 97.0°F | Ht 75.0 in | Wt 182.0 lb

## 2016-02-24 DIAGNOSIS — J439 Emphysema, unspecified: Secondary | ICD-10-CM

## 2016-02-24 DIAGNOSIS — R0602 Shortness of breath: Secondary | ICD-10-CM | POA: Diagnosis not present

## 2016-02-24 DIAGNOSIS — I712 Thoracic aortic aneurysm, without rupture, unspecified: Secondary | ICD-10-CM

## 2016-02-24 DIAGNOSIS — J449 Chronic obstructive pulmonary disease, unspecified: Secondary | ICD-10-CM | POA: Insufficient documentation

## 2016-02-24 MED ORDER — ALBUTEROL SULFATE (2.5 MG/3ML) 0.083% IN NEBU: 2.5000 mg | INHALATION_SOLUTION | Freq: Four times a day (QID) | RESPIRATORY_TRACT | Status: DC | PRN

## 2016-02-24 MED ORDER — FLUTICASONE FUROATE-VILANTEROL 100-25 MCG/INH IN AEPB
1.0000 | INHALATION_SPRAY | Freq: Every day | RESPIRATORY_TRACT | Status: DC
Start: 2016-02-24 — End: 2017-10-21

## 2016-02-24 NOTE — Patient Instructions (Signed)
Chronic Obstructive Pulmonary Disease Chronic obstructive pulmonary disease (COPD) is a common lung condition in which airflow from the lungs is limited. COPD is a general term that can be used to describe many different lung problems that limit airflow, including both chronic bronchitis and emphysema. If you have COPD, your lung function will probably never return to normal, but there are measures you can take to improve lung function and make yourself feel better. CAUSES   Smoking (common).  Exposure to secondhand smoke.  Genetic problems.  Chronic inflammatory lung diseases or recurrent infections. SYMPTOMS  Shortness of breath, especially with physical activity.  Deep, persistent (chronic) cough with a large amount of thick mucus.  Wheezing.  Rapid breaths (tachypnea).  Gray or bluish discoloration (cyanosis) of the skin, especially in your fingers, toes, or lips.  Fatigue.  Weight loss.  Frequent infections or episodes when breathing symptoms become much worse (exacerbations).  Chest tightness. DIAGNOSIS Your health care provider will take a medical history and perform a physical examination to diagnose COPD. Additional tests for COPD may include:  Lung (pulmonary) function tests.  Chest X-ray.  CT scan.  Blood tests. TREATMENT  Treatment for COPD may include:  Inhaler and nebulizer medicines. These help manage the symptoms of COPD and make your breathing more comfortable.  Supplemental oxygen. Supplemental oxygen is only helpful if you have a low oxygen level in your blood.  Exercise and physical activity. These are beneficial for nearly all people with COPD.  Lung surgery or transplant.  Nutrition therapy to gain weight, if you are underweight.  Pulmonary rehabilitation. This may involve working with a team of health care providers and specialists, such as respiratory, occupational, and physical therapists. HOME CARE INSTRUCTIONS  Take all medicines  (inhaled or pills) as directed by your health care provider.  Avoid over-the-counter medicines or cough syrups that dry up your airway (such as antihistamines) and slow down the elimination of secretions unless instructed otherwise by your health care provider.  If you are a smoker, the most important thing that you can do is stop smoking. Continuing to smoke will cause further lung damage and breathing trouble. Ask your health care provider for help with quitting smoking. He or she can direct you to community resources or hospitals that provide support.  Avoid exposure to irritants such as smoke, chemicals, and fumes that aggravate your breathing.  Use oxygen therapy and pulmonary rehabilitation if directed by your health care provider. If you require home oxygen therapy, ask your health care provider whether you should purchase a pulse oximeter to measure your oxygen level at home.  Avoid contact with individuals who have a contagious illness.  Avoid extreme temperature and humidity changes.  Eat healthy foods. Eating smaller, more frequent meals and resting before meals may help you maintain your strength.  Stay active, but balance activity with periods of rest. Exercise and physical activity will help you maintain your ability to do things you want to do.  Preventing infection and hospitalization is very important when you have COPD. Make sure to receive all the vaccines your health care provider recommends, especially the pneumococcal and influenza vaccines. Ask your health care provider whether you need a pneumonia vaccine.  Learn and use relaxation techniques to manage stress.  Learn and use controlled breathing techniques as directed by your health care provider. Controlled breathing techniques include:  Pursed lip breathing. Start by breathing in (inhaling) through your nose for 1 second. Then, purse your lips as if you were   going to whistle and breathe out (exhale) through the  pursed lips for 2 seconds.  Diaphragmatic breathing. Start by putting one hand on your abdomen just above your waist. Inhale slowly through your nose. The hand on your abdomen should move out. Then purse your lips and exhale slowly. You should be able to feel the hand on your abdomen moving in as you exhale.  Learn and use controlled coughing to clear mucus from your lungs. Controlled coughing is a series of short, progressive coughs. The steps of controlled coughing are: 1. Lean your head slightly forward. 2. Breathe in deeply using diaphragmatic breathing. 3. Try to hold your breath for 3 seconds. 4. Keep your mouth slightly open while coughing twice. 5. Spit any mucus out into a tissue. 6. Rest and repeat the steps once or twice as needed. SEEK MEDICAL CARE IF:  You are coughing up more mucus than usual.  There is a change in the color or thickness of your mucus.  Your breathing is more labored than usual.  Your breathing is faster than usual. SEEK IMMEDIATE MEDICAL CARE IF:  You have shortness of breath while you are resting.  You have shortness of breath that prevents you from:  Being able to talk.  Performing your usual physical activities.  You have chest pain lasting longer than 5 minutes.  Your skin color is more cyanotic than usual.  You measure low oxygen saturations for longer than 5 minutes with a pulse oximeter. MAKE SURE YOU:  Understand these instructions.  Will watch your condition.  Will get help right away if you are not doing well or get worse.   This information is not intended to replace advice given to you by your health care provider. Make sure you discuss any questions you have with your health care provider.   Document Released: 06/09/2005 Document Revised: 09/20/2014 Document Reviewed: 04/26/2013 Elsevier Interactive Patient Education 2016 [employer]  

## 2016-02-24 NOTE — Progress Notes (Signed)
   Subjective:    Patient ID: Ryan [name], male    DOB: 03-25-1935, 81 y.o.   MRN: [id number]  HPI PT presents to the office today to recheck SOB and COPD. Pt has chest x-ray on 02/06/16 that showed COPD/Emphysema and pt then had a CT scan on 02/06/16 that showed emphysema and an aortic aneurysm that recommended follow up in one year. Pt states he continues to be short of breath. Pt states he has not smoked since 1967.     Review of Systems  HENT: Negative.   Respiratory: Positive for cough and shortness of breath.   Cardiovascular: Negative for palpitations and leg swelling.  Genitourinary: Negative for flank pain.       Objective:   Physical Exam  Constitutional: He is oriented to person, place, and time. He appears well-developed and well-nourished. No distress.  HENT:  Head: Normocephalic.  Cardiovascular: Normal rate, regular rhythm, normal heart sounds and intact distal pulses.   No murmur heard. Pulmonary/Chest: Effort normal and breath sounds normal. No respiratory distress. He has no wheezes.  Abdominal: Soft. Bowel sounds are normal. He exhibits no distension. There is no tenderness.  Musculoskeletal: Normal range of motion. He exhibits no edema or tenderness.  Neurological: He is alert and oriented to person, place, and time.  Skin: Skin is warm and dry. No rash noted. No erythema.  Psychiatric: He has a normal mood and affect. His behavior is normal. Judgment and thought content normal.  Vitals reviewed.     BP 131/61 mmHg  Pulse 54  Temp(Src) 97 F (36.1 C) (Oral)  Ht 6\' 3"  (1.905 m)  Wt 182 lb (82.555 kg)  BMI 22.75 kg/m2  SpO2 99%     Assessment & Plan:  1. Pulmonary emphysema, unspecified emphysema type (HCC) -Pt started on Breo today -Stay active - fluticasone furoate-vilanterol (BREO ELLIPTA) 100-25 MCG/INH AEPB; Inhale 1 puff into the lungs daily.  Dispense: 60 each; Refill: 3 - DME Nebulizer/meds - albuterol (PROVENTIL) (2.5 MG/3ML) 0.083%  nebulizer solution; Take 3 mLs (2.5 mg total) by nebulization every 6 (six) hours as needed for wheezing or shortness of breath.  Dispense: 150 mL; Refill: 1  2. SOB (shortness of breath) - fluticasone furoate-vilanterol (BREO ELLIPTA) 100-25 MCG/INH AEPB; Inhale 1 puff into the lungs daily.  Dispense: 60 each; Refill: 3 - DME Nebulizer/meds - albuterol (PROVENTIL) (2.5 MG/3ML) 0.083% nebulizer solution; Take 3 mLs (2.5 mg total) by nebulization every 6 (six) hours as needed for wheezing or shortness of breath.  Dispense: 150 mL; Refill: 1  3. Thoracic aortic aneurysm without rupture (HCC) -Keep appts with PCP - fluticasone furoate-vilanterol (BREO ELLIPTA) 100-25 MCG/INH AEPB; Inhale 1 puff into the lungs daily.  Dispense: 60 each; Refill: 3 - DME Nebulizer/meds - albuterol (PROVENTIL) (2.5 MG/3ML) 0.083% nebulizer solution; Take 3 mLs (2.5 mg total) by nebulization every 6 (six) hours as needed for wheezing or shortness of breath.  Dispense: 150 mL; Refill: 1  Jannifer Rodney, FNP

## 2016-02-26 ENCOUNTER — Telehealth: Payer: Self-pay | Admitting: Family

## 2016-02-26 ENCOUNTER — Telehealth: Payer: Self-pay | Admitting: Family Medicine

## 2016-02-26 ENCOUNTER — Ambulatory Visit: Payer: Medicare Other | Admitting: Cardiology

## 2016-02-26 DIAGNOSIS — J439 Emphysema, unspecified: Secondary | ICD-10-CM

## 2016-02-26 NOTE — Telephone Encounter (Signed)
Send him a spacer that he continues with his albuterol inhaler. It can work very similar. Tell him to take his albuterol inhaler in the pharmacy with them and have the pharmacist teach him how to use the spacer

## 2016-02-26 NOTE — Telephone Encounter (Signed)
RX faxed to [facility] per pt request Pt notified

## 2016-02-27 NOTE — Telephone Encounter (Signed)
Pt walked into the clinic this morning and needed help putting the nebulizer machine together and advice on how to use the Breo. Nebulizer machine put together and explained how to start it and where to add the medication. Showed the pt how to use the [facility] and pt voiced understanding on how to use both.

## 2016-04-22 ENCOUNTER — Ambulatory Visit: Payer: Medicare Other | Admitting: Family Medicine

## 2016-04-22 ENCOUNTER — Ambulatory Visit (INDEPENDENT_AMBULATORY_CARE_PROVIDER_SITE_OTHER): Payer: Medicare Other | Admitting: Family

## 2016-04-22 ENCOUNTER — Encounter: Payer: Self-pay | Admitting: Family

## 2016-04-22 VITALS — BP 116/58 | HR 57 | Temp 97.1°F | Ht 75.0 in | Wt 178.1 lb

## 2016-04-22 DIAGNOSIS — F0390 Unspecified dementia without behavioral disturbance: Secondary | ICD-10-CM

## 2016-04-22 DIAGNOSIS — I5031 Acute diastolic (congestive) heart failure: Secondary | ICD-10-CM

## 2016-04-22 DIAGNOSIS — J439 Emphysema, unspecified: Secondary | ICD-10-CM

## 2016-04-22 DIAGNOSIS — E785 Hyperlipidemia, unspecified: Secondary | ICD-10-CM

## 2016-04-22 DIAGNOSIS — I10 Essential (primary) hypertension: Secondary | ICD-10-CM | POA: Diagnosis not present

## 2016-04-22 DIAGNOSIS — E119 Type 2 diabetes mellitus without complications: Secondary | ICD-10-CM

## 2016-04-22 LAB — BAYER DCA HB A1C WAIVED: HB A1C: 6.3 % (ref <7.0)

## 2016-04-22 NOTE — Addendum Note (Signed)
Addended by: Jannifer Rodney A on: 04/22/2016 11:15 AM   Modules accepted: Orders

## 2016-04-22 NOTE — Patient Instructions (Signed)
Diabetes Mellitus and Food It is important for you to manage your blood sugar (glucose) level. Your blood glucose level can be greatly affected by what you eat. Eating healthier foods in the appropriate amounts throughout the day at about the same time each day will help you control your blood glucose level. It can also help slow or prevent worsening of your diabetes mellitus. Healthy eating may even help you improve the level of your blood pressure and reach or maintain a healthy weight.  General recommendations for healthful eating and cooking habits include:  Eating meals and snacks regularly. Avoid going long periods of time without eating to lose weight.  Eating a diet that consists mainly of plant-based foods, such as fruits, vegetables, nuts, legumes, and whole grains.  Using low-heat cooking methods, such as baking, instead of high-heat cooking methods, such as deep frying. Work with your dietitian to make sure you understand how to use the Nutrition Facts information on food labels. HOW CAN FOOD AFFECT ME? Carbohydrates Carbohydrates affect your blood glucose level more than any other type of food. Your dietitian will help you determine how many carbohydrates to eat at each meal and teach you how to count carbohydrates. Counting carbohydrates is important to keep your blood glucose at a healthy level, especially if you are using insulin or taking certain medicines for diabetes mellitus. Alcohol Alcohol can cause sudden decreases in blood glucose (hypoglycemia), especially if you use insulin or take certain medicines for diabetes mellitus. Hypoglycemia can be a life-threatening condition. Symptoms of hypoglycemia (sleepiness, dizziness, and disorientation) are similar to symptoms of having too much alcohol.  If your health care provider has given you approval to drink alcohol, do so in moderation and use the following guidelines:  Women should not have more than one drink per day, and men  should not have more than two drinks per day. One drink is equal to:  12 oz of beer.  5 oz of wine.  1 oz of hard liquor.  Do not drink on an empty stomach.  Keep yourself hydrated. Have water, diet soda, or unsweetened iced tea.  Regular soda, juice, and other mixers might contain a lot of carbohydrates and should be counted. WHAT FOODS ARE NOT RECOMMENDED? As you make food choices, it is important to remember that all foods are not the same. Some foods have fewer nutrients per serving than other foods, even though they might have the same number of calories or carbohydrates. It is difficult to get your body what it needs when you eat foods with fewer nutrients. Examples of foods that you should avoid that are high in calories and carbohydrates but low in nutrients include:  Trans fats (most processed foods list trans fats on the Nutrition Facts label).  Regular soda.  Juice.  Candy.  Sweets, such as cake, pie, doughnuts, and cookies.  Fried foods. WHAT FOODS CAN I EAT? Eat nutrient-rich foods, which will nourish your body and keep you healthy. The food you should eat also will depend on several factors, including:  The calories you need.  The medicines you take.  Your weight.  Your blood glucose level.  Your blood pressure level.  Your cholesterol level. You should eat a variety of foods, including:  Protein.  Lean cuts of meat.  Proteins low in saturated fats, such as fish, egg whites, and beans. Avoid processed meats.  Fruits and vegetables.  Fruits and vegetables that may help control blood glucose levels, such as apples, mangoes, and   yams.  Dairy products.  Choose fat-free or low-fat dairy products, such as milk, yogurt, and cheese.  Grains, bread, pasta, and rice.  Choose whole grain products, such as multigrain bread, whole oats, and brown rice. These foods may help control blood pressure.  Fats.  Foods containing healthful fats, such as nuts,  avocado, olive oil, canola oil, and fish. DOES EVERYONE WITH DIABETES MELLITUS HAVE THE SAME MEAL PLAN? Because every person with diabetes mellitus is different, there is not one meal plan that works for everyone. It is very important that you meet with a dietitian who will help you create a meal plan that is just right for you.   This information is not intended to replace advice given to you by your health care provider. Make sure you discuss any questions you have with your health care provider.   Document Released: 05/27/2005 Document Revised: 09/20/2014 Document Reviewed: 07/27/2013 Elsevier Interactive Patient Education 2016 [employer]  

## 2016-04-22 NOTE — Progress Notes (Signed)
Subjective:    Patient ID: Ryan [name], male    DOB: 1935/06/02, 81 y.o.   MRN: [id number]  Pt presents to the office today chronic follow up. PT is a poor historian and has dementia.Per EPIC pt has been seen by [facility] on 01/28/16 with stable CAD, CHF, and cardiomyopathy with LVEF 45-50%.  Shortness of Breath  This is a recurrent problem. The current episode started more than 1 year ago. The problem occurs constantly. The problem has been waxing and waning. Pertinent negatives include no ear pain, fever, headaches, leg swelling, sputum production or wheezing. He has tried rest for the symptoms. The treatment provided mild relief. His past medical history is significant for CAD, COPD and a heart failure. There is no history of allergies.  Diabetes  He presents for his follow-up diabetic visit. He has type 2 diabetes mellitus. His disease course has been stable. There are no hypoglycemic associated symptoms. Pertinent negatives for hypoglycemia include no headaches. Pertinent negatives for diabetes include no blurred vision, no fatigue, no foot paresthesias and no polydipsia. Symptoms are stable. Diabetic complications include heart disease. Pertinent negatives for diabetic complications include no CVA, nephropathy or peripheral neuropathy. Risk factors for coronary artery disease include male sex and hypertension. Current diabetic treatment includes diet. His weight is stable. He is following a generally healthy diet. (Pt does not take blood glucose at home )  Hypertension  This is a chronic problem. The current episode started more than 1 year ago. The problem has been resolved since onset. The problem is controlled. Associated symptoms include shortness of breath. Pertinent negatives include no blurred vision, headaches, malaise/fatigue or peripheral edema. Risk factors for coronary artery disease include male gender and sedentary lifestyle. Past treatments include diuretics. Hypertensive  end-organ damage includes CAD/MI and heart failure. There is no history of kidney disease or CVA.  Hyperlipidemia  This is a chronic problem. The current episode started more than 1 year ago. The problem is controlled. Recent lipid tests were reviewed and are normal. Associated symptoms include shortness of breath. Current antihyperlipidemic treatment includes statins. The current treatment provides moderate improvement of lipids. Risk factors for coronary artery disease include diabetes mellitus, dyslipidemia, male sex and a sedentary lifestyle.  COPD PT currently using Breo at least once to twice a week. Pt states this seems to help. Discussed using everyday.    Review of Systems  Constitutional: Negative for fatigue, fever and malaise/fatigue.  HENT: Negative for ear pain.   Eyes: Negative for blurred vision.  Respiratory: Positive for cough and shortness of breath. Negative for sputum production and wheezing.   Cardiovascular: Negative for leg swelling.  Endocrine: Negative for polydipsia.  Neurological: Negative for headaches.  All other systems reviewed and are negative.      Objective:   Physical Exam  Constitutional: He is oriented to person, place, and time. He appears well-developed and well-nourished. No distress.  HENT:  Head: Normocephalic.  Right Ear: External ear normal.  Left Ear: External ear normal.  Mouth/Throat: Oropharynx is clear and moist.  Eyes: Pupils are equal, round, and reactive to light. Right eye exhibits no discharge. Left eye exhibits no discharge.  Neck: Normal range of motion. Neck supple. No thyromegaly present.  Cardiovascular: Normal rate, regular rhythm, normal heart sounds and intact distal pulses.   No murmur heard. Pulmonary/Chest: Effort normal and breath sounds normal. No respiratory distress. He has no wheezes.  Diminished breath sounds   Abdominal: Soft. Bowel sounds are normal. He exhibits  no distension. There is no tenderness.    Musculoskeletal: Normal range of motion. He exhibits no edema or tenderness.  Neurological: He is alert and oriented to person, place, and time.  Skin: Skin is warm and dry. No rash noted. No erythema.  Psychiatric: He has a normal mood and affect. His behavior is normal. Judgment and thought content normal.  Vitals reviewed.      BP (!) 116/58   Pulse (!) 57   Temp 97.1 F (36.2 C) (Oral)   Ht '6\' 3"'$  (1.905 m)   Wt 178 lb 2 oz (80.8 kg)   BMI 22.26 kg/m   Assessment & Plan:  1. Essential hypertension, benign - CMP14+EGFR  2. Pulmonary emphysema, unspecified emphysema type ([place]) - CMP14+EGFR  3. Controlled type 2 diabetes mellitus without complication, without long-term current use of insulin (HCC) - CMP14+EGFR  4. Dementia, without behavioral disturbance - CMP14+EGFR  5. Acute diastolic heart failure, NYHA class 1 (HCC) - CMP14+EGFR  6. Hyperlipemia - CMP14+EGFR - Lipid panel   Continue all meds Labs pending Health Maintenance reviewed Diet and exercise encouraged RTO 3 months   Evelina Dun, FNP

## 2016-04-23 LAB — LIPID PANEL
CHOL/HDL RATIO: 2.4 ratio (ref 0.0–5.0)
CHOLESTEROL TOTAL: 122 mg/dL (ref 100–199)
HDL: 50 mg/dL (ref >39)
LDL CALC: 38 mg/dL (ref 0–99)
Triglycerides: 169 mg/dL — ABNORMAL HIGH (ref 0–149)
VLDL CHOLESTEROL CAL: 34 mg/dL (ref 5–40)

## 2016-04-23 LAB — CMP14+EGFR
ALBUMIN: 4.1 g/dL (ref 3.5–4.8)
ALK PHOS: 80 IU/L (ref 39–117)
ALT: 11 IU/L (ref 0–44)
AST: 19 IU/L (ref 0–40)
Albumin/Globulin Ratio: 1.4 (ref 1.2–2.2)
BUN/Creatinine Ratio: 21 (ref 10–24)
BUN: 22 mg/dL (ref 8–27)
Bilirubin Total: 0.7 mg/dL (ref 0.0–1.2)
CALCIUM: 9.6 mg/dL (ref 8.6–10.2)
CO2: 23 mmol/L (ref 18–29)
CREATININE: 1.07 mg/dL (ref 0.76–1.27)
Chloride: 103 mmol/L (ref 96–106)
GFR calc Af Amer: 76 mL/min/{1.73_m2} (ref >59)
GFR, EST NON AFRICAN AMERICAN: 66 mL/min/{1.73_m2} (ref >59)
GLOBULIN, TOTAL: 2.9 g/dL (ref 1.5–4.5)
GLUCOSE: 109 mg/dL — AB (ref 65–99)
Potassium: 4.4 mmol/L (ref 3.5–5.2)
SODIUM: 143 mmol/L (ref 134–144)
Total Protein: 7 g/dL (ref 6.0–8.5)

## 2016-04-30 ENCOUNTER — Observation Stay (HOSPITAL_COMMUNITY): Payer: Medicare Other

## 2016-04-30 ENCOUNTER — Emergency Department (HOSPITAL_COMMUNITY): Payer: Medicare Other

## 2016-04-30 ENCOUNTER — Observation Stay (HOSPITAL_COMMUNITY)
Admission: EM | Admit: 2016-04-30 | Discharge: 2016-05-01 | Disposition: A | Discharge status: Home / Self Care | Payer: Medicare Other | Attending: Internal Medicine | Admitting: Internal Medicine

## 2016-04-30 ENCOUNTER — Encounter (HOSPITAL_COMMUNITY): Payer: Self-pay | Admitting: Emergency Medicine

## 2016-04-30 DIAGNOSIS — I5031 Acute diastolic (congestive) heart failure: Secondary | ICD-10-CM | POA: Insufficient documentation

## 2016-04-30 DIAGNOSIS — Z79899 Other long term (current) drug therapy: Secondary | ICD-10-CM | POA: Insufficient documentation

## 2016-04-30 DIAGNOSIS — R42 Dizziness and giddiness: Secondary | ICD-10-CM | POA: Diagnosis not present

## 2016-04-30 DIAGNOSIS — J449 Chronic obstructive pulmonary disease, unspecified: Secondary | ICD-10-CM | POA: Diagnosis not present

## 2016-04-30 DIAGNOSIS — I429 Cardiomyopathy, unspecified: Secondary | ICD-10-CM

## 2016-04-30 DIAGNOSIS — J439 Emphysema, unspecified: Secondary | ICD-10-CM | POA: Diagnosis not present

## 2016-04-30 DIAGNOSIS — I679 Cerebrovascular disease, unspecified: Secondary | ICD-10-CM | POA: Diagnosis not present

## 2016-04-30 DIAGNOSIS — I110 Hypertensive heart disease with heart failure: Secondary | ICD-10-CM | POA: Diagnosis not present

## 2016-04-30 DIAGNOSIS — I2510 Atherosclerotic heart disease of native coronary artery without angina pectoris: Secondary | ICD-10-CM | POA: Insufficient documentation

## 2016-04-30 DIAGNOSIS — R410 Disorientation, unspecified: Secondary | ICD-10-CM | POA: Diagnosis not present

## 2016-04-30 DIAGNOSIS — E538 Deficiency of other specified B group vitamins: Secondary | ICD-10-CM | POA: Diagnosis present

## 2016-04-30 DIAGNOSIS — Z7982 Long term (current) use of aspirin: Secondary | ICD-10-CM | POA: Diagnosis not present

## 2016-04-30 DIAGNOSIS — D696 Thrombocytopenia, unspecified: Secondary | ICD-10-CM

## 2016-04-30 DIAGNOSIS — I10 Essential (primary) hypertension: Secondary | ICD-10-CM | POA: Diagnosis not present

## 2016-04-30 DIAGNOSIS — D649 Anemia, unspecified: Secondary | ICD-10-CM | POA: Diagnosis present

## 2016-04-30 DIAGNOSIS — R404 Transient alteration of awareness: Secondary | ICD-10-CM | POA: Diagnosis not present

## 2016-04-30 DIAGNOSIS — E119 Type 2 diabetes mellitus without complications: Secondary | ICD-10-CM | POA: Diagnosis not present

## 2016-04-30 DIAGNOSIS — F0390 Unspecified dementia without behavioral disturbance: Secondary | ICD-10-CM | POA: Diagnosis not present

## 2016-04-30 DIAGNOSIS — I6623 Occlusion and stenosis of bilateral posterior cerebral arteries: Secondary | ICD-10-CM | POA: Diagnosis not present

## 2016-04-30 DIAGNOSIS — R0602 Shortness of breath: Secondary | ICD-10-CM | POA: Insufficient documentation

## 2016-04-30 DIAGNOSIS — R55 Syncope and collapse: Principal | ICD-10-CM | POA: Diagnosis present

## 2016-04-30 HISTORY — DX: Deficiency of other specified B group vitamins: E53.8

## 2016-04-30 LAB — COMPREHENSIVE METABOLIC PANEL
ALK PHOS: 67 U/L (ref 38–126)
ALT: 18 U/L (ref 17–63)
ANION GAP: 5 (ref 5–15)
AST: 22 U/L (ref 15–41)
Albumin: 3.6 g/dL (ref 3.5–5.0)
BILIRUBIN TOTAL: 1 mg/dL (ref 0.3–1.2)
BUN: 19 mg/dL (ref 6–20)
CALCIUM: 9.1 mg/dL (ref 8.9–10.3)
CO2: 26 mmol/L (ref 22–32)
Chloride: 107 mmol/L (ref 101–111)
Creatinine, Ser: 0.98 mg/dL (ref 0.61–1.24)
Glucose, Bld: 116 mg/dL — ABNORMAL HIGH (ref 65–99)
Potassium: 3.9 mmol/L (ref 3.5–5.1)
SODIUM: 138 mmol/L (ref 135–145)
TOTAL PROTEIN: 6.8 g/dL (ref 6.5–8.1)

## 2016-04-30 LAB — CBC
HCT: 35.7 % — ABNORMAL LOW (ref 39.0–52.0)
HEMOGLOBIN: 11.8 g/dL — AB (ref 13.0–17.0)
MCH: 30.9 pg (ref 26.0–34.0)
MCHC: 33.1 g/dL (ref 30.0–36.0)
MCV: 93.5 fL (ref 78.0–100.0)
Platelets: 116 10*3/uL — ABNORMAL LOW (ref 150–400)
RBC: 3.82 MIL/uL — ABNORMAL LOW (ref 4.22–5.81)
RDW: 14.4 % (ref 11.5–15.5)
WBC: 7.2 10*3/uL (ref 4.0–10.5)

## 2016-04-30 LAB — RAPID URINE DRUG SCREEN, HOSP PERFORMED
Amphetamines: NOT DETECTED
Barbiturates: NOT DETECTED
Benzodiazepines: NOT DETECTED
Cocaine: NOT DETECTED
OPIATES: NOT DETECTED
Tetrahydrocannabinol: NOT DETECTED

## 2016-04-30 LAB — URINALYSIS, ROUTINE W REFLEX MICROSCOPIC
Bilirubin Urine: NEGATIVE
Glucose, UA: NEGATIVE mg/dL
Hgb urine dipstick: NEGATIVE
Ketones, ur: NEGATIVE mg/dL
LEUKOCYTES UA: NEGATIVE
NITRITE: NEGATIVE
PH: 6 (ref 5.0–8.0)
Protein, ur: NEGATIVE mg/dL
SPECIFIC GRAVITY, URINE: 1.015 (ref 1.005–1.030)

## 2016-04-30 LAB — DIFFERENTIAL
BASOS PCT: 0 %
Basophils Absolute: 0 10*3/uL (ref 0.0–0.1)
Eosinophils Absolute: 0.1 10*3/uL (ref 0.0–0.7)
Eosinophils Relative: 2 %
LYMPHS ABS: 2.2 10*3/uL (ref 0.7–4.0)
Lymphocytes Relative: 30 %
MONOS PCT: 8 %
Monocytes Absolute: 0.6 10*3/uL (ref 0.1–1.0)
NEUTROS ABS: 4.3 10*3/uL (ref 1.7–7.7)
NEUTROS PCT: 60 %

## 2016-04-30 LAB — BRAIN NATRIURETIC PEPTIDE: B NATRIURETIC PEPTIDE 5: 942 pg/mL — AB (ref 0.0–100.0)

## 2016-04-30 LAB — TROPONIN I: Troponin I: 0.03 ng/mL (ref <0.03)

## 2016-04-30 LAB — PROTIME-INR
INR: 1.14
PROTHROMBIN TIME: 14.6 s (ref 11.4–15.2)

## 2016-04-30 LAB — VITAMIN B12: VITAMIN B 12: 145 pg/mL — AB (ref 180–914)

## 2016-04-30 LAB — APTT: aPTT: 30 seconds (ref 24–36)

## 2016-04-30 LAB — GLUCOSE, CAPILLARY: GLUCOSE-CAPILLARY: 166 mg/dL — AB (ref 65–99)

## 2016-04-30 LAB — I-STAT TROPONIN, ED: TROPONIN I, POC: 0 ng/mL (ref 0.00–0.08)

## 2016-04-30 LAB — TSH: TSH: 1.477 u[IU]/mL (ref 0.350–4.500)

## 2016-04-30 MED ORDER — INSULIN ASPART 100 UNIT/ML ~~LOC~~ SOLN: 0.0000 [IU] | Freq: Every day | SUBCUTANEOUS | Status: DC

## 2016-04-30 MED ORDER — ACETAMINOPHEN 325 MG PO TABS: 650.0000 mg | ORAL_TABLET | Freq: Four times a day (QID) | ORAL | Status: DC | PRN

## 2016-04-30 MED ORDER — PNEUMOCOCCAL VAC POLYVALENT 25 MCG/0.5ML IJ INJ
0.5000 mL | INJECTION | INTRAMUSCULAR | Status: DC
  Filled 2016-04-30: qty 0.5

## 2016-04-30 MED ORDER — ATORVASTATIN CALCIUM 40 MG PO TABS
40.0000 mg | ORAL_TABLET | Freq: Every day | ORAL | Status: DC
  Administered 2016-04-30 – 2016-05-01 (×2): 40 mg via ORAL
  Filled 2016-04-30 (×2): qty 1

## 2016-04-30 MED ORDER — FLUTICASONE FUROATE-VILANTEROL 100-25 MCG/INH IN AEPB
1.0000 | INHALATION_SPRAY | Freq: Every day | RESPIRATORY_TRACT | Status: DC
  Administered 2016-04-30 – 2016-05-01 (×2): 1 via RESPIRATORY_TRACT
  Filled 2016-04-30: qty 28

## 2016-04-30 MED ORDER — ASPIRIN EC 81 MG PO TBEC
81.0000 mg | DELAYED_RELEASE_TABLET | ORAL | Status: DC
  Administered 2016-05-01: 81 mg via ORAL
  Filled 2016-04-30: qty 1

## 2016-04-30 MED ORDER — POTASSIUM CHLORIDE CRYS ER 10 MEQ PO TBCR
10.0000 meq | EXTENDED_RELEASE_TABLET | Freq: Every day | ORAL | Status: DC
  Administered 2016-05-01: 10 meq via ORAL
  Filled 2016-04-30: qty 1

## 2016-04-30 MED ORDER — HYDRALAZINE HCL 25 MG PO TABS
50.0000 mg | ORAL_TABLET | Freq: Two times a day (BID) | ORAL | Status: DC
Start: 2016-04-30 — End: 2016-05-01
  Administered 2016-04-30 – 2016-05-01 (×2): 50 mg via ORAL
  Filled 2016-04-30 (×2): qty 2

## 2016-04-30 MED ORDER — POTASSIUM CHLORIDE IN NACL 20-0.9 MEQ/L-% IV SOLN
INTRAVENOUS | Status: DC
  Administered 2016-04-30: 20:00:00 via INTRAVENOUS

## 2016-04-30 MED ORDER — FLUTICASONE FUROATE-VILANTEROL 100-25 MCG/INH IN AEPB
1.0000 | INHALATION_SPRAY | Freq: Every day | RESPIRATORY_TRACT | Status: DC
  Filled 2016-04-30: qty 28

## 2016-04-30 MED ORDER — ALBUTEROL SULFATE (2.5 MG/3ML) 0.083% IN NEBU: 2.5000 mg | INHALATION_SOLUTION | Freq: Four times a day (QID) | RESPIRATORY_TRACT | Status: DC | PRN

## 2016-04-30 MED ORDER — FUROSEMIDE 20 MG PO TABS
20.0000 mg | ORAL_TABLET | Freq: Every day | ORAL | Status: DC
  Administered 2016-05-01: 20 mg via ORAL
  Filled 2016-04-30: qty 1

## 2016-04-30 MED ORDER — VITAMIN C 500 MG PO TABS
250.0000 mg | ORAL_TABLET | Freq: Every day | ORAL | Status: DC
  Administered 2016-05-01: 250 mg via ORAL
  Filled 2016-04-30: qty 1

## 2016-04-30 MED ORDER — INSULIN ASPART 100 UNIT/ML ~~LOC~~ SOLN
0.0000 [IU] | Freq: Three times a day (TID) | SUBCUTANEOUS | Status: DC
  Administered 2016-05-01: 1 [IU] via SUBCUTANEOUS

## 2016-04-30 MED ORDER — ACETAMINOPHEN 650 MG RE SUPP: 650.0000 mg | Freq: Four times a day (QID) | RECTAL | Status: DC | PRN

## 2016-04-30 MED ORDER — ISOSORBIDE MONONITRATE ER 30 MG PO TB24
15.0000 mg | ORAL_TABLET | Freq: Every day | ORAL | Status: DC
Start: 2016-04-30 — End: 2016-05-01
  Administered 2016-04-30 – 2016-05-01 (×2): 15 mg via ORAL
  Filled 2016-04-30 (×2): qty 1

## 2016-04-30 NOTE — H&P (Signed)
History and Physical    [name] WUJ:[id number] DOB: Nov 26, 1934 DOA: 04/30/2016  PCP: Elige Radon Dettinger, MD   Patient coming from: home  Chief Complaint: lightheadedness  HPI: Ryan [name] is a 80 y.o. male with medical history significant for CAD, COPD, DM-2, hypertension, thrombocytopenia, and heart failure, who presents with a chief complaint of lightheadedness and feelings as if he was going to pass out.the patient was in his usual state of health, while he was doing business at the [employer] today. He reported getting up too quickly which caused him to be lightheaded and having the feeling as if he was going to pass out. He denies loss of consciousness. He denies associated headache, spinning dizziness, chest pain, palpitations, nausea, or vomiting.  ED Course: in the ED, he was afebrile and hemodynamically stable. His EKG revealed chronic abnormal changes with ST and T-wave abnormalities. His opponent I was negative.his other lab data were significant for a hemoglobin of 11.8, platelet count of 116, BNP of 942,negative urine drug screen, normal urinalysis, and normal TSH. His glucose was 116. His chest x-ray revealed suspected airways disease/bronchitis superimposed on advanced emphysema. His head CT revealed a suspicion of aneurysm in M1 segment; mild atrophy; and small vessel disease but no acute findings.MRA/MRI of the brain revealed no acute intracranial findings, but with chronic cerebrovascular disease and possibly 75% stenosis of the supraclinoid right ICA and severe stenosis of the proximal posterior cerebral arteries.he is being admitted for further evaluation and management.  Review of Systems: As per HPI; he does have issues with his memory and cannot remember  As well as he used to; otherwise 10 point review of systems negative.    Past Medical History:  Diagnosis Date  . Abnormal ECG   . Cardiomyopathy (HCC)    LVEF 45-50%  . Coronary atherosclerosis of  native coronary artery    Based on Myoview - managing medically  . Dementia   . Essential hypertension, benign   . Type 2 diabetes mellitus (HCC)     Past Surgical History:  Procedure Laterality Date  . HERNIA REPAIR    . HIP SURGERY    . KNEE SURGERY    . PROSTATE SURGERY      Social history: He is divorced. He has no children. He is retired. He lives alone. He reports that he quit smoking about 49 years ago. His smoking use included Cigarettes. He has a 15.00 pack-year smoking history. He has never used smokeless tobacco. He reports that he does not drink alcohol or use drugs.  Allergies  Allergen Reactions  . Lisinopril Cough    Family History  Problem Relation Age of Onset  . Heart attack Mother   . COPD Father      Prior to Admission medications   Medication Sig Start Date End Date Taking? Authorizing Provider  albuterol (PROVENTIL HFA;VENTOLIN HFA) 108 (90 Base) MCG/ACT inhaler Inhale 2 puffs into the lungs every 6 (six) hours as needed for wheezing or shortness of breath. 01/30/16  Yes [name], FNP  Ascorbic Acid (VITAMIN C) 100 MG tablet Take 100 mg by mouth daily.   Yes Historical Provider, MD  aspirin EC 81 MG tablet Take 81 mg by mouth every other day.  07/08/12  Yes Osvaldo Shipper, MD  atorvastatin (LIPITOR) 40 MG tablet TAKE ONE TABLET BY MOUTH ONCE DAILY 02/18/16  Yes Elige Radon Dettinger, MD  fluticasone furoate-vilanterol (BREO ELLIPTA) 100-25 MCG/INH AEPB Inhale 1 puff into the lungs daily.  02/24/16  Yes [name], FNP  furosemide (LASIX) 20 MG tablet TAKE ONE TABLET BY MOUTH ONCE DAILY AT NOON 12/19/15  Yes Antoine Poche, MD  Garlic 1000 MG CAPS Take 3 capsules by mouth every morning.    Yes Historical Provider, MD  Ginger 500 MG CAPS Take 1 capsule by mouth daily.    Yes Historical Provider, MD  hydrALAZINE (APRESOLINE) 50 MG tablet TAKE ONE TABLET BY MOUTH EVERY 12 HOURS 01/26/16  Yes Jodelle Gross, NP  isosorbide mononitrate (IMDUR) 30 MG 24  hr tablet Take 0.5 tablets (15 mg total) by mouth daily. 07/11/15  Yes Elige Radon Dettinger, MD  potassium chloride (K-DUR) 10 MEQ tablet Take 1 tablet (10 mEq total) by mouth daily. 06/09/15  Yes Jodelle Gross, NP  albuterol (PROVENTIL) (2.5 MG/3ML) 0.083% nebulizer solution Take 3 mLs (2.5 mg total) by nebulization every 6 (six) hours as needed for wheezing or shortness of breath. 02/24/16   [name], FNP    Physical Exam: Vitals:   04/30/16 1038 04/30/16 1218 04/30/16 1230 04/30/16 1300  BP: 152/82 146/80 147/92 146/78  Pulse: 64 70 66 69  Resp: 18 18 23 17   Temp: 98 F (36.7 C)     TempSrc: Oral     SpO2: 99% 99% 98% 95%  Weight: 81.6 kg (180 lb)     Height: 6\' 3"  (1.905 m)         Constitutional: NAD, calm, comfortable. He complains of hunger. Vitals:   04/30/16 1038 04/30/16 1218 04/30/16 1230 04/30/16 1300  BP: 152/82 146/80 147/92 146/78  Pulse: 64 70 66 69  Resp: 18 18 23 17   Temp: 98 F (36.7 C)     TempSrc: Oral     SpO2: 99% 99% 98% 95%  Weight: 81.6 kg (180 lb)     Height: 6\' 3"  (1.905 m)      Eyes: PERRL, lids and conjunctivae normal ENMT: Mucous membranes are mildly dry. Posterior pharynx clear of any exudate or lesions.Normal dentition.  Neck: normal, supple, no masses, no thyromegaly Respiratory: clear to auscultation bilaterally, no wheezing, no crackles. Normal respiratory effort. No accessory muscle use.  Cardiovascular: Regular rate and rhythm, no murmurs / rubs / gallops. No extremity edema. 2+ pedal pulses. No carotid bruits.  Abdomen: no tenderness, no masses palpated. No hepatosplenomegaly. Bowel sounds positive.  Musculoskeletal: no clubbing / cyanosis. No joint deformity upper and lower extremities. Good ROM, no contractures. Normal muscle tone.  Skin: no rashes, lesions, ulcers. No induration Neurologic: CN 2-12 grossly intact, except he is slightly hard of hearing. Sensation intact, DTR normal. Strength 5/5 in all 4.  Psychiatric: Normal  judgment and insight. Alert and oriented x 3. Normal mood.    Labs on Admission: I have personally reviewed following labs and imaging studies  CBC:  Recent Labs Lab 04/30/16 1124  WBC 7.2  NEUTROABS 4.3  HGB 11.8*  HCT 35.7*  MCV 93.5  PLT 116*   Basic Metabolic Panel:  Recent Labs Lab 04/30/16 1124  NA 138  K 3.9  CL 107  CO2 26  GLUCOSE 116*  BUN 19  CREATININE 0.98  CALCIUM 9.1   GFR: Estimated Creatinine Clearance: 70.5 mL/min (by C-G formula based on SCr of 0.98 mg/dL). Liver Function Tests:  Recent Labs Lab 04/30/16 1124  AST 22  ALT 18  ALKPHOS 67  BILITOT 1.0  PROT 6.8  ALBUMIN 3.6   No results for input(s): LIPASE, AMYLASE in the last 168  hours. No results for input(s): AMMONIA in the last 168 hours. Coagulation Profile:  Recent Labs Lab 04/30/16 1124  INR 1.14   Cardiac Enzymes: No results for input(s): CKTOTAL, CKMB, CKMBINDEX, TROPONINI in the last 168 hours. BNP (last 3 results) No results for input(s): PROBNP in the last 8760 hours. HbA1C: No results for input(s): HGBA1C in the last 72 hours. CBG: No results for input(s): GLUCAP in the last 168 hours. Lipid Profile: No results for input(s): CHOL, HDL, LDLCALC, TRIG, CHOLHDL, LDLDIRECT in the last 72 hours. Thyroid Function Tests:  Recent Labs  04/30/16 1124  TSH 1.477   Anemia Panel: No results for input(s): VITAMINB12, FOLATE, FERRITIN, TIBC, IRON, RETICCTPCT in the last 72 hours. Urine analysis:    Component Value Date/Time   COLORURINE YELLOW 04/30/2016 1120   APPEARANCEUR CLEAR 04/30/2016 1120   LABSPEC 1.015 04/30/2016 1120   PHURINE 6.0 04/30/2016 1120   GLUCOSEU NEGATIVE 04/30/2016 1120   HGBUR NEGATIVE 04/30/2016 1120   BILIRUBINUR NEGATIVE 04/30/2016 1120   KETONESUR NEGATIVE 04/30/2016 1120   PROTEINUR NEGATIVE 04/30/2016 1120   UROBILINOGEN 0.2 01/29/2014 1850   NITRITE NEGATIVE 04/30/2016 1120   LEUKOCYTESUR NEGATIVE 04/30/2016 1120   Sepsis Labs:  !!!!!!!!!!!!!!!!!!!!!!!!!!!!!!!!!!!!!!!!!!!! @LABRCNTIP (procalcitonin:4,lacticidven:4) )No results found for this or any previous visit (from the past 240 hour(s)).   Radiological Exams on Admission: Dg Chest 2 View  Result Date: 04/30/2016 CLINICAL DATA:  Dizziness and confusion. History dementia, diabetes and hypertension. Former smoker. EXAM: CHEST  2 VIEW COMPARISON:  02/06/2016; 01/29/2016; 05/01/2015; chest CT- 02/06/2016 FINDINGS: Grossly unchanged borderline enlarged cardiac silhouette and mediastinal contours with calcified atherosclerotic plaque within a tortuous thoracic aorta. The lungs are hyperexpanded with flattening of the diaphragms. There is mild diffuse slightly nodular thickening of the pulmonary interstitium, most conspicuous within the peripheral aspect of the right mid lung. No pleural effusion or pneumothorax. No definite evidence of edema. No acute osseous abnormalities. Stigmata of DISH within the thoracic spine. IMPRESSION: 1. Suspected airways disease/bronchitis superimposed on advanced emphysematous change. Note, atypical infection could result in a similar appearance. Clinical correlation is advised. 2.  Emphysema. (ICD10-J43.9) 3.  Aortic Atherosclerosis (ICD10-170.0) Electronically Signed   By: Simonne ComeJohn  Watts M.D.   On: 04/30/2016 12:16   Ct Head Wo Contrast  Result Date: 04/30/2016 CLINICAL DATA:  Confusion and dizziness for 1 day EXAM: CT HEAD WITHOUT CONTRAST TECHNIQUE: Contiguous axial images were obtained from the base of the skull through the vertex without intravenous contrast. COMPARISON:  None. FINDINGS: Brain: There is mild diffuse atrophy. There is no intracranial mass, hemorrhage, extra-axial fluid collection, or midline shift. There is slight small vessel disease in the centra semiovale bilaterally. Gray-white compartments elsewhere appear normal. There is no demonstrable acute infarct. Vascular: There is localized dilatation in the mid the distal M1 segment of  the left middle cerebral artery measuring just over 5 mm, concerning for small aneurysm in this area. There is no hyperdense vessel. There is calcification in the distal left vertebral artery. There is calcification in the carotid siphon regions bilaterally. Skull: The bony calvarium appears intact. Sinuses/Orbits: Visualized paranasal sinuses are clear. Orbits appear symmetric bilaterally. Other: Mastoid air cells are clear. IMPRESSION: Suspect aneurysm in the M1 segment of the left middle cerebral artery measuring slightly greater than 5 mm in diameter. There is no fluid in this area. There is mild atrophy with slight periventricular small vessel disease. No intracranial mass, hemorrhage, or apparent acute infarct. Areas of arterial vascular calcification noted. Electronically Signed   By:  Bretta Bang III M.D.   On: 04/30/2016 12:46   Mr Angiogram Head Wo Contrast  Result Date: 04/30/2016 CLINICAL DATA:  Dizziness beginning earlier today.  Impaired memory. EXAM: MRI HEAD WITHOUT CONTRAST MRA HEAD WITHOUT CONTRAST TECHNIQUE: Multiplanar, multiecho pulse sequences of the brain and surrounding structures were obtained without intravenous contrast. Angiographic images of the head were obtained using MRA technique without contrast. COMPARISON:  CT head earlier today. FINDINGS: MRI HEAD FINDINGS The patient was unable to remain motionless for the exam. Small or subtle lesions could be overlooked. No evidence for acute infarction, hemorrhage, mass lesion, hydrocephalus, or extra-axial fluid. Moderate cerebral and cerebellar atrophy. Minor white matter disease, nonspecific/normal for age. Pituitary, pineal, and cerebellar tonsils unremarkable. No upper cervical lesions. Flow voids are maintained throughout the carotid, basilar, and vertebral arteries. There are no areas of chronic hemorrhage. Visualized calvarium, skull base, and upper cervical osseous structures unremarkable. Scalp and extracranial soft  tissues, orbits, sinuses, and mastoids show no acute process. MRA HEAD FINDINGS The LEFT internal carotid artery is dominant and widely patent. There is no aneurysm in the LEFT anterior circulation. Normal sized LEFT MCA, with no visible aneurysm. Dominant LEFT A1 ACA due to hypoplastic RIGHT A1. Focal stenosis of 75%, possibly greater, supraclinoid RIGHT ICA. Diminutive RIGHT A1 ACA. Normal RIGHT M1 MCA. Moderate irregularity of the BILATERAL M2 and M3 branches, consistent with intracranial atherosclerotic change. Mild non stenotic narrowing of the basilar artery between the AICA and SCA branches. Both vertebrals contribute formation of basilar, mild non stenotic irregularity in the distal V4 segments. Severe disease of the BILATERAL posterior cerebral arteries, 90% stenosis of the distal LEFT P1 segment, and tandem 75% stenoses in the P1 and P2 segments on the RIGHT. No cerebellar branch occlusion. No intracranial aneurysm. Compared with prior CT, the observed density represents the dolichoectatic and prominent ICA terminus. IMPRESSION: No acute intracranial findings.  Chronic changes as described. No visible intracranial aneurysm. 75%, possibly greater, stenosis of the supraclinoid RIGHT ICA. Severe stenoses of the proximal posterior cerebral arteries. Electronically Signed   By: Elsie Stain M.D.   On: 04/30/2016 14:31   Mr Brain Wo Contrast  Result Date: 04/30/2016 CLINICAL DATA:  Dizziness beginning earlier today.  Impaired memory. EXAM: MRI HEAD WITHOUT CONTRAST MRA HEAD WITHOUT CONTRAST TECHNIQUE: Multiplanar, multiecho pulse sequences of the brain and surrounding structures were obtained without intravenous contrast. Angiographic images of the head were obtained using MRA technique without contrast. COMPARISON:  CT head earlier today. FINDINGS: MRI HEAD FINDINGS The patient was unable to remain motionless for the exam. Small or subtle lesions could be overlooked. No evidence for acute infarction,  hemorrhage, mass lesion, hydrocephalus, or extra-axial fluid. Moderate cerebral and cerebellar atrophy. Minor white matter disease, nonspecific/normal for age. Pituitary, pineal, and cerebellar tonsils unremarkable. No upper cervical lesions. Flow voids are maintained throughout the carotid, basilar, and vertebral arteries. There are no areas of chronic hemorrhage. Visualized calvarium, skull base, and upper cervical osseous structures unremarkable. Scalp and extracranial soft tissues, orbits, sinuses, and mastoids show no acute process. MRA HEAD FINDINGS The LEFT internal carotid artery is dominant and widely patent. There is no aneurysm in the LEFT anterior circulation. Normal sized LEFT MCA, with no visible aneurysm. Dominant LEFT A1 ACA due to hypoplastic RIGHT A1. Focal stenosis of 75%, possibly greater, supraclinoid RIGHT ICA. Diminutive RIGHT A1 ACA. Normal RIGHT M1 MCA. Moderate irregularity of the BILATERAL M2 and M3 branches, consistent with intracranial atherosclerotic change. Mild non stenotic narrowing of  the basilar artery between the AICA and SCA branches. Both vertebrals contribute formation of basilar, mild non stenotic irregularity in the distal V4 segments. Severe disease of the BILATERAL posterior cerebral arteries, 90% stenosis of the distal LEFT P1 segment, and tandem 75% stenoses in the P1 and P2 segments on the RIGHT. No cerebellar branch occlusion. No intracranial aneurysm. Compared with prior CT, the observed density represents the dolichoectatic and prominent ICA terminus. IMPRESSION: No acute intracranial findings.  Chronic changes as described. No visible intracranial aneurysm. 75%, possibly greater, stenosis of the supraclinoid RIGHT ICA. Severe stenoses of the proximal posterior cerebral arteries. Electronically Signed   By: Elsie Stain M.D.   On: 04/30/2016 14:31    EKG: Independently reviewed-Lateral and inferior ST and T-wave changes; virtually unchanged from previous  EKG.  Assessment/Plan Principal Problem:   Near syncope Active Problems:   Cardiomyopathy (HCC)   Cerebrovascular disease   Dementia   Diabetes type 2, controlled (HCC)   Thrombocytopenia (HCC)   COPD (chronic obstructive pulmonary disease) (HCC)   Essential hypertension   Normocytic anemia   1. Near syncope. Diagnostic workup in the ED revealed no overt etiology. Although there was evidence of cerebrovascular disease, there was no evidence of an acute infarct.he also has chronic EKG changes, but his initial troponin I is negative. He has no complaints of chest pain or shortness of breath. I suspect, the patient had a vasovagal experience, possibly from orthostatic hypotension when he stood up from sitting coupled with mild volume depletion. He is currently neurologically intact. 2. Coronary artery disease/cardiomyopathy/hypertension. Per review of his EKGs, he has a chronically abnormal EKG with lateral and inferior ST and T-wave abnormalities. Troponin I is negative 1. He denies chest pain. His BNP is elevated, but this is probably from his chronic lung disease and not from CHF. He will be continued on his chronic cardiac medications. 3. COPD with emphysema. His lungs are clear on exam. He has no complaints of shortness of breath. He will be continued on his chronic inhalers. 4. Type 2 diabetes mellitus.his diabetes appears to be diet controlled as he does not report taking any medications for. He also does not check his blood sugars at home. 5. Thrombocytopenia. In review of his chart, he has chronic thrombocytopenia of unknown etiology.his platelet count ranges from 113-128. It is at baseline. His TSH was ordered in the ED and was within normal limits.  6. Normocytic anemia. His baseline hemoglobin ranges from 10.7-12.8. It is currently at baseline. 7. Dementia. 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 He does have some difficulty with remembering his medications, but otherwise, he appears with good cognition and a wit.   Plan: Will start gentle IV fluids. We'll continue his chronic medications. Will order orthostatic vital signs.Will order vitamin B12 level to rule out deficiency. Will cycle his cardiac enzymes.Will monitor him on telemetry.    DVT prophylaxis: SCDs Code Status: Full code Family Communication: Family not available Disposition Plan: Discharge to home when clinically appropriate, likely 05/01/16 Consults called: None Admission status: Observation telemetry   Cia Garretson MD [facility] Pager [phone]  If 7PM-7AM, please contact night-coverage [email or web address] Password [facility]  04/30/2016, 4:27 PM

## 2016-04-30 NOTE — ED Triage Notes (Signed)
Per ems, [place] the bank and became very dizzy.  Pt states he cannot remember anything this morning.  Pt very difficult to assess and denies any complaints currently.  Is alert and oriented to person, place, and time but seems off.

## 2016-04-30 NOTE — ED Provider Notes (Signed)
[facility]-EMERGENCY DEPT Provider Note   CSN: [id number] Arrival date & time: 04/30/16  1031  By signing my name below, I, Modena Jansky, attest that this documentation has been prepared under the direction and in the presence of Linwood Dibbles, MD . Electronically Signed: Modena Jansky, Scribe. 04/30/2016. 10:47 AM.   History   Chief Complaint Chief Complaint  Patient presents with  . Near Syncope   The history is provided by the patient. No language interpreter was used.   HPI Comments: Ryan [name] is a 81 y.o. male brought in by ambulance, who presents to the Emergency Department complaining of lightheadedness that started today. Pt states he had a sudden onset of lightheadedness and SOB [place] [employer], but cannot recall the entire episode due to LOC. He reports that getting up too quickly exacerbates his symptoms. He states that he has prior hx of intermittent SOB since his heart failure a few years ago. He denies any hx of similar episodes, headache, dizziness, chest pain, or blood in stool.     PCP: Nils Pyle, MD  Past Medical History:  Diagnosis Date  . Abnormal ECG   . Cardiomyopathy (HCC)    LVEF 45-50%  . Coronary atherosclerosis of native coronary artery    Based on Myoview - managing medically  . Dementia   . Essential hypertension, benign   . Type 2 diabetes mellitus [facility])     Patient Active Problem List   Diagnosis Date Noted  . Near syncope 04/30/2016  . Hyperlipemia 04/22/2016  . COPD (chronic obstructive pulmonary disease) (HCC) 02/24/2016  . Aneurysm of thoracic aorta (HCC) 02/24/2016  . Essential hypertension, benign 01/16/2016  . Diabetes type 2, controlled (HCC) 05/01/2015  . Exertional dyspnea 05/01/2015  . Acute diastolic heart failure, NYHA class 1 (HCC) 05/01/2015  . Thrombocytopenia (HCC) 05/01/2015  . Coronary atherosclerosis of native coronary artery 10/16/2012  . Dementia 10/16/2012  . EKG, abnormal 07/07/2012    Past  Surgical History:  Procedure Laterality Date  . HERNIA REPAIR    . HIP SURGERY    . KNEE SURGERY    . PROSTATE SURGERY       Home Medications    Prior to Admission medications   Medication Sig Start Date End Date Taking? Authorizing Provider  albuterol (PROVENTIL HFA;VENTOLIN HFA) 108 (90 Base) MCG/ACT inhaler Inhale 2 puffs into the lungs every 6 (six) hours as needed for wheezing or shortness of breath. 01/30/16   [name], FNP  albuterol (PROVENTIL) (2.5 MG/3ML) 0.083% nebulizer solution Take 3 mLs (2.5 mg total) by nebulization every 6 (six) hours as needed for wheezing or shortness of breath. 02/24/16   [name], FNP  Ascorbic Acid (VITAMIN C) 100 MG tablet Take 100 mg by mouth daily.    Historical Provider, MD  aspirin EC 81 MG tablet Take 81 mg by mouth at bedtime.  07/08/12   Osvaldo Shipper, MD  atorvastatin (LIPITOR) 40 MG tablet TAKE ONE TABLET BY MOUTH ONCE DAILY 02/18/16   Elige Radon Dettinger, MD  fluticasone furoate-vilanterol (BREO ELLIPTA) 100-25 MCG/INH AEPB Inhale 1 puff into the lungs daily. 02/24/16   [name], FNP  furosemide (LASIX) 20 MG tablet TAKE ONE TABLET BY MOUTH ONCE DAILY AT [facility] 12/19/15   Antoine Poche, MD  Garlic 1000 MG CAPS Take 3 capsules by mouth every morning.     Historical Provider, MD  Ginger 500 MG CAPS Take by mouth.    Historical Provider, MD  hydrALAZINE (APRESOLINE) 50 MG tablet TAKE ONE TABLET BY MOUTH EVERY 12 HOURS 01/26/16   Jodelle GrossKathryn M Lawrence, NP  isosorbide mononitrate (IMDUR) 30 MG 24 hr tablet Take 0.5 tablets (15 mg total) by mouth daily. 07/11/15   Elige RadonJoshua A Dettinger, MD  potassium chloride (K-DUR) 10 MEQ tablet Take 1 tablet (10 mEq total) by mouth daily. 06/09/15   Jodelle GrossKathryn M Lawrence, NP    Family History Family History  Problem Relation Age of Onset  . Heart attack Mother   . COPD Father     Social History Social History  Substance Use Topics  . Smoking status: Former Smoker    Packs/day: 1.00    Years:  15.00    Types: Cigarettes    Quit date: 01/12/1967  . Smokeless tobacco: Never Used  . Alcohol use No     Allergies   Lisinopril   Review of Systems Review of Systems  Respiratory: Positive for shortness of breath.   Cardiovascular: Negative for chest pain.  Gastrointestinal: Negative for blood in stool.  Neurological: Positive for syncope and light-headedness. Negative for dizziness and headaches.  All other systems reviewed and are negative.    Physical Exam Updated Vital Signs BP 152/82 (BP Location: Right Arm)   Pulse 64   Temp 98 F (36.7 C) (Oral)   Resp 18   Ht 6\' 3"  (1.905 m)   Wt 180 lb (81.6 kg)   SpO2 99%   BMI 22.50 kg/m   Physical Exam  Constitutional: He is oriented to person, place, and time. No distress.  Elderly and frail.   HENT:  Head: Normocephalic and atraumatic.  Right Ear: External ear normal.  Left Ear: External ear normal.  Mouth/Throat: Oropharynx is clear and moist.  Eyes: Conjunctivae are normal. Right eye exhibits no discharge. Left eye exhibits no discharge. No scleral icterus.  Neck: Neck supple. No tracheal deviation present.  Cardiovascular: Normal rate, regular rhythm and intact distal pulses.   Pulmonary/Chest: Effort normal and breath sounds normal. No stridor. No respiratory distress. He has no wheezes. He has no rales.  Abdominal: Soft. Bowel sounds are normal. He exhibits no distension. There is no tenderness. There is no rebound and no guarding.  Musculoskeletal: He exhibits no edema or tenderness.  Neurological: He is alert and oriented to person, place, and time. He has normal strength. No cranial nerve deficit (No facial droop, extraocular movements intact, tongue midline ) or sensory deficit. He exhibits normal muscle tone. He displays no seizure activity. Coordination normal.  No pronator drift bilateral upper extrem, able to hold both legs off bed for 5 seconds, sensation intact in all extremities, no visual field cuts, no  left or right sided neglect, normal finger-nose exam bilaterally, no nystagmus noted   Skin: Skin is warm and dry. No rash noted.  Psychiatric: He has a normal mood and affect.  Nursing note and vitals reviewed.    ED Treatments / Results  DIAGNOSTIC STUDIES: Oxygen Saturation is 99% on RA, normal by my interpretation.    COORDINATION OF CARE: 10:51 AM- Pt advised of plan for treatment, which includes radiology and labs, and pt agrees.  Labs (all labs ordered are listed, but only abnormal results are displayed) Labs Reviewed  CBC - Abnormal; Notable for the following:       Result Value   RBC 3.82 (*)    Hemoglobin 11.8 (*)    HCT 35.7 (*)    Platelets 116 (*)    All other components within  normal limits  COMPREHENSIVE METABOLIC PANEL - Abnormal; Notable for the following:    Glucose, Bld 116 (*)    All other components within normal limits  BRAIN NATRIURETIC PEPTIDE - Abnormal; Notable for the following:    B Natriuretic Peptide 942.0 (*)    All other components within normal limits  PROTIME-INR  APTT  DIFFERENTIAL  URINE RAPID DRUG SCREEN, HOSP PERFORMED  URINALYSIS, ROUTINE W REFLEX MICROSCOPIC (NOT AT [facility])  VITAMIN B12  TSH  HEMOGLOBIN A1C  I-STAT TROPOININ, ED    EKG  EKG Interpretation  Date/Time:  Friday April 30 2016 10:36:53 EDT Ventricular Rate:  67 PR Interval:    QRS Duration: 102 QT Interval:  385 QTC Calculation: 407 R Axis:   64 Text Interpretation:  Sinus rhythm Prolonged PR interval Abnormal R-wave progression, early transition Inferior infarct, age indeterminate Abnrm T, consider ischemia, anterolateral lds No significant change since last tracing Confirmed by Bronson Bressman  MD-J, Daesean Lazarz ([id number]) on 04/30/2016 10:46:26 AM       Radiology Dg Chest 2 View  Result Date: 04/30/2016 CLINICAL DATA:  Dizziness and confusion. History dementia, diabetes and hypertension. Former smoker. EXAM: CHEST  2 VIEW COMPARISON:  02/06/2016; 01/29/2016; 05/01/2015; chest  CT- 02/06/2016 FINDINGS: Grossly unchanged borderline enlarged cardiac silhouette and mediastinal contours with calcified atherosclerotic plaque within a tortuous thoracic aorta. The lungs are hyperexpanded with flattening of the diaphragms. There is mild diffuse slightly nodular thickening of the pulmonary interstitium, most conspicuous within the peripheral aspect of the right mid lung. No pleural effusion or pneumothorax. No definite evidence of edema. No acute osseous abnormalities. Stigmata of DISH within the thoracic spine. IMPRESSION: 1. Suspected airways disease/bronchitis superimposed on advanced emphysematous change. Note, atypical infection could result in a similar appearance. Clinical correlation is advised. 2.  Emphysema. (ICD10-J43.9) 3.  Aortic Atherosclerosis (ICD10-170.0) Electronically Signed   By: Simonne Come M.D.   On: 04/30/2016 12:16   Ct Head Wo Contrast  Result Date: 04/30/2016 CLINICAL DATA:  Confusion and dizziness for 1 day EXAM: CT HEAD WITHOUT CONTRAST TECHNIQUE: Contiguous axial images were obtained from the base of the skull through the vertex without intravenous contrast. COMPARISON:  None. FINDINGS: Brain: There is mild diffuse atrophy. There is no intracranial mass, hemorrhage, extra-axial fluid collection, or midline shift. There is slight small vessel disease in the centra semiovale bilaterally. Gray-white compartments elsewhere appear normal. There is no demonstrable acute infarct. Vascular: There is localized dilatation in the mid the distal M1 segment of the left middle cerebral artery measuring just over 5 mm, concerning for small aneurysm in this area. There is no hyperdense vessel. There is calcification in the distal left vertebral artery. There is calcification in the carotid siphon regions bilaterally. Skull: The bony calvarium appears intact. Sinuses/Orbits: Visualized paranasal sinuses are clear. Orbits appear symmetric bilaterally. Other: Mastoid air cells are  clear. IMPRESSION: Suspect aneurysm in the M1 segment of the left middle cerebral artery measuring slightly greater than 5 mm in diameter. There is no fluid in this area. There is mild atrophy with slight periventricular small vessel disease. No intracranial mass, hemorrhage, or apparent acute infarct. Areas of arterial vascular calcification noted. Electronically Signed   By: Bretta Bang III M.D.   On: 04/30/2016 12:46    Procedures Procedures (including critical care time)  Medications Ordered in ED Medications - No data to display   Initial Impression / Assessment and Plan / ED Course  I have reviewed the triage vital signs and the nursing notes.  Pertinent labs & imaging results that were available during my care of the patient were reviewed by me and considered in my medical decision making (see chart for details).  Clinical Course  Comment By Time  Trop normal Linwood Dibbles, MD 08/18 1148  Imaging reviewed.  Possible aneurysm noted on CT.  Pt is not having a headache.  Doubt this is related to his sx.  Likely will need MRI, MRA Linwood Dibbles, MD 08/18 1306    Pt presents with a near syncopal episode.  Abnormalities noted on labs and imaging but no definite source of his near syncopal sx.  Pt appears table in the ED.  Discussed with Dr Sherrie Mustache.  Will add on MRI , MRA and orthostatics.  Will admit for observation.   Final Clinical Impressions(s) / ED Diagnoses   Final diagnoses:  Near syncope    I personally performed the services described in this documentation, which was scribed in my presence.  The recorded information has been reviewed and is accurate.     Linwood Dibbles, MD 04/30/16 1332

## 2016-04-30 NOTE — Progress Notes (Signed)
Critical lab value received from Lab. Troponin 0.03. K. Sofia midlevel has been notified.

## 2016-05-01 ENCOUNTER — Encounter (HOSPITAL_COMMUNITY): Payer: Self-pay | Admitting: Internal Medicine

## 2016-05-01 DIAGNOSIS — E119 Type 2 diabetes mellitus without complications: Secondary | ICD-10-CM

## 2016-05-01 DIAGNOSIS — I679 Cerebrovascular disease, unspecified: Secondary | ICD-10-CM | POA: Diagnosis not present

## 2016-05-01 DIAGNOSIS — I10 Essential (primary) hypertension: Secondary | ICD-10-CM | POA: Diagnosis not present

## 2016-05-01 DIAGNOSIS — E538 Deficiency of other specified B group vitamins: Secondary | ICD-10-CM | POA: Diagnosis present

## 2016-05-01 DIAGNOSIS — R55 Syncope and collapse: Secondary | ICD-10-CM | POA: Diagnosis not present

## 2016-05-01 HISTORY — DX: Deficiency of other specified B group vitamins: E53.8

## 2016-05-01 LAB — BASIC METABOLIC PANEL
Anion gap: 7 (ref 5–15)
BUN: 17 mg/dL (ref 6–20)
CALCIUM: 8.9 mg/dL (ref 8.9–10.3)
CO2: 26 mmol/L (ref 22–32)
Chloride: 108 mmol/L (ref 101–111)
Creatinine, Ser: 0.9 mg/dL (ref 0.61–1.24)
GFR calc Af Amer: >60 mL/min (ref >60)
GLUCOSE: 105 mg/dL — AB (ref 65–99)
POTASSIUM: 4.2 mmol/L (ref 3.5–5.1)
Sodium: 141 mmol/L (ref 135–145)

## 2016-05-01 LAB — TROPONIN I: TROPONIN I: 0.03 ng/mL — AB (ref <0.03)

## 2016-05-01 LAB — CBC
HEMATOCRIT: 35.4 % — AB (ref 39.0–52.0)
Hemoglobin: 11.6 g/dL — ABNORMAL LOW (ref 13.0–17.0)
MCH: 30.9 pg (ref 26.0–34.0)
MCHC: 32.8 g/dL (ref 30.0–36.0)
MCV: 94.4 fL (ref 78.0–100.0)
Platelets: 128 10*3/uL — ABNORMAL LOW (ref 150–400)
RBC: 3.75 MIL/uL — ABNORMAL LOW (ref 4.22–5.81)
RDW: 14.6 % (ref 11.5–15.5)
WBC: 6.9 10*3/uL (ref 4.0–10.5)

## 2016-05-01 LAB — HEMOGLOBIN A1C
HEMOGLOBIN A1C: 6.3 % — AB (ref 4.8–5.6)
MEAN PLASMA GLUCOSE: 134 mg/dL

## 2016-05-01 LAB — GLUCOSE, CAPILLARY
GLUCOSE-CAPILLARY: 120 mg/dL — AB (ref 65–99)
Glucose-Capillary: 121 mg/dL — ABNORMAL HIGH (ref 65–99)

## 2016-05-01 MED ORDER — VITAMIN B-12 1000 MCG PO TABS: 500.0000 ug | ORAL_TABLET | Freq: Every day | ORAL | Status: DC

## 2016-05-01 MED ORDER — CYANOCOBALAMIN 1000 MCG/ML IJ SOLN
1000.0000 ug | Freq: Once | INTRAMUSCULAR | Status: AC
  Administered 2016-05-01: 1000 ug via INTRAMUSCULAR
  Filled 2016-05-01: qty 1

## 2016-05-01 NOTE — Care Management Obs Status (Signed)
MEDICARE OBSERVATION STATUS NOTIFICATION   Patient Details  Name: Ryan [name] MRN: [id number] Date of Birth: 1934-11-03   Medicare Observation Status Notification Given:  No    Fuller Plan, RN 05/01/2016, 2:44 PM

## 2016-05-01 NOTE — Discharge Summary (Signed)
Physician Discharge Summary  Daegan Arizmendi [facility] UEA:[id number] DOB: 03/12/1935 DOA: 04/30/2016  PCP: Elige Radon Dettinger, MD  Admit date: 04/30/2016 Discharge date: 05/01/2016  Time spent: Greater than 30 minutes  Recommendations for Outpatient Follow-up:  1. Recommend monthly vitamin B12 injections for treatment of vitamin B12 deficiency.      Discharge Diagnoses:  1. Near-syncope, likely from vasovagal experience. 2. Vitamin B12 deficiency, new diagnosis. 3. Long-standing thrombocytopenia, possibly secondary to B12 deficiency. 4. Normocytic anemia. 5. Coronary artery disease. 6. Chronic systolic dysfunction. 7. COPD with emphysema. 8. Diet-controlled type 2 diabetes. 9. Cerebrovascular disease. 10. Dementia, mild.  Discharge Condition: Improved  Diet recommendation: Carbohydrate modified/heart healthy.    Filed Weights   04/30/16 1038 05/01/16 0631  Weight: 81.6 kg (180 lb) 78 kg (172 lb)    History of present illness:  Patient is a 81 y.o. male with medical history significant for CAD, COPD, DM-2, hypertension, thrombocytopenia, and heart failure, who presented with a complaint of lightheadedness and feelings as if he was going to pass out. 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 He reported getting up too quickly which caused him to be lightheaded and having the feeling as if he was going to pass out. He denieed loss of consciousness. He denied associated headache, spinning dizziness, chest pain, palpitations, nausea, or vomiting. In the ED, he was afebrile and hemodynamically stable. His EKG revealed chronic abnormal changes with ST and T-wave abnormalities. His opponent I was negative.his other lab data were significant for a hemoglobin of 11.8, platelet count of 116, BNP of 942,negative urine drug screen, normal urinalysis, and normal TSH. His glucose was 116. His chest x-ray revealed suspected airways disease/bronchitis  superimposed on advanced emphysema. His head CT revealed a suspicion of aneurysm in M1 segment; mild atrophy; and small vessel disease but no acute findings.MRA/MRI of the brain revealed no acute intracranial findings, but with chronic cerebrovascular disease and possibly 75% stenosis of the supraclinoid right ICA and severe stenosis of the proximal posterior cerebral arteries. He was admitted for further evaluation and management.  Hospital Course:  1. Near syncope. Diagnostic workup in the ED revealed no overt etiology. Although there was evidence of cerebrovascular disease, there was no evidence of an acute infarct. He also had chronic EKG changes, but his initial troponin I was negative. He had no complaints of chest pain or shortness of breath. He was restarted on aspirin and his statin. He was started on gentle IV fluids. Additional studies were ordered which yielded a normal TSH and a low vitamin B12 level. He was given a vitamin B12 injection. Orthostatic vital signs were also ordered and were essentially normal. The patient had no complaints of lightheadedness, dizziness, or strokelike symptoms during the hospitalization. He ambulated with the nursing staff very well. I suspect, he had a vasovagal experience, possibly from orthostatic hypotension when he stood up from sitting coupled with mild volume depletion. He is currently neurologically intact.  Vitamin B12 deficiency. Patient's vitamin B12 level was assessed for near-syncope and thrombocytopenia. It was low at 145. He was given 1000 g of vitamin B-12 IM 1. He will  need further treatment with monthly injections. This will be deferred to his PCP.   Coronary artery disease/cardiomyopathy/hypertension. Patient was restarted on aspirin, Lipitor, hydralazine, Imdur and Lasix. Per review of his EKGs, he has a chronically abnormal EKG with lateral and inferior ST and T-wave abnormalities. Troponin I was negative 1 in  the ED, but the follow-up  troponin I was marginally elevated at 0.03 and remained flat. Marland Kitchen He denied chest pain. His BNP was elevated, but this was probably from his chronic lung disease and not from CHF.  COPD with emphysema. His lungs were clear on exam. He had no complaints of shortness of breath. His oxygen saturation on room air was in the 90s. He will be continued on his chronic inhalers.  Type 2 diabetes mellitus. His diabetes appears his diet controlled as he did not report taking any medications for. He also does not check his blood sugars at home. He was started on sliding scale NovoLog during the hospital course, but his blood glucose was near normal.  Thrombocytopenia. In review of his chart, he has chronic thrombocytopenia of unknown etiology. His platelet count ranges from 113-128. It is at baseline. His TSH was within normal limits. His vitamin B12 level was low. This could be the possible etiology of his thrombocytopenia. Recommend periodic monitoring of his platelet count.   Normocytic anemia. His baseline hemoglobin ranges from 10.7-12.8. It was at baseline.  Dementia. 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 He does have some difficulty with remembering his medications, but otherwise, he appeared with good cognition and a wit.  Procedures:  None  Consultations:  None  Discharge Exam: Vitals:   05/01/16 0905 05/01/16 0908  BP: (!) 101/53 (!) 116/55  Pulse: 66 65  Resp:    Temp:    Oxygen saturation 99% on room air. Respiratory rate 18.  General: Pleasant alert 81 year old man man in no acute distress. Cardiovascular: S1, S2, no murmurs rubs or gallops. Respiratory: Clear to auscultation bilaterally. Neurologic: He is alert and oriented 3. Cranial nerves II through XII are intact. Ambulation with the nurse revealed no ataxia or any other abnormalities.  Discharge Instructions   Discharge  Instructions    Diet - low sodium heart healthy    Complete by:  As directed   Discharge instructions    Complete by:  As directed   Stand slowly. Avoid going long periods of time without eating or drinking. You will need to talk to your doctor/provider about monthly vitamin  B12 injections.   Increase activity slowly    Complete by:  As directed     Current Discharge Medication List    START taking these medications   Details  vitamin B-12 (CYANOCOBALAMIN) 1000 MCG tablet Take 0.5 tablets (500 mcg total) by mouth daily.      CONTINUE these medications which have NOT CHANGED   Details  albuterol (PROVENTIL HFA;VENTOLIN HFA) 108 (90 Base) MCG/ACT inhaler Inhale 2 puffs into the lungs every 6 (six) hours as needed for wheezing or shortness of breath. Qty: 1 Inhaler, Refills: 2    Ascorbic Acid (VITAMIN C) 100 MG tablet Take 100 mg by mouth daily.    aspirin EC 81 MG tablet Take 81 mg by mouth every other day.     atorvastatin (LIPITOR) 40 MG tablet TAKE ONE TABLET BY MOUTH ONCE DAILY Qty: 90 tablet, Refills: 0    fluticasone furoate-vilanterol (BREO ELLIPTA) 100-25 MCG/INH AEPB Inhale 1 puff into the lungs daily. Qty: 60 each, Refills: 3   Associated Diagnoses: Pulmonary emphysema, unspecified emphysema type (HCC); SOB (shortness of breath); Thoracic aortic aneurysm without rupture (HCC)    furosemide (LASIX) 20 MG tablet TAKE ONE TABLET BY MOUTH ONCE DAILY AT  NOON Qty: 30 tablet, Refills: 6    Garlic 1000 MG CAPS Take 3 capsules by mouth every morning.     Ginger 500 MG CAPS Take 1 capsule by mouth daily.     hydrALAZINE (APRESOLINE) 50 MG tablet TAKE ONE TABLET BY MOUTH EVERY 12 HOURS Qty: 60 tablet, Refills: 11    isosorbide mononitrate (IMDUR) 30 MG 24 hr tablet Take 0.5 tablets (15 mg total) by mouth daily. Qty: 30 tablet, Refills: 6    potassium chloride (K-DUR) 10 MEQ tablet Take 1 tablet (10 mEq total) by mouth daily. Qty: 90 tablet, Refills: 3    albuterol  (PROVENTIL) (2.5 MG/3ML) 0.083% nebulizer solution Take 3 mLs (2.5 mg total) by nebulization every 6 (six) hours as needed for wheezing or shortness of breath. Qty: 150 mL, Refills: 1   Associated Diagnoses: Pulmonary emphysema, unspecified emphysema type (HCC); SOB (shortness of breath); Thoracic aortic aneurysm without rupture (HCC)       Allergies  Allergen Reactions  . Lisinopril Cough   Follow-up Information    Jannifer Rodney, FNP Follow up in 2 week(s).   Specialty:  Family Medicine Contact information: [address] [place] [address] [phone]            The results of significant diagnostics from this hospitalization (including imaging, microbiology, ancillary and laboratory) are listed below for reference.    Significant Diagnostic Studies: Dg Chest 2 View  Result Date: 04/30/2016 CLINICAL DATA:  Dizziness and confusion. History dementia, diabetes and hypertension. Former smoker. EXAM: CHEST  2 VIEW COMPARISON:  02/06/2016; 01/29/2016; 05/01/2015; chest CT- 02/06/2016 FINDINGS: Grossly unchanged borderline enlarged cardiac silhouette and mediastinal contours with calcified atherosclerotic plaque within a tortuous thoracic aorta. The lungs are hyperexpanded with flattening of the diaphragms. There is mild diffuse slightly nodular thickening of the pulmonary interstitium, most conspicuous within the peripheral aspect of the right mid lung. No pleural effusion or pneumothorax. No definite evidence of edema. No acute osseous abnormalities. Stigmata of DISH within the thoracic spine. IMPRESSION: 1. Suspected airways disease/bronchitis superimposed on advanced emphysematous change. Note, atypical infection could result in a similar appearance. Clinical correlation is advised. 2.  Emphysema. (ICD10-J43.9) 3.  Aortic Atherosclerosis (ICD10-170.0) Electronically Signed   By: Simonne Come M.D.   On: 04/30/2016 12:16   Ct Head Wo Contrast  Result Date: 04/30/2016 CLINICAL  DATA:  Confusion and dizziness for 1 day EXAM: CT HEAD WITHOUT CONTRAST TECHNIQUE: Contiguous axial images were obtained from the base of the skull through the vertex without intravenous contrast. COMPARISON:  None. FINDINGS: Brain: There is mild diffuse atrophy. There is no intracranial mass, hemorrhage, extra-axial fluid collection, or midline shift. There is slight small vessel disease in the centra semiovale bilaterally. Gray-white compartments elsewhere appear normal. There is no demonstrable acute infarct. Vascular: There is localized dilatation in the mid the distal M1 segment of the left middle cerebral artery measuring just over 5 mm, concerning for small aneurysm in this area. There is no hyperdense vessel. There is calcification in the distal left vertebral artery. There is calcification in the carotid siphon regions bilaterally. Skull: The bony calvarium appears intact. Sinuses/Orbits: Visualized paranasal sinuses are clear. Orbits appear symmetric bilaterally. Other: Mastoid air cells are clear. IMPRESSION: Suspect aneurysm in the M1 segment of the left middle cerebral artery measuring slightly greater than 5 mm in diameter. There is no fluid in this area. There is mild atrophy with slight periventricular small vessel disease. No intracranial mass, hemorrhage, or apparent acute infarct.  Areas of arterial vascular calcification noted. Electronically Signed   By: Bretta Bang III M.D.   On: 04/30/2016 12:46   Mr Angiogram Head Wo Contrast  Result Date: 04/30/2016 CLINICAL DATA:  Dizziness beginning earlier today.  Impaired memory. EXAM: MRI HEAD WITHOUT CONTRAST MRA HEAD WITHOUT CONTRAST TECHNIQUE: Multiplanar, multiecho pulse sequences of the brain and surrounding structures were obtained without intravenous contrast. Angiographic images of the head were obtained using MRA technique without contrast. COMPARISON:  CT head earlier today. FINDINGS: MRI HEAD FINDINGS The patient was unable to remain  motionless for the exam. Small or subtle lesions could be overlooked. No evidence for acute infarction, hemorrhage, mass lesion, hydrocephalus, or extra-axial fluid. Moderate cerebral and cerebellar atrophy. Minor white matter disease, nonspecific/normal for age. Pituitary, pineal, and cerebellar tonsils unremarkable. No upper cervical lesions. Flow voids are maintained throughout the carotid, basilar, and vertebral arteries. There are no areas of chronic hemorrhage. Visualized calvarium, skull base, and upper cervical osseous structures unremarkable. Scalp and extracranial soft tissues, orbits, sinuses, and mastoids show no acute process. MRA HEAD FINDINGS The LEFT internal carotid artery is dominant and widely patent. There is no aneurysm in the LEFT anterior circulation. Normal sized LEFT MCA, with no visible aneurysm. Dominant LEFT A1 ACA due to hypoplastic RIGHT A1. Focal stenosis of 75%, possibly greater, supraclinoid RIGHT ICA. Diminutive RIGHT A1 ACA. Normal RIGHT M1 MCA. Moderate irregularity of the BILATERAL M2 and M3 branches, consistent with intracranial atherosclerotic change. Mild non stenotic narrowing of the basilar artery between the AICA and SCA branches. Both vertebrals contribute formation of basilar, mild non stenotic irregularity in the distal V4 segments. Severe disease of the BILATERAL posterior cerebral arteries, 90% stenosis of the distal LEFT P1 segment, and tandem 75% stenoses in the P1 and P2 segments on the RIGHT. No cerebellar branch occlusion. No intracranial aneurysm. Compared with prior CT, the observed density represents the dolichoectatic and prominent ICA terminus. IMPRESSION: No acute intracranial findings.  Chronic changes as described. No visible intracranial aneurysm. 75%, possibly greater, stenosis of the supraclinoid RIGHT ICA. Severe stenoses of the proximal posterior cerebral arteries. Electronically Signed   By: Elsie Stain M.D.   On: 04/30/2016 14:31   Mr Brain Wo  Contrast  Result Date: 04/30/2016 CLINICAL DATA:  Dizziness beginning earlier today.  Impaired memory. EXAM: MRI HEAD WITHOUT CONTRAST MRA HEAD WITHOUT CONTRAST TECHNIQUE: Multiplanar, multiecho pulse sequences of the brain and surrounding structures were obtained without intravenous contrast. Angiographic images of the head were obtained using MRA technique without contrast. COMPARISON:  CT head earlier today. FINDINGS: MRI HEAD FINDINGS The patient was unable to remain motionless for the exam. Small or subtle lesions could be overlooked. No evidence for acute infarction, hemorrhage, mass lesion, hydrocephalus, or extra-axial fluid. Moderate cerebral and cerebellar atrophy. Minor white matter disease, nonspecific/normal for age. Pituitary, pineal, and cerebellar tonsils unremarkable. No upper cervical lesions. Flow voids are maintained throughout the carotid, basilar, and vertebral arteries. There are no areas of chronic hemorrhage. Visualized calvarium, skull base, and upper cervical osseous structures unremarkable. Scalp and extracranial soft tissues, orbits, sinuses, and mastoids show no acute process. MRA HEAD FINDINGS The LEFT internal carotid artery is dominant and widely patent. There is no aneurysm in the LEFT anterior circulation. Normal sized LEFT MCA, with no visible aneurysm. Dominant LEFT A1 ACA due to hypoplastic RIGHT A1. Focal stenosis of 75%, possibly greater, supraclinoid RIGHT ICA. Diminutive RIGHT A1 ACA. Normal RIGHT M1 MCA. Moderate irregularity of the BILATERAL M2 and M3  branches, consistent with intracranial atherosclerotic change. Mild non stenotic narrowing of the basilar artery between the AICA and SCA branches. Both vertebrals contribute formation of basilar, mild non stenotic irregularity in the distal V4 segments. Severe disease of the BILATERAL posterior cerebral arteries, 90% stenosis of the distal LEFT P1 segment, and tandem 75% stenoses in the P1 and P2 segments on the RIGHT. No  cerebellar branch occlusion. No intracranial aneurysm. Compared with prior CT, the observed density represents the dolichoectatic and prominent ICA terminus. IMPRESSION: No acute intracranial findings.  Chronic changes as described. No visible intracranial aneurysm. 75%, possibly greater, stenosis of the supraclinoid RIGHT ICA. Severe stenoses of the proximal posterior cerebral arteries. Electronically Signed   By: Elsie StainJohn T Curnes M.D.   On: 04/30/2016 14:31    Microbiology: No results found for this or any previous visit (from the past 240 hour(s)).   Labs: Basic Metabolic Panel:  Recent Labs Lab 04/30/16 1124 05/01/16 0607  NA 138 141  K 3.9 4.2  CL 107 108  CO2 26 26  GLUCOSE 116* 105*  BUN 19 17  CREATININE 0.98 0.90  CALCIUM 9.1 8.9   Liver Function Tests:  Recent Labs Lab 04/30/16 1124  AST 22  ALT 18  ALKPHOS 67  BILITOT 1.0  PROT 6.8  ALBUMIN 3.6   No results for input(s): LIPASE, AMYLASE in the last 168 hours. No results for input(s): AMMONIA in the last 168 hours. CBC:  Recent Labs Lab 04/30/16 1124 05/01/16 0607  WBC 7.2 6.9  NEUTROABS 4.3  --   HGB 11.8* 11.6*  HCT 35.7* 35.4*  MCV 93.5 94.4  PLT 116* 128*   Cardiac Enzymes:  Recent Labs Lab 04/30/16 1855 05/01/16 0607  TROPONINI 0.03* 0.03*   BNP: BNP (last 3 results)  Recent Labs  04/30/16 1124  BNP 942.0*    ProBNP (last 3 results) No results for input(s): PROBNP in the last 8760 hours.  CBG:  Recent Labs Lab 04/30/16 2058 05/01/16 0735 05/01/16 1131  GLUCAP 166* 121* 120*       Signed:  Shawntia Mangal MD.  [facility] 05/01/2016, 1:45 PM

## 2016-05-01 NOTE — Progress Notes (Signed)
Discharge instructions read to patient.  Patient verbalized understanding of all.  Discharged to home with self

## 2016-05-04 ENCOUNTER — Telehealth: Payer: Self-pay | Admitting: Family

## 2016-05-04 NOTE — Telephone Encounter (Signed)
1,000 mcg B12 is usual dose once a month.

## 2016-05-21 DIAGNOSIS — H01001 Unspecified blepharitis right upper eyelid: Secondary | ICD-10-CM | POA: Diagnosis not present

## 2016-05-21 DIAGNOSIS — H01004 Unspecified blepharitis left upper eyelid: Secondary | ICD-10-CM | POA: Diagnosis not present

## 2016-05-21 DIAGNOSIS — H532 Diplopia: Secondary | ICD-10-CM | POA: Diagnosis not present

## 2016-05-21 DIAGNOSIS — H5201 Hypermetropia, right eye: Secondary | ICD-10-CM | POA: Diagnosis not present

## 2016-05-21 DIAGNOSIS — H01002 Unspecified blepharitis right lower eyelid: Secondary | ICD-10-CM | POA: Diagnosis not present

## 2016-05-27 ENCOUNTER — Encounter: Payer: Self-pay | Admitting: Family

## 2016-05-27 ENCOUNTER — Ambulatory Visit (INDEPENDENT_AMBULATORY_CARE_PROVIDER_SITE_OTHER): Payer: Medicare Other | Admitting: Family

## 2016-05-27 VITALS — BP 121/58 | HR 58 | Temp 97.0°F | Ht 75.0 in | Wt 184.0 lb

## 2016-05-27 DIAGNOSIS — Z23 Encounter for immunization: Secondary | ICD-10-CM

## 2016-05-27 DIAGNOSIS — Z09 Encounter for follow-up examination after completed treatment for conditions other than malignant neoplasm: Secondary | ICD-10-CM

## 2016-05-27 DIAGNOSIS — D649 Anemia, unspecified: Secondary | ICD-10-CM

## 2016-05-27 DIAGNOSIS — R55 Syncope and collapse: Secondary | ICD-10-CM

## 2016-05-27 DIAGNOSIS — E538 Deficiency of other specified B group vitamins: Secondary | ICD-10-CM

## 2016-05-27 MED ORDER — PNEUMOCOCCAL 13-VAL CONJ VACC IM SUSP: 0.5000 mL | INTRAMUSCULAR | Status: DC

## 2016-05-27 MED ORDER — PNEUMOCOCCAL 13-VAL CONJ VACC IM SUSP
0.5000 mL | Freq: Once | INTRAMUSCULAR | Status: AC
  Administered 2016-05-27: 0.5 mL via INTRAMUSCULAR

## 2016-05-27 MED ORDER — CYANOCOBALAMIN 1000 MCG/ML IJ SOLN
1000.0000 ug | INTRAMUSCULAR | Status: DC
  Administered 2016-06-02 – 2016-07-29 (×2): 1000 ug via INTRAMUSCULAR

## 2016-05-27 NOTE — Patient Instructions (Signed)

## 2016-05-27 NOTE — Progress Notes (Signed)
   Subjective:    Patient ID: Ryan [name], male    DOB: 1935/08/20, 81 y.o.   MRN: [id number]  HPI Pt presents to the office today for hospital follow up. Pt went to the ED on 04/30/16 and was discharged 04/29/16 for a near syncope episode that was likely from vasovagal experience. PT was also diagnosed with B12 deficiency and normocytic anemia and was recommending that he receive injections.   [facility] notes reviewed.   Review of Systems  Constitutional: Negative for fatigue.  Respiratory: Positive for shortness of breath ("if I do too much").   Cardiovascular: Negative.  Negative for chest pain, palpitations and leg swelling.  Psychiatric/Behavioral: Positive for confusion and decreased concentration.       Objective:   Physical Exam  Constitutional: He is oriented to person, place, and time. He appears well-developed and well-nourished. No distress.  HENT:  Head: Normocephalic.  Right Ear: External ear normal.  Left Ear: External ear normal.  Nose: Nose normal.  Mouth/Throat: Oropharynx is clear and moist.  Eyes: Pupils are equal, round, and reactive to light. Right eye exhibits no discharge. Left eye exhibits no discharge.  Neck: Normal range of motion. Neck supple. No thyromegaly present.  Cardiovascular: Normal rate, regular rhythm, normal heart sounds and intact distal pulses.   No murmur heard. Pulmonary/Chest: Effort normal. No respiratory distress. He has no wheezes.  Diminished breath sounds  Abdominal: Soft. Bowel sounds are normal. He exhibits no distension. There is no tenderness.  Musculoskeletal: Normal range of motion. He exhibits no edema or tenderness.  Neurological: He is alert and oriented to person, place, and time.  Skin: Skin is warm and dry. No rash noted. No erythema.  Psychiatric: He has a normal mood and affect. His behavior is normal. Judgment and thought content normal.  Vitals reviewed.     BP (!) 121/58 (BP Location: Left Arm, Patient  Position: Sitting, Cuff Size: Normal)   Pulse (!) 58   Temp 97 F (36.1 C) (Oral)   Ht 6\' 3"  (1.905 m)   Wt 184 lb (83.5 kg)   BMI 23.00 kg/m      Assessment & Plan:  1. Near syncope  2. Vitamin B12 deficiency -Pt to start on Vit B 12 - cyanocobalamin ((VITAMIN B-12)) injection 1,000 mcg; Inject 1 mL (1,000 mcg total) into the muscle every 30 (thirty) days.  3. Normocytic anemia - cyanocobalamin ((VITAMIN B-12)) injection 1,000 mcg; Inject 1 mL (1,000 mcg total) into the muscle every 30 (thirty) days.  4. Hospital discharge follow-up  5. Need for prophylactic vaccination against single diseases - pneumococcal 13-valent conjugate vaccine (PREVNAR 13) injection 0.5 mL; Inject 0.5 mLs into the muscle once.    Continue all meds Labs pending Health Maintenance reviewed Diet and exercise encouraged RTO as needed  And keep chronic follow up appt with PCP  Jannifer Rodney, FNP

## 2016-06-02 ENCOUNTER — Ambulatory Visit (INDEPENDENT_AMBULATORY_CARE_PROVIDER_SITE_OTHER): Payer: Medicare Other | Admitting: *Deleted

## 2016-06-02 DIAGNOSIS — D649 Anemia, unspecified: Secondary | ICD-10-CM | POA: Diagnosis not present

## 2016-06-02 DIAGNOSIS — E538 Deficiency of other specified B group vitamins: Secondary | ICD-10-CM | POA: Diagnosis not present

## 2016-06-02 NOTE — Progress Notes (Signed)
Cyanocobalamin 1016mcg/ml given in left thigh per patients request. Pt tolerated well

## 2016-06-03 ENCOUNTER — Ambulatory Visit: Payer: Medicare Other | Admitting: Family

## 2016-06-04 ENCOUNTER — Ambulatory Visit: Payer: Medicare Other | Admitting: Family

## 2016-06-17 DIAGNOSIS — H524 Presbyopia: Secondary | ICD-10-CM | POA: Diagnosis not present

## 2016-06-17 DIAGNOSIS — H01001 Unspecified blepharitis right upper eyelid: Secondary | ICD-10-CM | POA: Diagnosis not present

## 2016-06-17 DIAGNOSIS — H01002 Unspecified blepharitis right lower eyelid: Secondary | ICD-10-CM | POA: Diagnosis not present

## 2016-06-17 DIAGNOSIS — H01005 Unspecified blepharitis left lower eyelid: Secondary | ICD-10-CM | POA: Diagnosis not present

## 2016-06-17 DIAGNOSIS — H01004 Unspecified blepharitis left upper eyelid: Secondary | ICD-10-CM | POA: Diagnosis not present

## 2016-06-23 DIAGNOSIS — H5005 Alternating esotropia: Secondary | ICD-10-CM | POA: Diagnosis not present

## 2016-06-29 ENCOUNTER — Encounter (HOSPITAL_COMMUNITY): Payer: Self-pay | Admitting: Emergency Medicine

## 2016-06-29 ENCOUNTER — Emergency Department (HOSPITAL_COMMUNITY): Payer: Medicare Other

## 2016-06-29 ENCOUNTER — Ambulatory Visit (INDEPENDENT_AMBULATORY_CARE_PROVIDER_SITE_OTHER): Payer: Medicare Other | Admitting: Family

## 2016-06-29 ENCOUNTER — Emergency Department (HOSPITAL_COMMUNITY)
Admission: EM | Admit: 2016-06-29 | Discharge: 2016-06-29 | Disposition: A | Discharge status: Home / Self Care | Payer: Medicare Other | Attending: Emergency Medicine | Admitting: Emergency Medicine

## 2016-06-29 ENCOUNTER — Encounter: Payer: Self-pay | Admitting: Family

## 2016-06-29 ENCOUNTER — Ambulatory Visit: Payer: Medicare Other

## 2016-06-29 ENCOUNTER — Ambulatory Visit (INDEPENDENT_AMBULATORY_CARE_PROVIDER_SITE_OTHER): Payer: Medicare Other

## 2016-06-29 VITALS — BP 130/66 | HR 64 | Temp 96.9°F | Resp 22 | Ht 75.0 in | Wt 182.0 lb

## 2016-06-29 DIAGNOSIS — Z87891 Personal history of nicotine dependence: Secondary | ICD-10-CM | POA: Insufficient documentation

## 2016-06-29 DIAGNOSIS — I10 Essential (primary) hypertension: Secondary | ICD-10-CM | POA: Diagnosis not present

## 2016-06-29 DIAGNOSIS — R05 Cough: Secondary | ICD-10-CM

## 2016-06-29 DIAGNOSIS — R0602 Shortness of breath: Secondary | ICD-10-CM | POA: Diagnosis not present

## 2016-06-29 DIAGNOSIS — E119 Type 2 diabetes mellitus without complications: Secondary | ICD-10-CM | POA: Insufficient documentation

## 2016-06-29 DIAGNOSIS — R0789 Other chest pain: Secondary | ICD-10-CM | POA: Insufficient documentation

## 2016-06-29 DIAGNOSIS — J449 Chronic obstructive pulmonary disease, unspecified: Secondary | ICD-10-CM | POA: Diagnosis not present

## 2016-06-29 DIAGNOSIS — Z791 Long term (current) use of non-steroidal anti-inflammatories (NSAID): Secondary | ICD-10-CM | POA: Insufficient documentation

## 2016-06-29 DIAGNOSIS — Z79899 Other long term (current) drug therapy: Secondary | ICD-10-CM | POA: Insufficient documentation

## 2016-06-29 DIAGNOSIS — I2510 Atherosclerotic heart disease of native coronary artery without angina pectoris: Secondary | ICD-10-CM | POA: Insufficient documentation

## 2016-06-29 DIAGNOSIS — R059 Cough, unspecified: Secondary | ICD-10-CM

## 2016-06-29 DIAGNOSIS — Z7982 Long term (current) use of aspirin: Secondary | ICD-10-CM | POA: Insufficient documentation

## 2016-06-29 DIAGNOSIS — R079 Chest pain, unspecified: Secondary | ICD-10-CM | POA: Diagnosis present

## 2016-06-29 LAB — CBC
HEMATOCRIT: 35.4 % — AB (ref 39.0–52.0)
Hemoglobin: 11.7 g/dL — ABNORMAL LOW (ref 13.0–17.0)
MCH: 31.2 pg (ref 26.0–34.0)
MCHC: 33.1 g/dL (ref 30.0–36.0)
MCV: 94.4 fL (ref 78.0–100.0)
Platelets: 109 10*3/uL — ABNORMAL LOW (ref 150–400)
RBC: 3.75 MIL/uL — AB (ref 4.22–5.81)
RDW: 13.5 % (ref 11.5–15.5)
WBC: 7.1 10*3/uL (ref 4.0–10.5)

## 2016-06-29 LAB — BASIC METABOLIC PANEL
Anion gap: 8 (ref 5–15)
BUN: 20 mg/dL (ref 6–20)
CHLORIDE: 106 mmol/L (ref 101–111)
CO2: 24 mmol/L (ref 22–32)
Calcium: 9.2 mg/dL (ref 8.9–10.3)
Creatinine, Ser: 0.93 mg/dL (ref 0.61–1.24)
GFR calc Af Amer: >60 mL/min (ref >60)
GFR calc non Af Amer: >60 mL/min (ref >60)
Glucose, Bld: 127 mg/dL — ABNORMAL HIGH (ref 65–99)
POTASSIUM: 3.9 mmol/L (ref 3.5–5.1)
SODIUM: 138 mmol/L (ref 135–145)

## 2016-06-29 LAB — I-STAT TROPONIN, ED: Troponin i, poc: 0 ng/mL (ref 0.00–0.08)

## 2016-06-29 LAB — TROPONIN I

## 2016-06-29 LAB — BRAIN NATRIURETIC PEPTIDE: B NATRIURETIC PEPTIDE 5: 779 pg/mL — AB (ref 0.0–100.0)

## 2016-06-29 MED ORDER — NITROGLYCERIN 0.4 MG SL SUBL: 0.4000 mg | SUBLINGUAL_TABLET | SUBLINGUAL | Status: DC | PRN

## 2016-06-29 MED ORDER — ASPIRIN 81 MG PO CHEW
324.0000 mg | CHEWABLE_TABLET | Freq: Once | ORAL | Status: AC
  Administered 2016-06-29: 324 mg via ORAL
  Filled 2016-06-29: qty 4

## 2016-06-29 NOTE — Progress Notes (Signed)
   Subjective:    Patient ID: Ryan [name], male    DOB: 11-11-1934, 81 y.o.   MRN: [id number]  Chest Pain   This is a new problem. The current episode started yesterday. The onset quality is gradual. The problem occurs constantly. The problem has been unchanged. The pain is present in the lateral region (bilateral). The pain is at a severity of 7/10. The pain is mild. The quality of the pain is described as burning. The pain does not radiate. Associated symptoms include a cough and vomiting. Pertinent negatives include no back pain, fever or shortness of breath. The cough is productive (at times). Color change with cough: brown. The pain is aggravated by coughing. He has tried nothing for the symptoms. The treatment provided no relief.  His past medical history is significant for CAD and MI.      Review of Systems  Constitutional: Negative for fever.  Respiratory: Positive for cough. Negative for shortness of breath.   Cardiovascular: Positive for chest pain.  Gastrointestinal: Positive for vomiting.  Musculoskeletal: Negative for back pain.  All other systems reviewed and are negative.      Objective:   Physical Exam  Constitutional: He is oriented to person, place, and time. He appears well-developed and well-nourished. No distress.  HENT:  Head: Normocephalic.  Right Ear: External ear normal.  Left Ear: External ear normal.  Mouth/Throat: Oropharynx is clear and moist.  Eyes: Pupils are equal, round, and reactive to light. Right eye exhibits no discharge. Left eye exhibits no discharge.  Neck: Normal range of motion. Neck supple. No thyromegaly present.  Cardiovascular: Normal rate, regular rhythm, normal heart sounds and intact distal pulses.   No murmur heard. Pulmonary/Chest: Effort normal. No respiratory distress. He has no wheezes.  Diminished breath sounds, nonproductive cough   Abdominal: Soft. Bowel sounds are normal. He exhibits no distension. There is no  tenderness.  Musculoskeletal: Normal range of motion. He exhibits no edema or tenderness.  Neurological: He is alert and oriented to person, place, and time. He has normal reflexes. No cranial nerve deficit.  Skin: Skin is warm and dry. No rash noted. No erythema.  Psychiatric: He has a normal mood and affect. His behavior is normal. Judgment and thought content normal.  Vitals reviewed.     BP 130/66 (BP Location: Left Arm, Patient Position: Sitting, Cuff Size: Normal)   Pulse 64   Temp (!) 96.9 F (36.1 C) (Oral)   Resp (!) 22   Ht 6\' 3"  (1.905 m)   Wt 182 lb (82.6 kg)   SpO2 99%   BMI 22.75 kg/m      Assessment & Plan:  1. Other chest pain - EKG 12-Lead - DG Chest 2 View; Future  2. Cough - DG Chest 2 View; Future  Pt having chest pain and has had a hx of MI in past per pt. Pt has slight changes in EKG from previous, but since having constant chest pain I would like him to be evaluated. PT states he does not want to go via ambulance, but wants to drive. PT seems stable enough to drive, but did discuss risks. PT states he will drive. Encouraged pt multiple times to go straight to [facility], but copy of EKG given to patient if he were to go to other ED.    Jannifer Rodney, FNP

## 2016-06-29 NOTE — ED Triage Notes (Signed)
Pt sent by [facility] for eval of chest pain. Pt states chest pain started yesterday, centralized chest. Pt denies diaphoresis or n/v. Pt states the pain is sharp "but sometimes it just hurts."

## 2016-06-29 NOTE — ED Provider Notes (Signed)
[facility] Provider Note   CSN: [id number] Arrival date & time: 06/29/16  1258  By signing my name below, I, Placido Sou, attest that this documentation has been prepared under the direction and in the presence of Loren Racer, MD. Electronically Signed: Placido Sou, ED Scribe. 06/29/16. 1:43 PM.  History   Chief Complaint Chief Complaint  Patient presents with  . Chest Pain   HPI HPI Comments: Ryan [name] is a 81 y.o. male with a significant medical history listed below who presents to the Emergency Department complaining of constant, centralized CP onset yesterday morning. He states sometimes his pain is a burning sensation and other times it "just hurts a little bit but not that bad". His pain intermittently worsens with deep breathing. He rates his current pain as a 4/10 and states his pain was a 7/10 this morning. He reports intermittent cough and intermittent fever (TMAX 100 F 2 days ago and 99 F last night). He also reports SOB with exertion but denies this is a new symptom.   The history is provided by the patient. No language interpreter was used.   Past Medical History:  Diagnosis Date  . Abnormal ECG   . Cardiomyopathy (HCC)    LVEF 45-50%  . Coronary atherosclerosis of native coronary artery    Based on Myoview - managing medically  . Dementia   . Essential hypertension, benign   . Type 2 diabetes mellitus (HCC)   . Vitamin B12 deficiency 05/01/2016    Patient Active Problem List   Diagnosis Date Noted  . Vitamin B12 deficiency 05/01/2016  . Near syncope 04/30/2016  . Essential hypertension 04/30/2016  . Cardiomyopathy (HCC) 04/30/2016  . Normocytic anemia 04/30/2016  . Cerebrovascular disease 04/30/2016  . Hyperlipemia 04/22/2016  . COPD (chronic obstructive pulmonary disease) (HCC) 02/24/2016  . Aneurysm of thoracic aorta (HCC) 02/24/2016  . Essential hypertension, benign 01/16/2016  . Diabetes type 2, controlled (HCC)  05/01/2015  . Exertional dyspnea 05/01/2015  . Acute diastolic heart failure, NYHA class 1 (HCC) 05/01/2015  . Thrombocytopenia (HCC) 05/01/2015  . Coronary atherosclerosis of native coronary artery 10/16/2012  . Dementia 10/16/2012  . EKG, abnormal 07/07/2012    Past Surgical History:  Procedure Laterality Date  . HERNIA REPAIR    . HIP SURGERY    . KNEE SURGERY    . PROSTATE SURGERY       Home Medications    Prior to Admission medications   Medication Sig Start Date End Date Taking? Authorizing Provider  albuterol (PROVENTIL HFA;VENTOLIN HFA) 108 (90 Base) MCG/ACT inhaler Inhale 2 puffs into the lungs every 6 (six) hours as needed for wheezing or shortness of breath. 01/30/16  Yes [name], FNP  Ascorbic Acid (VITAMIN C) 100 MG tablet Take 100 mg by mouth daily.   Yes Historical Provider, MD  aspirin EC 81 MG tablet Take 81 mg by mouth every other day.  07/08/12  Yes Osvaldo Shipper, MD  atorvastatin (LIPITOR) 40 MG tablet TAKE ONE TABLET BY MOUTH ONCE DAILY 02/18/16  Yes Elige Radon Dettinger, MD  furosemide (LASIX) 20 MG tablet TAKE ONE TABLET BY MOUTH ONCE DAILY AT NOON 12/19/15  Yes Antoine Poche, MD  Garlic 1000 MG CAPS Take 3 capsules by mouth every morning.    Yes Historical Provider, MD  Ginger 500 MG CAPS Take 1 capsule by mouth daily.    Yes Historical Provider, MD  hydrALAZINE (APRESOLINE) 50 MG tablet TAKE ONE TABLET BY MOUTH EVERY 12  HOURS 01/26/16  Yes Jodelle GrossKathryn M Lawrence, NP  ibuprofen (ADVIL,MOTRIN) 200 MG tablet Take 400 mg by mouth every 6 (six) hours as needed for moderate pain.   Yes Historical Provider, MD  isosorbide mononitrate (IMDUR) 30 MG 24 hr tablet Take 0.5 tablets (15 mg total) by mouth daily. 07/11/15  Yes Elige RadonJoshua A Dettinger, MD  potassium chloride (K-DUR) 10 MEQ tablet Take 1 tablet (10 mEq total) by mouth daily. 06/09/15  Yes Jodelle GrossKathryn M Lawrence, NP  albuterol (PROVENTIL) (2.5 MG/3ML) 0.083% nebulizer solution Take 3 mLs (2.5 mg total) by nebulization  every 6 (six) hours as needed for wheezing or shortness of breath. Patient not taking: Reported on 06/29/2016 02/24/16   Junie Spencerhristy A Hawks, FNP  fluticasone furoate-vilanterol (BREO ELLIPTA) 100-25 MCG/INH AEPB Inhale 1 puff into the lungs daily. Patient not taking: Reported on 06/29/2016 02/24/16   Junie Spencerhristy A Hawks, FNP  vitamin B-12 (CYANOCOBALAMIN) 1000 MCG tablet Take 0.5 tablets (500 mcg total) by mouth daily. Patient taking differently: Take 1,000 mcg by mouth 3 (three) times daily.  05/01/16   Elliot Cousinenise Fisher, MD    Family History Family History  Problem Relation Age of Onset  . Heart attack Mother   . COPD Father     Social History Social History  Substance Use Topics  . Smoking status: Former Smoker    Packs/day: 1.00    Years: 15.00    Types: Cigarettes    Quit date: 01/12/1967  . Smokeless tobacco: Never Used  . Alcohol use No     Allergies   Lisinopril  Review of Systems Review of Systems  Constitutional: Positive for fever. Negative for chills.  Respiratory: Positive for cough and shortness of breath. Negative for chest tightness and wheezing.   Cardiovascular: Positive for chest pain. Negative for palpitations and leg swelling.  Gastrointestinal: Negative for abdominal pain, diarrhea, nausea and vomiting.  Genitourinary: Negative for dysuria, flank pain, frequency and hematuria.  Musculoskeletal: Negative for back pain, myalgias, neck pain and neck stiffness.  Neurological: Negative for dizziness, syncope, speech difficulty, weakness, light-headedness, numbness and headaches.  All other systems reviewed and are negative.  Physical Exam Updated Vital Signs BP 118/62   Pulse 70   Temp 98 F (36.7 C) (Oral)   Resp 18   Ht 6\' 3"  (1.905 m)   Wt 182 lb (82.6 kg)   SpO2 98%   BMI 22.75 kg/m   Physical Exam  Constitutional: He is oriented to person, place, and time. He appears well-developed and well-nourished. No distress.  HENT:  Head: Normocephalic and  atraumatic.  Mouth/Throat: Oropharynx is clear and moist.  Eyes: EOM are normal. Pupils are equal, round, and reactive to light.  Neck: Normal range of motion. Neck supple. No JVD present.  Cardiovascular: Normal rate and regular rhythm.  Exam reveals no gallop and no friction rub.   No murmur heard. Pulmonary/Chest: Effort normal and breath sounds normal. No respiratory distress. He has no wheezes. He has no rales. He exhibits no tenderness.  Abdominal: Soft. Bowel sounds are normal. There is no tenderness. There is no rebound and no guarding.  Musculoskeletal: Normal range of motion. He exhibits no edema or tenderness.  No lower extremity swelling, asymmetry or tenderness. Distal pulses are equal.  Neurological: He is alert and oriented to person, place, and time.  Moves all extremities without deficit. Sensation is fully intact.  Skin: Skin is warm and dry. Capillary refill takes less than 2 seconds. No rash noted. No erythema.  Psychiatric: He  has a normal mood and affect. His behavior is normal.  Nursing note and vitals reviewed.   ED Treatments / Results  Labs (all labs ordered are listed, but only abnormal results are displayed) Labs Reviewed  BASIC METABOLIC PANEL - Abnormal; Notable for the following:       Result Value   Glucose, Bld 127 (*)    All other components within normal limits  CBC - Abnormal; Notable for the following:    RBC 3.75 (*)    Hemoglobin 11.7 (*)    HCT 35.4 (*)    Platelets 109 (*)    All other components within normal limits  BRAIN NATRIURETIC PEPTIDE - Abnormal; Notable for the following:    B Natriuretic Peptide 779.0 (*)    All other components within normal limits  TROPONIN I  I-STAT TROPOININ, ED    EKG  EKG Interpretation  Date/Time:  Tuesday June 29 2016 13:08:22 EDT Ventricular Rate:  69 PR Interval:    QRS Duration: 105 QT Interval:  388 QTC Calculation: 416 R Axis:   64 Text Interpretation:  Sinus rhythm Inferior infarct,  age indeterminate Lateral leads are also involved No significant change was found Confirmed by CAMPOS  MD, Caryn Bee ([id number]) on 06/29/2016 1:16:53 PM       Radiology No results found.  Procedures Procedures  DIAGNOSTIC STUDIES: Oxygen Saturation is 98% on RA, normal by my interpretation.    COORDINATION OF CARE: 1:36 PM Discussed next steps with pt. Pt verbalized understanding and is agreeable with the plan.    Medications Ordered in ED Medications  aspirin chewable tablet 324 mg (324 mg Oral Given 06/29/16 1427)     Initial Impression / Assessment and Plan / ED Course  I have reviewed the triage vital signs and the nursing notes.  Pertinent labs & imaging results that were available during my care of the patient were reviewed by me and considered in my medical decision making (see chart for details).  Clinical Course  Patient has had constant chest pain since yesterday morning. He is in no distress. Had chest x-ray performed as an outpatient without acute abnormality. Patient state his chest pain has completely improved. Abnormal EKG but appears unchanged from previous. Initial troponin is normal. Low suspicion for PE. Repeat troponin is normal. Patient's chest pain completely resolved. We'll discharge home to follow-up with his primary physician and cardiologist. Return precautions given. I personally performed the services described in this documentation, which was scribed in my presence. The recorded information has been reviewed and is accurate.    Final Clinical Impressions(s) / ED Diagnoses   Final diagnoses:  Atypical chest pain    New Prescriptions Discharge Medication List as of 06/29/2016  5:50 PM       Loren Racer, MD 07/02/16 2326

## 2016-06-29 NOTE — Patient Instructions (Signed)

## 2016-07-13 ENCOUNTER — Other Ambulatory Visit: Payer: Self-pay | Admitting: Cardiology

## 2016-07-16 ENCOUNTER — Other Ambulatory Visit: Payer: Self-pay | Admitting: Adult Health

## 2016-07-19 NOTE — Progress Notes (Signed)
Cardiology Office Note  Date: 07/20/2016   ID: Ryan [name] 12/23/1934, MRN [id number]  PCP: Ryan Rodney, FNP  Primary Cardiologist: Ryan Dell, MD   Chief Complaint  Patient presents with  . Coronary Artery Disease  . Cardiomyopathy    History of Present Illness: Ryan [name] is a 81 y.o. male last seen in May. Interval records reviewed, he was seen recently in the ER with chest pain. He had 2 negative troponin I levels, nonacute chest x-ray. He presents today stating that he has had no further symptoms.  We have been managing him conservatively with a history of ischemic heart disease and cardiomyopathy. I reviewed his medications which are outlined below. Current cardiac regimen includes aspirin, Lipitor, Lasix, hydralazine, Imdur, and potassium supplements.  Echocardiogram from last year is outlined below, LVEF 45-50% range. His chest x-ray during ER visit did not show any pulmonary edema.  Past Medical History:  Diagnosis Date  . Abnormal ECG   . Cardiomyopathy (HCC)    LVEF 45-50%  . Coronary atherosclerosis of native coronary artery    Based on Myoview - managing medically  . Dementia   . Essential hypertension, benign   . Type 2 diabetes mellitus (HCC)   . Vitamin B12 deficiency 05/01/2016    Past Surgical History:  Procedure Laterality Date  . HERNIA REPAIR    . HIP SURGERY    . KNEE SURGERY    . PROSTATE SURGERY      Current Outpatient Prescriptions  Medication Sig Dispense Refill  . albuterol (PROVENTIL HFA;VENTOLIN HFA) 108 (90 Base) MCG/ACT inhaler Inhale 2 puffs into the lungs every 6 (six) hours as needed for wheezing or shortness of breath. 1 Inhaler 2  . albuterol (PROVENTIL) (2.5 MG/3ML) 0.083% nebulizer solution Take 3 mLs (2.5 mg total) by nebulization every 6 (six) hours as needed for wheezing or shortness of breath. 150 mL 1  . Ascorbic Acid (VITAMIN C) 100 MG tablet Take 100 mg by mouth daily.    Marland Kitchen aspirin EC 81 MG  tablet Take 81 mg by mouth every other day.     Marland Kitchen atorvastatin (LIPITOR) 40 MG tablet TAKE ONE TABLET BY MOUTH ONCE DAILY 90 tablet 0  . fluticasone furoate-vilanterol (BREO ELLIPTA) 100-25 MCG/INH AEPB Inhale 1 puff into the lungs daily. 60 each 3  . furosemide (LASIX) 20 MG tablet TAKE ONE TABLET BY MOUTH ONCE DAILY AT  NOON 30 tablet 6  . Garlic 1000 MG CAPS Take 3 capsules by mouth every morning.     . Ginger 500 MG CAPS Take 1 capsule by mouth daily.     . hydrALAZINE (APRESOLINE) 50 MG tablet TAKE ONE TABLET BY MOUTH EVERY 12 HOURS 60 tablet 11  . ibuprofen (ADVIL,MOTRIN) 200 MG tablet Take 400 mg by mouth every 6 (six) hours as needed for moderate pain.    . isosorbide mononitrate (IMDUR) 30 MG 24 hr tablet Take 0.5 tablets (15 mg total) by mouth daily. 30 tablet 6  . potassium chloride (K-DUR) 10 MEQ tablet TAKE ONE TABLET BY MOUTH ONCE DAILY 90 tablet 3  . vitamin B-12 (CYANOCOBALAMIN) 1000 MCG tablet Take 0.5 tablets (500 mcg total) by mouth daily. (Patient taking differently: Take 1,000 mcg by mouth 3 (three) times daily. )     Current Facility-Administered Medications  Medication Dose Route Frequency Provider Last Rate Last Dose  . cyanocobalamin ((VITAMIN B-12)) injection 1,000 mcg  1,000 mcg Intramuscular Q30 days [name], FNP   1,000  mcg at 06/02/16 1105   Allergies:  Lisinopril   Social History: The patient  reports that he quit smoking about 49 years ago. His smoking use included Cigarettes. He has a 15.00 pack-year smoking history. He has never used smokeless tobacco. He reports that he does not drink alcohol or use drugs.   ROS:  Please see the history of present illness. Otherwise, complete review of systems is positive for gait unsteadiness.  All other systems are reviewed and negative.   Physical Exam: VS:  BP 128/63   Pulse (!) 59   Ht 6\' 3"  (1.905 m)   Wt 184 lb (83.5 kg)   SpO2 96%   BMI 23.00 kg/m , BMI Body mass index is 23 kg/m.  Wt Readings from  Last 3 Encounters:  07/20/16 184 lb (83.5 kg)  06/29/16 182 lb (82.6 kg)  06/29/16 182 lb (82.6 kg)    Elderly male in no distress, using a cane. HEENT: Conjunctiva and lids normal, oropharynx clear.  Neck: Supple, no elevated JVP or carotid bruits, no thyromegaly.  Lungs: Clear to auscultation, nonlabored breathing at rest.  Cardiac: Regular rate and rhythm, no S3, soft systolic murmur at apex, no diastolic murmur, no pericardial rub.  Abdomen: Soft, nontender, bowel sounds present.  Extremities: No pitting edema, distal pulses 2+.  Skin: Warm and dry.  Musculoskeletal: No kyphosis.  Neuropsychiatric: Alert and oriented x3, somewhat unusual affect.  ECG: I personally reviewed the tracing from 04/30/2016 which showed sinus rhythm with prolonged PR interval, old inferolateral infarct pattern.  Recent Labwork: 04/30/2016: ALT 18; AST 22; TSH 1.477 06/29/2016: B Natriuretic Peptide 779.0; BUN 20; Creatinine, Ser 0.93; Hemoglobin 11.7; Platelets 109; Potassium 3.9; Sodium 138     Component Value Date/Time   CHOL 122 04/22/2016 1119   TRIG 169 (H) 04/22/2016 1119   TRIG 210 (H) 06/24/2014 1424   HDL 50 04/22/2016 1119   HDL 45 06/24/2014 1424   CHOLHDL 2.4 04/22/2016 1119   CHOLHDL 3.0 07/08/2012 0551   VLDL 22 07/08/2012 0551   LDLCALC 38 04/22/2016 1119   LDLCALC 72 06/24/2014 1424   Other Studies Reviewed Today:  Lexiscan Cardiolite 08/21/2012: IMPRESSION:  Abnormal but relatively low risk Lexiscan Myoview as outlined. Baseline ECG shows ST-segment abnormalities, nondiagnostic accentuation noted with stress, no arrhythmias noted. Perfusion imaging is consistent with ischemia at the inferior apex, probable soft tissue attenuation affecting the anteroseptal apex. LVEF is calculated at 65%.  Echocardiogram 05/02/2015: Study Conclusions  - Left ventricle: Technically difficult study. There is hypokinesis  of base inferior segment. Inferolateral wall difficult to  assess.  There is hypokinesis of this wall. The EF is 45-50% The cavity  size was normal. Wall thickness was increased in a pattern of  mild LVH. - Mitral valve: Calcified annulus. There was no significant  regurgitation. - Right ventricle: The cavity size was normal. Systolic function  was normal.  Chest x-ray 06/29/2016: FINDINGS: Heart size is normal. There is aortic calcification. The lungs show chronic emphysema and scarring. No sign of active infiltrate, mass, effusion or collapse. Chronic degenerative changes affect spine.  IMPRESSION: Chronic emphysema and pulmonary scarring. No evidence of active infiltrate or collapse.  Assessment and Plan:  1. Ischemic heart disease based on prior Myoview study indicating inferior apical ischemia. Plan is to continue conservative medical therapy. Recent ER evaluation noted and reviewed. No evidence of ACS at that time. Continue with present regimen.  2. Cardiomyopathy with LVEF 45-50% range. No evidence of volume overload. Recent chest  x-ray without edema. He continues on Lasix.  3. Essential hypertension, blood pressure is adequately controlled today.  4. Type 2 diabetes mellitus, follows with PCP. Hemoglobin A1c was 6.3 back in August.  Current medicines were reviewed with the patient today.  Disposition: Follow-up in 6 months.  Signed, Jonelle Sidle, MD, [facility] 07/20/2016 9:55 AM    [facility] at [facility] [address] [place], [place], [place] [address] Phone: [phone]; Fax: [phone]

## 2016-07-20 ENCOUNTER — Ambulatory Visit (INDEPENDENT_AMBULATORY_CARE_PROVIDER_SITE_OTHER): Payer: Medicare Other | Admitting: Cardiology

## 2016-07-20 ENCOUNTER — Encounter: Payer: Self-pay | Admitting: Cardiology

## 2016-07-20 VITALS — BP 128/63 | HR 59 | Ht 75.0 in | Wt 184.0 lb

## 2016-07-20 DIAGNOSIS — I25119 Atherosclerotic heart disease of native coronary artery with unspecified angina pectoris: Secondary | ICD-10-CM | POA: Diagnosis not present

## 2016-07-20 DIAGNOSIS — I10 Essential (primary) hypertension: Secondary | ICD-10-CM

## 2016-07-20 DIAGNOSIS — E119 Type 2 diabetes mellitus without complications: Secondary | ICD-10-CM

## 2016-07-20 DIAGNOSIS — I429 Cardiomyopathy, unspecified: Secondary | ICD-10-CM

## 2016-07-20 NOTE — Patient Instructions (Signed)

## 2016-07-29 ENCOUNTER — Encounter: Payer: Self-pay | Admitting: Family

## 2016-07-29 ENCOUNTER — Ambulatory Visit (INDEPENDENT_AMBULATORY_CARE_PROVIDER_SITE_OTHER): Payer: Medicare Other | Admitting: Family

## 2016-07-29 VITALS — BP 135/68 | HR 58 | Temp 96.8°F | Ht 73.0 in | Wt 180.2 lb

## 2016-07-29 DIAGNOSIS — E538 Deficiency of other specified B group vitamins: Secondary | ICD-10-CM | POA: Diagnosis not present

## 2016-07-29 DIAGNOSIS — I10 Essential (primary) hypertension: Secondary | ICD-10-CM

## 2016-07-29 DIAGNOSIS — E119 Type 2 diabetes mellitus without complications: Secondary | ICD-10-CM

## 2016-07-29 DIAGNOSIS — D649 Anemia, unspecified: Secondary | ICD-10-CM | POA: Diagnosis not present

## 2016-07-29 DIAGNOSIS — J439 Emphysema, unspecified: Secondary | ICD-10-CM | POA: Diagnosis not present

## 2016-07-29 DIAGNOSIS — G309 Alzheimer's disease, unspecified: Secondary | ICD-10-CM

## 2016-07-29 DIAGNOSIS — I5031 Acute diastolic (congestive) heart failure: Secondary | ICD-10-CM | POA: Diagnosis not present

## 2016-07-29 DIAGNOSIS — I429 Cardiomyopathy, unspecified: Secondary | ICD-10-CM

## 2016-07-29 DIAGNOSIS — E785 Hyperlipidemia, unspecified: Secondary | ICD-10-CM

## 2016-07-29 DIAGNOSIS — F0280 Dementia in other diseases classified elsewhere without behavioral disturbance: Secondary | ICD-10-CM | POA: Diagnosis not present

## 2016-07-29 NOTE — Patient Instructions (Signed)
Chronic Obstructive Pulmonary Disease Chronic obstructive pulmonary disease (COPD) is a common lung problem. In COPD, the flow of air from the lungs is limited. The way your lungs work will probably never return to normal, but there are things you can do to improve your lungs and make yourself feel better. Your doctor may treat your condition with:  Medicines.  Oxygen.  Lung surgery.  Changes to your diet.  Rehabilitation. This may involve a team of specialists. Follow these instructions at home:  Take all medicines as told by your doctor.  Avoid medicines or cough syrups that dry up your airway (such as antihistamines) and do not allow you to get rid of thick spit. You do not need to avoid them if told differently by your doctor.  If you smoke, stop. Smoking makes the problem worse.  Avoid being around things that make your breathing worse (like smoke, chemicals, and fumes).  Use oxygen therapy and therapy to help improve your lungs (pulmonary rehabilitation) if told by your doctor. If you need home oxygen therapy, ask your doctor if you should buy a tool to measure your oxygen level (oximeter).  Avoid people who have a sickness you can catch (contagious).  Avoid going outside when it is very hot, cold, or humid.  Eat healthy foods. Eat smaller meals more often. Rest before meals.  Stay active, but remember to also rest.  Make sure to get all the shots (vaccines) your doctor recommends. Ask your doctor if you need a pneumonia shot.  Learn and use tips on how to relax.  Learn and use tips on how to control your breathing as told by your doctor. Try: 1. Breathing in (inhaling) through your nose for 1 second. Then, pucker your lips and breath out (exhale) through your lips for 2 seconds. 2. Putting one hand on your belly (abdomen). Breathe in slowly through your nose for 1 second. Your hand on your belly should move out. Pucker your lips and breathe out slowly through your lips.  Your hand on your belly should move in as you breathe out.  Learn and use controlled coughing to clear thick spit from your lungs. The steps are: 1. Lean your head a little forward. 2. Breathe in deeply. 3. Try to hold your breath for 3 seconds. 4. Keep your mouth slightly open while coughing 2 times. 5. Spit any thick spit out into a tissue. 6. Rest and do the steps again 1 or 2 times as needed. Contact a doctor if:  You cough up more thick spit than usual.  There is a change in the color or thickness of the spit.  It is harder to breathe than usual.  Your breathing is faster than usual. Get help right away if:  You have shortness of breath while resting.  You have shortness of breath that stops you from:  Being able to talk.  Doing normal activities.  You chest hurts for longer than 5 minutes.  Your skin color is more blue than usual.  Your pulse oximeter shows that you have low oxygen for longer than 5 minutes. This information is not intended to replace advice given to you by your health care provider. Make sure you discuss any questions you have with your health care provider. Document Released: 02/16/2008 Document Revised: 02/05/2016 Document Reviewed: 04/26/2013 Elsevier Interactive Patient Education  2017 [employer]  

## 2016-07-29 NOTE — Progress Notes (Signed)
Subjective:    Patient ID: Ryan [name], male    DOB: 1935-08-11, 81 y.o.   MRN: [id number]  Pt presents to the office today for chronic follow up. PT is a poor historian and has dementia. Pt states this has been going on for the last few years, but seem worse now. Per EPIC pt has just seen Cardiol[facility]ists on 07/20/16 with stable CAD and cardiomyopathy with LVEF 45-50%. Diabetes  He presents for his follow-up diabetic visit. He has type 2 diabetes mellitus. His disease course has been fluctuating. There are no hypoglycemic associated symptoms. Pertinent negatives for hypoglycemia include no headaches. Associated symptoms include blurred vision and visual change. Pertinent negatives for diabetes include no foot paresthesias and no foot ulcerations. There are no hypoglycemic complications. Symptoms are worsening. Diabetic complications include heart disease. Pertinent negatives for diabetic complications include no CVA, nephropathy or peripheral neuropathy. Risk factors for coronary artery disease include diabetes mellitus, dyslipidemia, male sex and hypertension. Current diabetic treatment includes diet. His weight is stable. He is following a generally healthy diet. (Does not check BS at home ) An ACE inhibitor/angiotensin II receptor blocker is not being taken. Eye exam is current.  Hypertension  This is a chronic problem. The current episode started more than 1 year ago. The problem has been resolved since onset. The problem is controlled. Associated symptoms include blurred vision, malaise/fatigue and shortness of breath. Pertinent negatives include no headaches, palpitations or peripheral edema. Past treatments include diuretics. The current treatment provides moderate improvement. Hypertensive end-organ damage includes CAD/MI and heart failure. There is no history of kidney disease or CVA.  Hyperlipidemia  This is a chronic problem. The current episode started more than 1 year ago. The problem  is controlled. Recent lipid tests were reviewed and are normal. Exacerbating diseases include diabetes. Associated symptoms include shortness of breath. Current antihyperlipidemic treatment includes statins. The current treatment provides moderate improvement of lipids. Risk factors for coronary artery disease include diabetes mellitus, dyslipidemia, male sex and hypertension.  Anemia  Presents for follow-up visit. Symptoms include bruises/bleeds easily and malaise/fatigue. There has been no light-headedness or palpitations. Signs of blood loss that are not present include hematochezia and melena. Past medical history includes heart failure.  COPD PT currently taking Breo and albuterol as needed. Discussed that he needs to take [facility] every day.    Review of Systems  Constitutional: Positive for malaise/fatigue.  Eyes: Positive for blurred vision.  Respiratory: Positive for cough and shortness of breath.   Cardiovascular: Negative for palpitations.  Gastrointestinal: Negative for hematochezia and melena.  Neurological: Negative for light-headedness and headaches.  Hematological: Bruises/bleeds easily.  All other systems reviewed and are negative.      Objective:   Physical Exam  Constitutional: He is oriented to person, place, and time. He appears well-developed and well-nourished. No distress.  HENT:  Head: Normocephalic.  Right Ear: External ear normal.  Left Ear: External ear normal.  Nose: Nose normal.  Mouth/Throat: Oropharynx is clear and moist.  Eyes: Pupils are equal, round, and reactive to light. Right eye exhibits no discharge. Left eye exhibits no discharge.  Neck: Normal range of motion. Neck supple. No thyromegaly present.  Cardiovascular: Normal rate, regular rhythm, normal heart sounds and intact distal pulses.   No murmur heard. Pulmonary/Chest: Effort normal and breath sounds normal. No respiratory distress. He has no wheezes.  Diminished breath sounds   Abdominal:  Soft. Bowel sounds are normal. He exhibits no distension. There is no tenderness.  Musculoskeletal: Normal range of motion. He exhibits no edema or tenderness.  Walking with cane   Neurological: He is alert and oriented to person, place, and time.  Skin: Skin is warm and dry. No rash noted. No erythema.  Psychiatric: He has a normal mood and affect. His behavior is normal. Judgment and thought content normal.  Vitals reviewed.      BP 135/68   Pulse (!) 58   Temp (!) 96.8 F (36 C) (Oral)   Ht _0  (1.854 m)   Wt 180 lb 3.2 oz (81.7 kg)   BMI 23.77 kg/m   Assessment & Plan:  1. Acute diastolic heart failure, NYHA class 1 (HCC) - CMP14+EGFR  2. Cardiomyopathy, unspecified type ([place]) - CMP14+EGFR  3. Essential hypertension, benign - CMP14+EGFR  4. Pulmonary emphysema, unspecified emphysema type (HCC) - CMP14+EGFR  5. Controlled type 2 diabetes mellitus without complication, without long-term current use of insulin (HCC) - CMP14+EGFR  6. Alzheimer's dementia without behavioral disturbance, unspecified timing of dementia onset - CMP14+EGFR  7. Hyperlipidemia, unspecified hyperlipidemia type - CMP14+EGFR - Lipid panel  8. Vitamin B12 deficiency - CMP14+EGFR  9. Normocytic anemia - Anemia Profile B - CMP14+EGFR   Continue all meds, keep all appts with Cardiologists and Opthamologist Labs pending Health Maintenance reviewed Diet and exercise encouraged RTO 3 months  Evelina Dun, FNP

## 2016-07-30 LAB — ANEMIA PROFILE B
BASOS: 0 %
Basophils Absolute: 0 10*3/uL (ref 0.0–0.2)
EOS (ABSOLUTE): 0.1 10*3/uL (ref 0.0–0.4)
EOS: 1 %
FERRITIN: 132 ng/mL (ref 30–400)
Folate: 10.8 ng/mL (ref >3.0)
HEMATOCRIT: 35.6 % — AB (ref 37.5–51.0)
HEMOGLOBIN: 12.1 g/dL — AB (ref 12.6–17.7)
IRON SATURATION: 25 % (ref 15–55)
Immature Grans (Abs): 0 10*3/uL (ref 0.0–0.1)
Immature Granulocytes: 0 %
Iron: 64 ug/dL (ref 38–169)
Lymphocytes Absolute: 2.3 10*3/uL (ref 0.7–3.1)
Lymphs: 33 %
MCH: 30.9 pg (ref 26.6–33.0)
MCHC: 34 g/dL (ref 31.5–35.7)
MCV: 91 fL (ref 79–97)
MONOS ABS: 0.5 10*3/uL (ref 0.1–0.9)
Monocytes: 8 %
NEUTROS ABS: 4 10*3/uL (ref 1.4–7.0)
Neutrophils: 58 %
Platelets: 145 10*3/uL — ABNORMAL LOW (ref 150–379)
RBC: 3.91 x10E6/uL — AB (ref 4.14–5.80)
RDW: 14.3 % (ref 12.3–15.4)
RETIC CT PCT: 1 % (ref 0.6–2.6)
Total Iron Binding Capacity: 257 ug/dL (ref 250–450)
UIBC: 193 ug/dL (ref 111–343)
WBC: 6.9 10*3/uL (ref 3.4–10.8)

## 2016-07-30 LAB — CMP14+EGFR
A/G RATIO: 1.5 (ref 1.2–2.2)
ALT: 12 IU/L (ref 0–44)
AST: 14 IU/L (ref 0–40)
Albumin: 4 g/dL (ref 3.5–4.8)
Alkaline Phosphatase: 74 IU/L (ref 39–117)
BILIRUBIN TOTAL: 0.6 mg/dL (ref 0.0–1.2)
BUN/Creatinine Ratio: 20 (ref 10–24)
BUN: 21 mg/dL (ref 8–27)
CALCIUM: 9.5 mg/dL (ref 8.6–10.2)
CHLORIDE: 104 mmol/L (ref 96–106)
CO2: 27 mmol/L (ref 18–29)
Creatinine, Ser: 1.06 mg/dL (ref 0.76–1.27)
GFR, EST AFRICAN AMERICAN: 77 mL/min/{1.73_m2} (ref >59)
GFR, EST NON AFRICAN AMERICAN: 66 mL/min/{1.73_m2} (ref >59)
GLOBULIN, TOTAL: 2.7 g/dL (ref 1.5–4.5)
Glucose: 130 mg/dL — ABNORMAL HIGH (ref 65–99)
POTASSIUM: 4.4 mmol/L (ref 3.5–5.2)
SODIUM: 144 mmol/L (ref 134–144)
TOTAL PROTEIN: 6.7 g/dL (ref 6.0–8.5)

## 2016-07-30 LAB — LIPID PANEL
Chol/HDL Ratio: 2.8 ratio (ref 0.0–5.0)
Cholesterol, Total: 122 mg/dL (ref 100–199)
HDL: 43 mg/dL
LDL Calculated: 33 mg/dL (ref 0–99)
Triglycerides: 230 mg/dL — ABNORMAL HIGH (ref 0–149)
VLDL Cholesterol Cal: 46 mg/dL — ABNORMAL HIGH (ref 5–40)

## 2016-08-02 DIAGNOSIS — H2511 Age-related nuclear cataract, right eye: Secondary | ICD-10-CM | POA: Diagnosis not present

## 2016-08-03 ENCOUNTER — Telehealth: Payer: Self-pay | Admitting: *Deleted

## 2016-08-03 NOTE — Telephone Encounter (Signed)
Pt notified of results

## 2016-08-03 NOTE — Telephone Encounter (Signed)
-----   Message from [name], FNP sent at 07/30/2016  2:29 PM EST ----- Vit B12 elevated- Pt can stop B 12 injections CBC stable Kidney and liver function stable LDL normal, Triglycerides elevated- Pt needs to be on low fat diet and exercise

## 2016-08-13 HISTORY — PX: EYE SURGERY: SHX253

## 2016-08-16 DIAGNOSIS — H2511 Age-related nuclear cataract, right eye: Secondary | ICD-10-CM | POA: Diagnosis not present

## 2016-08-16 DIAGNOSIS — H25811 Combined forms of age-related cataract, right eye: Secondary | ICD-10-CM | POA: Diagnosis not present

## 2016-08-24 ENCOUNTER — Other Ambulatory Visit: Payer: Self-pay | Admitting: Family Medicine

## 2016-08-24 DIAGNOSIS — H2512 Age-related nuclear cataract, left eye: Secondary | ICD-10-CM | POA: Diagnosis not present

## 2016-08-30 DIAGNOSIS — H2512 Age-related nuclear cataract, left eye: Secondary | ICD-10-CM | POA: Diagnosis not present

## 2016-08-30 DIAGNOSIS — H25812 Combined forms of age-related cataract, left eye: Secondary | ICD-10-CM | POA: Diagnosis not present

## 2016-09-22 ENCOUNTER — Other Ambulatory Visit: Payer: Self-pay | Admitting: Family Medicine

## 2016-10-28 DIAGNOSIS — Z961 Presence of intraocular lens: Secondary | ICD-10-CM | POA: Diagnosis not present

## 2016-10-28 DIAGNOSIS — H5005 Alternating esotropia: Secondary | ICD-10-CM | POA: Diagnosis not present

## 2016-11-02 ENCOUNTER — Encounter: Payer: Self-pay | Admitting: Family

## 2016-11-02 ENCOUNTER — Ambulatory Visit (INDEPENDENT_AMBULATORY_CARE_PROVIDER_SITE_OTHER): Payer: Medicare Other | Admitting: Family

## 2016-11-02 VITALS — BP 125/64 | HR 60 | Temp 97.2°F | Ht 73.0 in | Wt 179.2 lb

## 2016-11-02 DIAGNOSIS — I10 Essential (primary) hypertension: Secondary | ICD-10-CM | POA: Diagnosis not present

## 2016-11-02 DIAGNOSIS — G309 Alzheimer's disease, unspecified: Secondary | ICD-10-CM | POA: Diagnosis not present

## 2016-11-02 DIAGNOSIS — I25119 Atherosclerotic heart disease of native coronary artery with unspecified angina pectoris: Secondary | ICD-10-CM

## 2016-11-02 DIAGNOSIS — E119 Type 2 diabetes mellitus without complications: Secondary | ICD-10-CM | POA: Diagnosis not present

## 2016-11-02 DIAGNOSIS — F0280 Dementia in other diseases classified elsewhere without behavioral disturbance: Secondary | ICD-10-CM

## 2016-11-02 DIAGNOSIS — I429 Cardiomyopathy, unspecified: Secondary | ICD-10-CM

## 2016-11-02 DIAGNOSIS — D649 Anemia, unspecified: Secondary | ICD-10-CM | POA: Diagnosis not present

## 2016-11-02 DIAGNOSIS — E785 Hyperlipidemia, unspecified: Secondary | ICD-10-CM

## 2016-11-02 DIAGNOSIS — I5031 Acute diastolic (congestive) heart failure: Secondary | ICD-10-CM | POA: Diagnosis not present

## 2016-11-02 DIAGNOSIS — J439 Emphysema, unspecified: Secondary | ICD-10-CM

## 2016-11-02 LAB — BAYER DCA HB A1C WAIVED: HB A1C: 6 % (ref <7.0)

## 2016-11-02 MED ORDER — MEMANTINE HCL-DONEPEZIL HCL 7 & 14 & 21 &28 -10 MG PO C4PK: 1.0000 | EXTENDED_RELEASE_CAPSULE | Freq: Every day | ORAL | 0 refills | Status: DC

## 2016-11-02 MED ORDER — MEMANTINE HCL-DONEPEZIL HCL ER 28-10 MG PO CP24: 1.0000 | ORAL_CAPSULE | Freq: Every day | ORAL | 1 refills | Status: DC

## 2016-11-02 NOTE — Patient Instructions (Signed)
Fall Prevention in the Home Introduction Falls can cause injuries. They can happen to people of all ages. There are many things you can do to make your home safe and to help prevent falls. What can I do on the outside of my home?  Regularly fix the edges of walkways and driveways and fix any cracks.  Remove anything that might make you trip as you walk through a door, such as a raised step or threshold.  Trim any bushes or trees on the path to your home.  Use bright outdoor lighting.  Clear any walking paths of anything that might make someone trip, such as rocks or tools.  Regularly check to see if handrails are loose or broken. Make sure that both sides of any steps have handrails.  Any raised decks and porches should have guardrails on the edges.  Have any leaves, snow, or ice cleared regularly.  Use sand or salt on walking paths during winter.  Clean up any spills in your garage right away. This includes oil or grease spills. What can I do in the bathroom?  Use night lights.  Install grab bars by the toilet and in the tub and shower. Do not use towel bars as grab bars.  Use non-skid mats or decals in the tub or shower.  If you need to sit down in the shower, use a plastic, non-slip stool.  Keep the floor dry. Clean up any water that spills on the floor as soon as it happens.  Remove soap buildup in the tub or shower regularly.  Attach bath mats securely with double-sided non-slip rug tape.  Do not have throw rugs and other things on the floor that can make you trip. What can I do in the bedroom?  Use night lights.  Make sure that you have a light by your bed that is easy to reach.  Do not use any sheets or blankets that are too big for your bed. They should not hang down onto the floor.  Have a firm chair that has side arms. You can use this for support while you get dressed.  Do not have throw rugs and other things on the floor that can make you trip. What can  I do in the kitchen?  Clean up any spills right away.  Avoid walking on wet floors.  Keep items that you use a lot in easy-to-reach places.  If you need to reach something above you, use a strong step stool that has a grab bar.  Keep electrical cords out of the way.  Do not use floor polish or wax that makes floors slippery. If you must use wax, use non-skid floor wax.  Do not have throw rugs and other things on the floor that can make you trip. What can I do with my stairs?  Do not leave any items on the stairs.  Make sure that there are handrails on both sides of the stairs and use them. Fix handrails that are broken or loose. Make sure that handrails are as long as the stairways.  Check any carpeting to make sure that it is firmly attached to the stairs. Fix any carpet that is loose or worn.  Avoid having throw rugs at the top or bottom of the stairs. If you do have throw rugs, attach them to the floor with carpet tape.  Make sure that you have a light switch at the top of the stairs and the bottom of the stairs. If you   do not have them, ask someone to add them for you. What else can I do to help prevent falls?  Wear shoes that:  Do not have high heels.  Have rubber bottoms.  Are comfortable and fit you well.  Are closed at the toe. Do not wear sandals.  If you use a stepladder:  Make sure that it is fully opened. Do not climb a closed stepladder.  Make sure that both sides of the stepladder are locked into place.  Ask someone to hold it for you, if possible.  Clearly mark and make sure that you can see:  Any grab bars or handrails.  First and last steps.  Where the edge of each step is.  Use tools that help you move around (mobility aids) if they are needed. These include:  Canes.  Walkers.  Scooters.  Crutches.  Turn on the lights when you go into a dark area. Replace any light bulbs as soon as they burn out.  Set up your furniture so you have a  clear path. Avoid moving your furniture around.  If any of your floors are uneven, fix them.  If there are any pets around you, be aware of where they are.  Review your medicines with your doctor. Some medicines can make you feel dizzy. This can increase your chance of falling. Ask your doctor what other things that you can do to help prevent falls. This information is not intended to replace advice given to you by your health care provider. Make sure you discuss any questions you have with your health care provider. Document Released: 06/26/2009 Document Revised: 02/05/2016 Document Reviewed: 10/04/2014  2017 Elsevier  

## 2016-11-02 NOTE — Progress Notes (Signed)
Subjective:    Patient ID: Ryan [name], male    DOB: [birth year], 82 y.o.   MRN: [id number]  Pt presents to the office today chronic follow up. PT is a poor historian and has dementia.Per EPIC pt has been seen by Cardiolgoists on 07/20/16 with stable CAD, CHF, and cardiomyopathy with LVEF 45-50%.  Diabetes  He presents for his follow-up diabetic visit. He has type 2 diabetes mellitus. His disease course has been stable. There are no hypoglycemic associated symptoms. Pertinent negatives for hypoglycemia include no headaches. Pertinent negatives for diabetes include no blurred vision, no fatigue, no foot paresthesias and no polydipsia. Symptoms are stable. Diabetic complications include heart disease. Pertinent negatives for diabetic complications include no CVA, nephropathy or peripheral neuropathy. Risk factors for coronary artery disease include male sex and hypertension. Current diabetic treatment includes diet. His weight is stable. He is following a generally healthy diet. (Pt does not take blood glucose at home )  Shortness of Breath  This is a recurrent problem. The current episode started more than 1 year ago. The problem occurs constantly. The problem has been waxing and waning. Pertinent negatives include no ear pain, fever, headaches, leg swelling, sputum production or wheezing. He has tried rest for the symptoms. The treatment provided mild relief. His past medical history is significant for CAD, COPD and a heart failure. There is no history of allergies.  Hypertension  This is a chronic problem. The current episode started more than 1 year ago. The problem has been resolved since onset. The problem is controlled. Associated symptoms include shortness of breath. Pertinent negatives include no blurred vision, headaches, malaise/fatigue or peripheral edema. Risk factors for coronary artery disease include male gender and sedentary lifestyle. Past treatments include diuretics. Hypertensive  end-organ damage includes CAD/MI and heart failure. There is no history of kidney disease or CVA.  Hyperlipidemia  This is a chronic problem. The current episode started more than 1 year ago. The problem is controlled. Recent lipid tests were reviewed and are normal. Associated symptoms include shortness of breath. Current antihyperlipidemic treatment includes statins. The current treatment provides moderate improvement of lipids. Risk factors for coronary artery disease include diabetes mellitus, dyslipidemia, male sex and a sedentary lifestyle.  COPD PT currently using Breo "some times once a day when I remember". Pt states this seems to help. Discussed using everyday.    Review of Systems  Constitutional: Negative for fatigue, fever and malaise/fatigue.  HENT: Negative for ear pain.   Eyes: Negative for blurred vision.  Respiratory: Positive for cough and shortness of breath. Negative for sputum production and wheezing.   Cardiovascular: Negative for leg swelling.  Endocrine: Negative for polydipsia.  Neurological: Negative for headaches.  All other systems reviewed and are negative.      Objective:   Physical Exam  Constitutional: He is oriented to person, place, and time. He appears well-developed and well-nourished. No distress.  HENT:  Head: Normocephalic.  Right Ear: External ear normal.  Left Ear: External ear normal.  Mouth/Throat: Oropharynx is clear and moist.  Eyes: Pupils are equal, round, and reactive to light. Right eye exhibits no discharge. Left eye exhibits no discharge.  Neck: Normal range of motion. Neck supple. No thyromegaly present.  Cardiovascular: Normal rate, regular rhythm, normal heart sounds and intact distal pulses.   No murmur heard. Pulmonary/Chest: Effort normal and breath sounds normal. No respiratory distress. He has no wheezes.  Diminished breath sounds   Abdominal: Soft. Bowel sounds are normal. He  exhibits no distension. There is no  tenderness.  Musculoskeletal: Normal range of motion. He exhibits no edema or tenderness.  Neurological: He is alert and oriented to person, place, and time.  Skin: Skin is warm and dry. No rash noted. No erythema.  Psychiatric: He has a normal mood and affect. His behavior is normal. Judgment and thought content normal.  Vitals reviewed.      BP 125/64   Pulse 60   Temp 97.2 F (36.2 C) (Oral)   Ht _0  (1.854 m)   Wt 179 lb 3.2 oz (81.3 kg)   BMI 23.64 kg/m   Assessment & Plan:  1. Acute diastolic heart failure, NYHA class 1 (HCC) - CMP14+EGFR  2. Cardiomyopathy, unspecified type ([place]) - CMP14+EGFR  3. Atherosclerosis of native coronary artery of native heart with angina pectoris (HCC) - CMP14+EGFR - Lipid panel  4. Essential hypertension, benign - CMP14+EGFR  5. Pulmonary emphysema, unspecified emphysema type ([place]) - CMP14+EGFR  6. Controlled type 2 diabetes mellitus without complication, without long-term current use of insulin (HCC) - CMP14+EGFR - Microalbumin / creatinine urine ratio - Bayer DCA Hb A1c Waived - Ambulatory referral to Ophthalmology  7. Hyperlipidemia, unspecified hyperlipidemia type - CMP14+EGFR - Lipid panel  8. Normocytic anemia - CMP14+EGFR - Anemia Profile B  9. Alzheimer's dementia without behavioral disturbance, unspecified timing of dementia onset -Pt started on Namzaric starter pack and then given rx to continue -Discussed writing down things to help to remember -Falls precaution discused - Memantine HCl-Donepezil HCl (NAMZARIC) 7 & 14 & 21 &28 -10 MG C4PK; Take 1 tablet by mouth daily. Use as directed  Dispense: 28 each; Refill: 0 - Memantine HCl-Donepezil HCl (NAMZARIC) 28-10 MG CP24; Take 1 tablet by mouth daily.  Dispense: 90 capsule; Refill: 1   Continue all meds Labs pending Health Maintenance reviewed- Refuses all vaccines at this time Diet and exercise encouraged RTO 3 months   Evelina Dun, FNP

## 2016-11-03 LAB — ANEMIA PROFILE B
BASOS: 0 %
Basophils Absolute: 0 10*3/uL (ref 0.0–0.2)
EOS (ABSOLUTE): 0.1 10*3/uL (ref 0.0–0.4)
EOS: 1 %
Ferritin: 134 ng/mL (ref 30–400)
Folate: 7.6 ng/mL (ref >3.0)
HEMOGLOBIN: 11.8 g/dL — AB (ref 13.0–17.7)
Hematocrit: 35.5 % — ABNORMAL LOW (ref 37.5–51.0)
IMMATURE GRANS (ABS): 0 10*3/uL (ref 0.0–0.1)
IMMATURE GRANULOCYTES: 0 %
IRON SATURATION: 32 (ref 15–55)
IRON: 77 ug/dL (ref 38–169)
LYMPHS: 39 %
Lymphocytes Absolute: 2.7 10*3/uL (ref 0.7–3.1)
MCH: 31 pg (ref 26.6–33.0)
MCHC: 33.2 g/dL (ref 31.5–35.7)
MCV: 93 fL (ref 79–97)
Monocytes Absolute: 0.5 10*3/uL (ref 0.1–0.9)
Monocytes: 8 %
NEUTROS PCT: 52 %
Neutrophils Absolute: 3.5 10*3/uL (ref 1.4–7.0)
Platelets: 130 10*3/uL — ABNORMAL LOW (ref 150–379)
RBC: 3.81 x10E6/uL — ABNORMAL LOW (ref 4.14–5.80)
RDW: 14.9 % (ref 12.3–15.4)
Retic Ct Pct: 0.7 % (ref 0.6–2.6)
TIBC: 240 — AB (ref 250–450)
UIBC: 163 ug/dL (ref 111–343)
Vitamin B-12: >2000 pg/mL — ABNORMAL HIGH (ref 232–1245)
WBC: 6.8 10*3/uL (ref 3.4–10.8)

## 2016-11-03 LAB — CMP14+EGFR
ALBUMIN: 3.7 g/dL (ref 3.5–4.8)
ALT: 11 IU/L (ref 0–44)
AST: 16 IU/L (ref 0–40)
Albumin/Globulin Ratio: 1.3 (ref 1.2–2.2)
Alkaline Phosphatase: 68 IU/L (ref 39–117)
BILIRUBIN TOTAL: 0.5 mg/dL (ref 0.0–1.2)
BUN / CREAT RATIO: 19 (ref 10–24)
BUN: 19 mg/dL (ref 8–27)
CHLORIDE: 103 mmol/L (ref 96–106)
CO2: 25 mmol/L (ref 18–29)
Calcium: 9.1 mg/dL (ref 8.6–10.2)
Creatinine, Ser: 1.01 mg/dL (ref 0.76–1.27)
GFR calc non Af Amer: 70 (ref >59)
GFR, EST AFRICAN AMERICAN: 81 (ref >59)
GLOBULIN, TOTAL: 2.8 (ref 1.5–4.5)
GLUCOSE: 115 mg/dL — AB (ref 65–99)
Potassium: 4.2 mmol/L (ref 3.5–5.2)
SODIUM: 141 mmol/L (ref 134–144)
TOTAL PROTEIN: 6.5 g/dL (ref 6.0–8.5)

## 2016-11-03 LAB — LIPID PANEL
Chol/HDL Ratio: 3.3 (ref 0.0–5.0)
Cholesterol, Total: 153 mg/dL (ref 100–199)
HDL: 47 mg/dL (ref >39)
LDL CALC: 78 (ref 0–99)
Triglycerides: 142 mg/dL (ref 0–149)
VLDL CHOLESTEROL CAL: 28 (ref 5–40)

## 2016-12-09 DIAGNOSIS — Z961 Presence of intraocular lens: Secondary | ICD-10-CM | POA: Diagnosis not present

## 2016-12-15 ENCOUNTER — Ambulatory Visit: Payer: Medicare Other

## 2016-12-15 ENCOUNTER — Ambulatory Visit (INDEPENDENT_AMBULATORY_CARE_PROVIDER_SITE_OTHER): Payer: Medicare Other | Admitting: Family Medicine

## 2016-12-15 VITALS — BP 106/60 | HR 69 | Temp 97.0°F | Ht 73.0 in | Wt 173.8 lb

## 2016-12-15 DIAGNOSIS — N3000 Acute cystitis without hematuria: Secondary | ICD-10-CM | POA: Diagnosis not present

## 2016-12-15 DIAGNOSIS — R300 Dysuria: Secondary | ICD-10-CM | POA: Diagnosis not present

## 2016-12-15 LAB — MICROSCOPIC EXAMINATION
Bacteria, UA: NONE SEEN
RBC MICROSCOPIC, UA: NONE SEEN /HPF (ref 0–?)
WBC, UA: >30 /hpf — AB (ref 0–?)

## 2016-12-15 LAB — URINALYSIS, COMPLETE
Bilirubin, UA: NEGATIVE
Glucose, UA: NEGATIVE
KETONES UA: NEGATIVE
NITRITE UA: NEGATIVE
Protein, UA: NEGATIVE
RBC UA: NEGATIVE
SPEC GRAV UA: 1.01 (ref 1.005–1.030)
Urobilinogen, Ur: 0.2 mg/dL (ref 0.2–1.0)
pH, UA: 6 (ref 5.0–7.5)

## 2016-12-15 MED ORDER — SULFAMETHOXAZOLE-TRIMETHOPRIM 800-160 MG PO TABS: 1.0000 | ORAL_TABLET | Freq: Two times a day (BID) | ORAL | 0 refills | Status: DC

## 2016-12-15 NOTE — Progress Notes (Signed)
BP 106/60   Pulse 69   Temp 97 F (36.1 C) (Oral)   Ht 6\' 1"  (1.854 m)   Wt 173 lb 12.8 oz (78.8 kg)   BMI 22.93 kg/m    Subjective:    Patient ID: Ryan [name], male    DOB: 1934-11-28, 82 y.o.   MRN: [id number]  HPI: Ryan [name] is a 82 y.o. male presenting on 12/15/2016 for Urinary Tract Infection (Urine frequency, and dysuria )   HPI Dysuria and frequency Patient has been having dysuria and urinary frequency that is been going on for the past week. He says he has been trying to hydrate more but is just been getting worse and worse and he feels like he has to go all of the time. If he gets up or moves at all and he has to urinate again. He denies any fevers or chills or flank pain. He denies any blood in his urine.  Relevant past medical, surgical, family and social history reviewed and updated as indicated. Interim medical history since our last visit reviewed. Allergies and medications reviewed and updated.  Review of Systems  Constitutional: Negative for chills and fever.  Eyes: Negative for discharge.  Respiratory: Negative for shortness of breath and wheezing.   Cardiovascular: Negative for chest pain and leg swelling.  Gastrointestinal: Negative for abdominal pain, constipation, diarrhea, nausea and vomiting.  Genitourinary: Positive for dysuria, frequency and urgency. Negative for decreased urine volume, discharge, flank pain, hematuria, penile pain and testicular pain.  Musculoskeletal: Negative for back pain and gait problem.  Skin: Negative for rash.  All other systems reviewed and are negative.   Per HPI unless specifically indicated above     Objective:    BP 106/60   Pulse 69   Temp 97 F (36.1 C) (Oral)   Ht 6\' 1"  (1.854 m)   Wt 173 lb 12.8 oz (78.8 kg)   BMI 22.93 kg/m   Wt Readings from Last 3 Encounters:  12/15/16 173 lb 12.8 oz (78.8 kg)  11/02/16 179 lb 3.2 oz (81.3 kg)  07/29/16 180 lb 3.2 oz (81.7 kg)    Physical Exam    Constitutional: He is oriented to person, place, and time. He appears well-developed and well-nourished. No distress.  Eyes: Conjunctivae are normal. No scleral icterus.  Cardiovascular: Normal rate, regular rhythm, normal heart sounds and intact distal pulses.   No murmur heard. Pulmonary/Chest: Effort normal and breath sounds normal. No respiratory distress. He has no wheezes.  Abdominal: Soft. Bowel sounds are normal. He exhibits no distension. There is no hepatosplenomegaly. There is no tenderness (No abdominal tenderness on exam). There is no rebound, no guarding and no CVA tenderness.  Musculoskeletal: Normal range of motion. He exhibits no edema.  Neurological: He is alert and oriented to person, place, and time.  Skin: Skin is warm and dry. No rash noted. He is not diaphoretic.  Psychiatric: He has a normal mood and affect. His behavior is normal.  Nursing note and vitals reviewed.   Urinalysis: 2+ leukocytes, otherwise negative.    Assessment & Plan:   Problem List Items Addressed This Visit    None    Visit Diagnoses    Acute cystitis without hematuria    -  Primary   Relevant Medications   sulfamethoxazole-trimethoprim (BACTRIM DS,SEPTRA DS) 800-160 MG tablet   Other Relevant Orders   Urine culture   Urinalysis, Complete       Follow up plan: Return if symptoms worsen  or fail to improve.  Counseling provided for all of the vaccine components Orders Placed This Encounter  Procedures  . Urine culture  . Urinalysis, Complete    Arville Care, MD [facility] 12/15/2016, 1:53 PM

## 2016-12-18 LAB — URINE CULTURE

## 2016-12-21 ENCOUNTER — Ambulatory Visit (INDEPENDENT_AMBULATORY_CARE_PROVIDER_SITE_OTHER): Payer: Medicare Other

## 2016-12-21 VITALS — BP 108/63 | HR 62 | Temp 98.1°F | Ht 71.75 in | Wt 177.0 lb

## 2016-12-21 DIAGNOSIS — Z0000 Encounter for general adult medical examination without abnormal findings: Secondary | ICD-10-CM | POA: Diagnosis not present

## 2016-12-21 NOTE — Progress Notes (Addendum)
Subjective:   Ryan [name] is a 82 y.o. male who presents for an Initial Medicare Annual Wellness Visit.  Review of Systems   Patient is here today for Medicare annual wellness visit.  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  The patient reports he spends a lot of time reading all types of book which he enjoys.  He does not perform any type of exercise on a normal basis.  Patient reports he normally either eats microwave meals or goes out to eat.  He usually eats twice per day and may have a snack sometime during the day.  He is an active member at [employer] where he loves to sing solos and song in the choir.  He lives alone in his home that he has owned for several years.  Patient reports he has no family living close by.  He does have several nieces and nephews who live in [place] and come to visit him.  He has had pets in the past but does not own any at this time.  Safety hazards in the home were discussed with the patient and he reports that he understands.  He feels that his health is better at this time than it was one year ago.  He has a follow up with Jannifer Rodney on 02/01/2017.        Objective:    Today's Vitals   12/21/16 1319  BP: 108/63  Pulse: 62  Temp: 98.1 F (36.7 C)  TempSrc: Oral  Weight: 177 lb (80.3 kg)  Height: 5' 11.75" (1.822 m)   Body mass index is 24.17 kg/m.  Current Medications (verified) Outpatient Encounter Prescriptions as of 12/21/2016  Medication Sig  . albuterol (PROVENTIL HFA;VENTOLIN HFA) 108 (90 Base) MCG/ACT inhaler Inhale 2 puffs into the lungs every 6 (six) hours as needed for wheezing or shortness of breath.  Marland Kitchen albuterol (PROVENTIL) (2.5 MG/3ML) 0.083% nebulizer solution Take 3 mLs (2.5 mg total) by nebulization every 6 (six) hours  as needed for wheezing or shortness of breath.  . Ascorbic Acid (VITAMIN C) 100 MG tablet Take 500 mg by mouth 2 (two) times daily.   Marland Kitchen aspirin EC 81 MG tablet Take 81 mg by mouth every other day.   Marland Kitchen atorvastatin (LIPITOR) 40 MG tablet TAKE ONE TABLET BY MOUTH ONCE DAILY  . fluticasone furoate-vilanterol (BREO ELLIPTA) 100-25 MCG/INH AEPB Inhale 1 puff into the lungs daily.  . furosemide (LASIX) 20 MG tablet TAKE ONE TABLET BY MOUTH ONCE DAILY AT  NOON  . Garlic 1000 MG CAPS Take 3 capsules by mouth every morning.   . Ginger 500 MG CAPS Take 1 capsule by mouth daily.   . hydrALAZINE (APRESOLINE) 50 MG tablet TAKE ONE TABLET BY MOUTH EVERY 12 HOURS  . ibuprofen (ADVIL,MOTRIN) 200 MG tablet Take 400 mg by mouth every 6 (six) hours as needed for moderate pain.  . isosorbide mononitrate (IMDUR) 30 MG 24 hr tablet TAKE ONE-HALF TABLET BY MOUTH ONCE DAILY  . Memantine HCl-Donepezil HCl (NAMZARIC) 28-10 MG CP24 Take 1 tablet by mouth daily.  . Memantine HCl-Donepezil HCl (NAMZARIC) 7 & 14 & 21 &28 -10 MG C4PK Take 1 tablet by mouth daily. Use as directed  . potassium chloride (K-DUR) 10 MEQ tablet TAKE ONE TABLET BY MOUTH ONCE DAILY  .  sulfamethoxazole-trimethoprim (BACTRIM DS,SEPTRA DS) 800-160 MG tablet Take 1 tablet by mouth 2 (two) times daily.  . vitamin B-12 (CYANOCOBALAMIN) 1000 MCG tablet Take 0.5 tablets (500 mcg total) by mouth daily. (Patient taking differently: Take 1,000 mcg by mouth 3 (three) times daily. )   Facility-Administered Encounter Medications as of 12/21/2016  Medication  . cyanocobalamin ((VITAMIN B-12)) injection 1,000 mcg    Allergies (verified) Lisinopril   History: Past Medical History:  Diagnosis Date  . Abnormal ECG   . Cardiomyopathy (HCC)    LVEF 45-50%  . Coronary atherosclerosis of native coronary artery    Based on Myoview - managing medically  . Dementia   . Essential hypertension, benign   . Type 2 diabetes mellitus (HCC)   . Vitamin B12 deficiency  05/01/2016   Past Surgical History:  Procedure Laterality Date  . EYE SURGERY Bilateral 08/2016   Dr. Sharl Ma, [place]  . HERNIA REPAIR    . HIP SURGERY    . KNEE SURGERY    . PROSTATE SURGERY     Family History  Problem Relation Age of Onset  . Heart attack Mother   . Emphysema Mother   . COPD Father   . Alzheimer's disease Sister   . Heart disease Brother   . Diabetes Paternal Grandfather   . COPD Sister   . Cancer Sister     breast  . AAA (abdominal aortic aneurysm) Brother   . Heart attack Brother    Social History   Occupational History  . Not on file.   Social History Main Topics  . Smoking status: Former Smoker    Packs/day: 1.00    Years: 15.00    Types: Cigarettes    Quit date: 01/12/1967  . Smokeless tobacco: Never Used  . Alcohol use No  . Drug use: No  . Sexual activity: No   Tobacco Counseling Patient is a former smoker who quit smoking in 1968.    Activities of Daily Living In your present state of health, do you have any difficulty performing the following activities: 12/21/2016 04/30/2016  Hearing? N Y  Vision? N N  Difficulty concentrating or making decisions? N Y  Walking or climbing stairs? N N  Dressing or bathing? N N  Doing errands, shopping? N N  Some recent data might be hidden    Immunizations and Health Maintenance Immunization History  Administered Date(s) Administered  . Pneumococcal Conjugate-13 05/27/2016  . Pneumococcal Polysaccharide-23 07/08/2012   Health Maintenance Due  Topic Date Due  . FOOT EXAM  11/04/2016  . URINE MICROALBUMIN  11/04/2016    Patient Care Team: [name], FNP as PCP - General (Family Medicine) Jonelle Sidle, MD (Cardiology)  Indicate any recent Medical Services you may have received from other than [facility] providers in the past year (date may be approximate).    Assessment:   This is a routine wellness examination for Ryan [name].  Hearing/Vision screen  Patient reports that  he does not have any problems with his hearing.  He had cataract removal in December 2017 and reports his vision has improved since having the surgery.  Dietary issues and exercise activities discussed:   Goals    . Exercise 3x per week (30 min per time)    . Have 3 meals a day      Depression Screen PHQ 2/9 Scores 12/21/2016 12/15/2016 07/29/2016 06/29/2016  PHQ - 2 Score 0 0 1 1  PHQ- 9 Score - - - -  Fall Risk Fall Risk  12/21/2016 12/15/2016 11/02/2016 07/29/2016 06/29/2016  Falls in the past year? Yes Yes Yes No No  Number falls in past yr: 2 or more 2 or more 2 or more - -  Injury with Fall? No No No - -  Risk Factor Category  - High Fall Risk - - -  Risk for fall due to : - - - - -  Follow up - Falls prevention discussed - - -    Cognitive Function:  Patient scored 27 out of 30 on his MMSE.  He did not appear to have difficulty answering the questions asked.   MMSE - Mini Mental State Exam 12/21/2016 05/12/2015  Orientation to time 5 4  Orientation to Place 5 5  Registration 3 3  Attention/ Calculation 4 5  Recall 1 1  Language- name 2 objects 2 2  Language- repeat 1 1  Language- follow 3 step command 3 3  Language- read & follow direction 1 1  Write a sentence 1 1  Copy design 1 1  Total score 27 27        Screening Tests Health Maintenance  Topic Date Due  . FOOT EXAM  11/04/2016  . URINE MICROALBUMIN  11/04/2016  . OPHTHALMOLOGY EXAM  01/26/2017 (Originally 06/12/2015)  . TETANUS/TDAP  05/27/2017 (Originally 02/21/1954)  . INFLUENZA VACCINE  04/13/2017  . HEMOGLOBIN A1C  05/02/2017  . PNA vac Low Risk Adult  Completed        Plan:   Followup with PCP on 02/01/17 for 3 month checkup.  At that time it is recommended that we discuss with patient his need for a Tdap, Shingrix and Prevnar 13 vaccine.  Patient has never had a colonoscopy so he should have an FOBT screening at his next appointment.  During the course of the visit Ryan [name] was educated and  counseled about the following appropriate screening and preventive services:   Vaccines to include Pneumoccal, Influenza, Hepatitis B, Td, Zostavax, HCV  Electrocardiogram  Colorectal cancer screening  Cardiovascular disease screening  Diabetes screening  Glaucoma screening  Nutrition counseling  Prostate cancer screening  Smoking cessation counseling  Patient Instructions (the written plan) were given to the patient.   Ryan Brilliant, LPN   1/61/0960   I have reviewed and agree with the above AWV documentation.   Jannifer Rodney, FNP

## 2016-12-21 NOTE — Patient Instructions (Signed)
  Mr. Burke , Thank you for taking time to come for your Medicare Wellness Visit. I appreciate your ongoing commitment to your health goals. Please review the following plan we discussed and let me know if I can assist you in the future.   These are the goals we discussed: Goals    . Exercise 3x per week (30 min per time)    . Have 3 meals a day       This is a list of the screening recommended for you and due dates:  Health Maintenance  Topic Date Due  . Complete foot exam   11/04/2016  . Urine Protein Check  11/04/2016  . Eye exam for diabetics  01/26/2017*  . Tetanus Vaccine  05/27/2017*  . Flu Shot  04/13/2017  . Hemoglobin A1C  05/02/2017  . Pneumonia vaccines  Completed  *Topic was postponed. The date shown is not the original due date.   Followup with your PCP on 02/01/17.    Please review the informational packet you received about advanced directives.  If you have any questions, you may consult an attorney who can explains things to you.

## 2016-12-23 ENCOUNTER — Telehealth: Payer: Self-pay | Admitting: Family

## 2016-12-23 NOTE — Telephone Encounter (Signed)
Advised pt he isn't on any medication for his Diabetes as it is controlled with diet and exercise. Pt voiced understanding.

## 2017-01-06 ENCOUNTER — Ambulatory Visit: Payer: Medicare Other

## 2017-01-25 ENCOUNTER — Other Ambulatory Visit: Payer: Self-pay | Admitting: Adult Health

## 2017-02-01 ENCOUNTER — Encounter: Payer: Self-pay | Admitting: Family

## 2017-02-01 ENCOUNTER — Ambulatory Visit (INDEPENDENT_AMBULATORY_CARE_PROVIDER_SITE_OTHER): Payer: Medicare Other | Admitting: Family

## 2017-02-01 ENCOUNTER — Other Ambulatory Visit: Payer: Self-pay | Admitting: Cardiology

## 2017-02-01 VITALS — BP 105/57 | HR 50 | Temp 96.7°F | Ht 71.75 in | Wt 182.6 lb

## 2017-02-01 DIAGNOSIS — G309 Alzheimer's disease, unspecified: Secondary | ICD-10-CM | POA: Diagnosis not present

## 2017-02-01 DIAGNOSIS — I10 Essential (primary) hypertension: Secondary | ICD-10-CM | POA: Diagnosis not present

## 2017-02-01 DIAGNOSIS — I25119 Atherosclerotic heart disease of native coronary artery with unspecified angina pectoris: Secondary | ICD-10-CM | POA: Diagnosis not present

## 2017-02-01 DIAGNOSIS — D649 Anemia, unspecified: Secondary | ICD-10-CM | POA: Diagnosis not present

## 2017-02-01 DIAGNOSIS — I5031 Acute diastolic (congestive) heart failure: Secondary | ICD-10-CM

## 2017-02-01 DIAGNOSIS — E119 Type 2 diabetes mellitus without complications: Secondary | ICD-10-CM | POA: Diagnosis not present

## 2017-02-01 DIAGNOSIS — F0280 Dementia in other diseases classified elsewhere without behavioral disturbance: Secondary | ICD-10-CM

## 2017-02-01 DIAGNOSIS — E785 Hyperlipidemia, unspecified: Secondary | ICD-10-CM | POA: Diagnosis not present

## 2017-02-01 DIAGNOSIS — I429 Cardiomyopathy, unspecified: Secondary | ICD-10-CM | POA: Diagnosis not present

## 2017-02-01 LAB — BAYER DCA HB A1C WAIVED: HB A1C: 5.7 % (ref <7.0)

## 2017-02-01 MED ORDER — HYDRALAZINE HCL 25 MG PO TABS: 25.0000 mg | ORAL_TABLET | Freq: Two times a day (BID) | ORAL | 1 refills | Status: DC

## 2017-02-01 NOTE — Patient Instructions (Signed)
Fall Prevention in the Home Falls can cause injuries and can affect people from all age groups. There are many simple things that you can do to make your home safe and to help prevent falls. What can I do on the outside of my home?  Regularly repair the edges of walkways and driveways and fix any cracks.  Remove high doorway thresholds.  Trim any shrubbery on the main path into your home.  Use bright outdoor lighting.  Clear walkways of debris and clutter, including tools and rocks.  Regularly check that handrails are securely fastened and in good repair. Both sides of any steps should have handrails.  Install guardrails along the edges of any raised decks or porches.  Have leaves, snow, and ice cleared regularly.  Use sand or salt on walkways during winter months.  In the garage, clean up any spills right away, including grease or oil spills. What can I do in the bathroom?  Use night lights.  Install grab bars by the toilet and in the tub and shower. Do not use towel bars as grab bars.  Use non-skid mats or decals on the floor of the tub or shower.  If you need to sit down while you are in the shower, use a plastic, non-slip stool.  Keep the floor dry. Immediately clean up any water that spills on the floor.  Remove soap buildup in the tub or shower on a regular basis.  Attach bath mats securely with double-sided non-slip rug tape.  Remove throw rugs and other tripping hazards from the floor. What can I do in the bedroom?  Use night lights.  Make sure that a bedside light is easy to reach.  Do not use oversized bedding that drapes onto the floor.  Have a firm chair that has side arms to use for getting dressed.  Remove throw rugs and other tripping hazards from the floor. What can I do in the kitchen?  Clean up any spills right away.  Avoid walking on wet floors.  Place frequently used items in easy-to-reach places.  If you need to reach for something above  you, use a sturdy step stool that has a grab bar.  Keep electrical cables out of the way.  Do not use floor polish or wax that makes floors slippery. If you have to use wax, make sure that it is non-skid floor wax.  Remove throw rugs and other tripping hazards from the floor. What can I do in the stairways?  Do not leave any items on the stairs.  Make sure that there are handrails on both sides of the stairs. Fix handrails that are broken or loose. Make sure that handrails are as long as the stairways.  Check any carpeting to make sure that it is firmly attached to the stairs. Fix any carpet that is loose or worn.  Avoid having throw rugs at the top or bottom of stairways, or secure the rugs with carpet tape to prevent them from moving.  Make sure that you have a light switch at the top of the stairs and the bottom of the stairs. If you do not have them, have them installed. What are some other fall prevention tips?  Wear closed-toe shoes that fit well and support your feet. Wear shoes that have rubber soles or low heels.  When you use a stepladder, make sure that it is completely opened and that the sides are firmly locked. Have someone hold the ladder while you are using   it. Do not climb a closed stepladder.  Add color or contrast paint or tape to grab bars and handrails in your home. Place contrasting color strips on the first and last steps.  Use mobility aids as needed, such as canes, walkers, scooters, and crutches.  Turn on lights if it is dark. Replace any light bulbs that burn out.  Set up furniture so that there are clear paths. Keep the furniture in the same spot.  Fix any uneven floor surfaces.  Choose a carpet design that does not hide the edge of steps of a stairway.  Be aware of any and all pets.  Review your medicines with your healthcare provider. Some medicines can cause dizziness or changes in blood pressure, which increase your risk of falling. Talk with  your health care provider about other ways that you can decrease your risk of falls. This may include working with a physical therapist or trainer to improve your strength, balance, and endurance. This information is not intended to replace advice given to you by your health care provider. Make sure you discuss any questions you have with your health care provider. Document Released: 08/20/2002 Document Revised: 01/27/2016 Document Reviewed: 10/04/2014 Elsevier Interactive Patient Education  2017 Elsevier Inc.  

## 2017-02-01 NOTE — Progress Notes (Signed)
Subjective:    Patient ID: Ryan [name], male    DOB: 1934-10-07, 82 y.o.   MRN: [id number]  Pt presents to the office today chronic follow up. PT is a poor historian and has dementia.Per EPIC pt has been seen by [facility] on 07/20/16 with stable CAD, CHF, and cardiomyopathy with LVEF 45-50% and has a follow up this Friday.  Diabetes  He presents for his follow-up diabetic visit. He has type 2 diabetes mellitus. There are no hypoglycemic associated symptoms. Pertinent negatives for diabetes include no blurred vision, no foot paresthesias and no visual change. Symptoms are stable. Diabetic complications include a CVA and heart disease. He is following a generally healthy diet. (Does not check blood glucose at home )  Hyperlipidemia  This is a chronic problem. The current episode started more than 1 year ago. The problem is controlled. Recent lipid tests were reviewed and are normal. Pertinent negatives include no shortness of breath. Current antihyperlipidemic treatment includes statins. The current treatment provides moderate improvement of lipids.  Hypertension  This is a chronic problem. The current episode started more than 1 year ago. The problem has been resolved since onset. The problem is controlled. Associated symptoms include malaise/fatigue. Pertinent negatives include no blurred vision, peripheral edema or shortness of breath. Risk factors for coronary artery disease include diabetes mellitus, dyslipidemia, male gender, sedentary lifestyle and family history. The current treatment provides moderate improvement. Hypertensive end-organ damage includes CVA.  Anemia  Presents for follow-up visit. Symptoms include bruises/bleeds easily and malaise/fatigue.  COPD Pt using Breo as needed. PT told to take daily!! Pt quit smoking in 1978.     Review of Systems  Constitutional: Positive for malaise/fatigue.  Eyes: Negative for blurred vision.  Respiratory: Negative for shortness  of breath.   Hematological: Bruises/bleeds easily.       Objective:   Physical Exam  Constitutional: He is oriented to person, place, and time. He appears well-developed and well-nourished. No distress.  Confused at times, but A&O X4  HENT:  Head: Normocephalic.  Right Ear: External ear normal.  Left Ear: External ear normal.  Nose: Nose normal.  Mouth/Throat: Oropharynx is clear and moist.  Eyes: Pupils are equal, round, and reactive to light. Right eye exhibits no discharge. Left eye exhibits no discharge.  Neck: Normal range of motion. Neck supple. No thyromegaly present.  Cardiovascular: Normal rate, regular rhythm, normal heart sounds and intact distal pulses.   No murmur heard. Pulmonary/Chest: Effort normal and breath sounds normal. No respiratory distress. He has no wheezes.  Abdominal: Soft. Bowel sounds are normal. He exhibits no distension. There is no tenderness.  Musculoskeletal: Normal range of motion. He exhibits no edema or tenderness.  Neurological: He is alert and oriented to person, place, and time.  Skin: Skin is warm and dry. No rash noted. No erythema.  Psychiatric: His behavior is normal. Judgment and thought content normal. His mood appears anxious.  Vitals reviewed.     BP (!) 105/57   Pulse (!) 50   Temp (!) 96.7 F (35.9 C) (Oral)   Ht 5' 11.75" (1.822 m)   Wt 182 lb 9.6 oz (82.8 kg)   BMI 24.94 kg/m      Assessment & Plan:  1. Essential hypertension, benign -Decreased Hydralazine to 25 mg BID from 50 mg BID - CMP14+EGFR - hydrALAZINE (APRESOLINE) 25 MG tablet; Take 1 tablet (25 mg total) by mouth 2 (two) times daily.  Dispense: 60 tablet; Refill: 1  2. Atherosclerosis of  native coronary artery of native heart with angina pectoris (HCC) - CMP14+EGFR - Lipid panel  3. Acute diastolic heart failure, NYHA class 1 (HCC) - CMP14+EGFR - hydrALAZINE (APRESOLINE) 25 MG tablet; Take 1 tablet (25 mg total) by mouth 2 (two) times daily.  Dispense:  60 tablet; Refill: 1  4. Controlled type 2 diabetes mellitus without complication, without long-term current use of insulin (HCC) - CMP14+EGFR - Bayer DCA Hb A1c Waived - Microalbumin / creatinine urine ratio  5. Alzheimer's dementia without behavioral disturbance, unspecified timing of dementia onset - CMP14+EGFR  6. Hyperlipidemia, unspecified hyperlipidemia type - CMP14+EGFR - Lipid panel  7. Normocytic anemia - CMP14+EGFR  8. Cardiomyopathy, unspecified type ([place]) - CMP14+EGFR   Continue all meds and keep cardiologists appt Labs pending Health Maintenance reviewed Diet and exercise encouraged RTO 3  Months   Evelina Dun, FNP

## 2017-02-02 LAB — CMP14+EGFR
A/G RATIO: 1.3 (ref 1.2–2.2)
ALT: 11 IU/L (ref 0–44)
AST: 19 IU/L (ref 0–40)
Albumin: 3.7 g/dL (ref 3.5–4.8)
Alkaline Phosphatase: 65 IU/L (ref 39–117)
BILIRUBIN TOTAL: 0.7 mg/dL (ref 0.0–1.2)
BUN/Creatinine Ratio: 17 (ref 10–24)
BUN: 17 mg/dL (ref 8–27)
CALCIUM: 9.4 mg/dL (ref 8.6–10.2)
CO2: 26 mmol/L (ref 18–29)
Chloride: 103 mmol/L (ref 96–106)
Creatinine, Ser: 1.02 mg/dL (ref 0.76–1.27)
GFR, EST AFRICAN AMERICAN: 80 mL/min/{1.73_m2} (ref >59)
GFR, EST NON AFRICAN AMERICAN: 70 mL/min/{1.73_m2} (ref >59)
GLOBULIN, TOTAL: 2.8 g/dL (ref 1.5–4.5)
Glucose: 109 mg/dL — ABNORMAL HIGH (ref 65–99)
Potassium: 4.4 mmol/L (ref 3.5–5.2)
SODIUM: 142 mmol/L (ref 134–144)
Total Protein: 6.5 g/dL (ref 6.0–8.5)

## 2017-02-02 LAB — MICROALBUMIN / CREATININE URINE RATIO
Creatinine, Urine: 129.2 mg/dL
Microalb/Creat Ratio: 7.4 mg/g{creat} (ref 0.0–30.0)
Microalbumin, Urine: 9.5 ug/mL

## 2017-02-02 LAB — LIPID PANEL
Chol/HDL Ratio: 3 ratio (ref 0.0–5.0)
Cholesterol, Total: 134 mg/dL (ref 100–199)
HDL: 44 mg/dL
LDL Calculated: 61 mg/dL (ref 0–99)
Triglycerides: 147 mg/dL (ref 0–149)
VLDL Cholesterol Cal: 29 mg/dL (ref 5–40)

## 2017-02-03 NOTE — Progress Notes (Signed)
Cardiology Office Note  Date: 02/04/2017   ID: Ryan [name], Ryan [name] April 19, 1935, MRN [id number]  PCP: Ryan Spencer, FNP  Primary Cardiologist: Ryan Dell, MD   Chief Complaint  Patient presents with  . Coronary Artery Disease    History of Present Illness: Ryan [name] is a 82 y.o. male last seen in November 2017. He presents for a routine follow-up visit. He does not report any chest pain. Remains limited in terms of dyspnea on exertion and also ambulation with a cane. He has had some interval foot swelling, but states that this improved by putting his feet up. He also remains on Lasix. We discussed increasing the dose of Lasix temporarily for weight gain or foot swelling.  We have been managing him conservatively with a history of ischemic heart disease and cardiomyopathy. Current cardiac regimen includes aspirin, Lipitor, Lasix, hydralazine, Imdur, and potassium supplements.  Past Medical History:  Diagnosis Date  . Abnormal ECG   . Cardiomyopathy (HCC)    LVEF 45-50%  . Coronary atherosclerosis of native coronary artery    Based on Myoview - managing medically  . Dementia   . Essential hypertension, benign   . Type 2 diabetes mellitus (HCC)   . Vitamin B12 deficiency 05/01/2016    Past Surgical History:  Procedure Laterality Date  . EYE SURGERY Bilateral 08/2016   Dr. Sharl [name], Ryan [name]  . HERNIA REPAIR    . HIP SURGERY    . KNEE SURGERY    . PROSTATE SURGERY      Current Outpatient Prescriptions  Medication Sig Dispense Refill  . albuterol (PROVENTIL HFA;VENTOLIN HFA) 108 (90 Base) MCG/ACT inhaler Inhale 2 puffs into the lungs every 6 (six) hours as needed for wheezing or shortness of breath. 1 Inhaler 2  . albuterol (PROVENTIL) (2.5 MG/3ML) 0.083% nebulizer solution Take 3 mLs (2.5 mg total) by nebulization every 6 (six) hours as needed for wheezing or shortness of breath. 150 mL 1  . Ascorbic Acid (VITAMIN C) 100 MG tablet Take 500 mg by mouth  2 (two) times daily.     Marland Kitchen aspirin EC 81 MG tablet Take 81 mg by mouth every other day.     Marland Kitchen atorvastatin (LIPITOR) 40 MG tablet TAKE ONE TABLET BY MOUTH ONCE DAILY 90 tablet 1  . fluticasone furoate-vilanterol (BREO ELLIPTA) 100-25 MCG/INH AEPB Inhale 1 puff into the lungs daily. 60 each 3  . furosemide (LASIX) 20 MG tablet TAKE ONE TABLET BY MOUTH ONCE DAILY AT NOON 30 tablet 6  . Garlic 1000 MG CAPS Take 3 capsules by mouth every morning.     . Ginger 500 MG CAPS Take 1 capsule by mouth daily.     . hydrALAZINE (APRESOLINE) 25 MG tablet Take 1 tablet (25 mg total) by mouth 2 (two) times daily. 60 tablet 1  . ibuprofen (ADVIL,MOTRIN) 200 MG tablet Take 400 mg by mouth every 6 (six) hours as needed for moderate pain.    . isosorbide mononitrate (IMDUR) 30 MG 24 hr tablet TAKE ONE-HALF TABLET BY MOUTH ONCE DAILY 30 tablet 2  . Memantine HCl-Donepezil HCl (NAMZARIC) 28-10 MG CP24 Take 1 tablet by mouth daily. 90 capsule 1  . potassium chloride (K-DUR) 10 MEQ tablet TAKE ONE TABLET BY MOUTH ONCE DAILY 90 tablet 3  . vitamin B-12 (CYANOCOBALAMIN) 1000 MCG tablet Take 0.5 tablets (500 mcg total) by mouth daily. (Patient taking differently: Take 1,000 mcg by mouth 3 (three) times daily. )     No current  facility-administered medications for this visit.    Allergies:  Lisinopril   Social History: The patient  reports that he quit smoking about 50 years ago. His smoking use included Cigarettes. He started smoking about 66 years ago. He has a 15.00 pack-year smoking history. He has never used smokeless tobacco. He reports that he does not drink alcohol or use drugs.   ROS:  Please see the history of present illness. Otherwise, complete review of systems is positive for being "cold natured." Arthritic pains. All other systems are reviewed and negative.   Physical Exam: VS:  BP (!) 144/65   Pulse (!) 55   Ht 6\' 3"  (1.905 m)   Wt 183 lb (83 kg)   SpO2 99% Comment: on room air  BMI 22.87 kg/m , BMI  Body mass index is 22.87 kg/m.  Wt Readings from Last 3 Encounters:  02/04/17 183 lb (83 kg)  02/01/17 182 lb 9.6 oz (82.8 kg)  12/21/16 177 lb (80.3 kg)    Elderly male in no distress, using a cane. HEENT: Conjunctiva and lids normal, oropharynx clear.  Neck: Supple, no elevated JVP or carotid bruits, no thyromegaly.  Lungs: Clear to auscultation, nonlabored breathing at rest.  Cardiac: Regular rate and rhythm, no S3, soft systolic murmur at apex, no pericardial rub.  Abdomen: Soft, nontender, bowel sounds present.  Extremities: Trace ankle edema, distal pulses 2+.  Skin: Warm and dry.  Musculoskeletal: No kyphosis.  Neuropsychiatric: Alert and oriented x3, calm.  ECG: I personally reviewed the tracing from 04/30/2016 which showed sinus rhythm with prolonged PR interval, old inferolateral infarct pattern.  Recent Labwork: 04/30/2016: TSH 1.477 06/29/2016: B Natriuretic Peptide 779.0; Hemoglobin 11.7 11/02/2016: Platelets 130 02/01/2017: ALT 11; AST 19; BUN 17; Creatinine, Ser 1.02; Potassium 4.4; Sodium 142     Component Value Date/Time   CHOL 134 02/01/2017 0937   TRIG 147 02/01/2017 0937   TRIG 210 (H) 06/24/2014 1424   HDL 44 02/01/2017 0937   HDL 45 06/24/2014 1424   CHOLHDL 3.0 02/01/2017 0937   CHOLHDL 3.0 07/08/2012 0551   VLDL 22 07/08/2012 0551   LDLCALC 61 02/01/2017 0937   LDLCALC 72 06/24/2014 1424    Other Studies Reviewed Today:  Lexiscan Cardiolite 08/21/2012: IMPRESSION:  Abnormal but relatively low risk Lexiscan Myoview as outlined. Baseline ECG shows ST-segment abnormalities, nondiagnostic accentuation noted with stress, no arrhythmias noted. Perfusion imaging is consistent with ischemia at the inferior apex, probable soft tissue attenuation affecting the anteroseptal apex. LVEF is calculated at 65%.  Echocardiogram 05/02/2015: Study Conclusions  - Left ventricle: Technically difficult study. There is hypokinesis  of base inferior  segment. Inferolateral wall difficult to assess.  There is hypokinesis of this wall. The EF is 45-50% The cavity  size was normal. Wall thickness was increased in a pattern of  mild LVH. - Mitral valve: Calcified annulus. There was no significant  regurgitation. - Right ventricle: The cavity size was normal. Systolic function  was normal.  Assessment and Plan:  1. Ischemic heart disease based on prior noninvasive imaging. He does not report angina symptoms on medical therapy, and plan has been to continue with conservative management.  2. Chronic combined heart failure with LVEF 45-50%. We discussed doubling Lasix with potassium supplements for a day or 2 at a time if he notices weight gain of 3 pounds in 24 hours or 5 pounds in a week. Continue hydralazine and Imdur. He has an ACE inhibitor allergy. Not on beta blocker with resting bradycardia.  3. Essential hypertension, hydralazine dose recently reduced by PCP due to relative hypotension.  4. Type 2 diabetes mellitus, recent hemoglobin A1c 5.7. Keep follow-up with PCP.  Current medicines were reviewed with the patient today.  Disposition: Follow-up in 6 months.  Signed, Jonelle Sidle, MD, [facility] 02/04/2017 9:28 AM    [facility] at [facility] [address] [place], [place], [place] [address] Phone: [phone]; Fax: [phone]

## 2017-02-04 ENCOUNTER — Ambulatory Visit (INDEPENDENT_AMBULATORY_CARE_PROVIDER_SITE_OTHER): Payer: Medicare Other | Admitting: Cardiology

## 2017-02-04 ENCOUNTER — Encounter: Payer: Self-pay | Admitting: Cardiology

## 2017-02-04 VITALS — BP 144/65 | HR 55 | Ht 75.0 in | Wt 183.0 lb

## 2017-02-04 DIAGNOSIS — E119 Type 2 diabetes mellitus without complications: Secondary | ICD-10-CM

## 2017-02-04 DIAGNOSIS — I2510 Atherosclerotic heart disease of native coronary artery without angina pectoris: Secondary | ICD-10-CM | POA: Diagnosis not present

## 2017-02-04 DIAGNOSIS — I10 Essential (primary) hypertension: Secondary | ICD-10-CM | POA: Diagnosis not present

## 2017-02-04 DIAGNOSIS — I5042 Chronic combined systolic (congestive) and diastolic (congestive) heart failure: Secondary | ICD-10-CM | POA: Diagnosis not present

## 2017-02-04 NOTE — Patient Instructions (Signed)

## 2017-03-08 DIAGNOSIS — H35372 Puckering of macula, left eye: Secondary | ICD-10-CM | POA: Diagnosis not present

## 2017-03-08 DIAGNOSIS — H01001 Unspecified blepharitis right upper eyelid: Secondary | ICD-10-CM | POA: Diagnosis not present

## 2017-03-08 DIAGNOSIS — H532 Diplopia: Secondary | ICD-10-CM | POA: Diagnosis not present

## 2017-03-08 DIAGNOSIS — H5000 Unspecified esotropia: Secondary | ICD-10-CM | POA: Diagnosis not present

## 2017-03-08 DIAGNOSIS — H01002 Unspecified blepharitis right lower eyelid: Secondary | ICD-10-CM | POA: Diagnosis not present

## 2017-05-05 ENCOUNTER — Ambulatory Visit (INDEPENDENT_AMBULATORY_CARE_PROVIDER_SITE_OTHER): Payer: Medicare Other | Admitting: Family

## 2017-05-05 ENCOUNTER — Encounter: Payer: Self-pay | Admitting: Family

## 2017-05-05 VITALS — BP 137/69 | HR 58 | Temp 96.9°F | Ht 75.0 in | Wt 181.8 lb

## 2017-05-05 DIAGNOSIS — I10 Essential (primary) hypertension: Secondary | ICD-10-CM | POA: Diagnosis not present

## 2017-05-05 DIAGNOSIS — I25119 Atherosclerotic heart disease of native coronary artery with unspecified angina pectoris: Secondary | ICD-10-CM | POA: Diagnosis not present

## 2017-05-05 DIAGNOSIS — I5031 Acute diastolic (congestive) heart failure: Secondary | ICD-10-CM

## 2017-05-05 DIAGNOSIS — E538 Deficiency of other specified B group vitamins: Secondary | ICD-10-CM

## 2017-05-05 DIAGNOSIS — F0280 Dementia in other diseases classified elsewhere without behavioral disturbance: Secondary | ICD-10-CM

## 2017-05-05 DIAGNOSIS — D649 Anemia, unspecified: Secondary | ICD-10-CM | POA: Diagnosis not present

## 2017-05-05 DIAGNOSIS — J439 Emphysema, unspecified: Secondary | ICD-10-CM

## 2017-05-05 DIAGNOSIS — G309 Alzheimer's disease, unspecified: Secondary | ICD-10-CM

## 2017-05-05 DIAGNOSIS — E119 Type 2 diabetes mellitus without complications: Secondary | ICD-10-CM | POA: Diagnosis not present

## 2017-05-05 DIAGNOSIS — E785 Hyperlipidemia, unspecified: Secondary | ICD-10-CM

## 2017-05-05 LAB — CMP14+EGFR
ALBUMIN: 3.8 g/dL (ref 3.5–4.7)
ALT: 7 IU/L (ref 0–44)
AST: 15 IU/L (ref 0–40)
Albumin/Globulin Ratio: 1.4 (ref 1.2–2.2)
Alkaline Phosphatase: 61 IU/L (ref 39–117)
BILIRUBIN TOTAL: 0.7 mg/dL (ref 0.0–1.2)
BUN / CREAT RATIO: 16 (ref 10–24)
BUN: 18 mg/dL (ref 8–27)
CALCIUM: 9.3 mg/dL (ref 8.6–10.2)
CO2: 24 mmol/L (ref 20–29)
CREATININE: 1.12 mg/dL (ref 0.76–1.27)
Chloride: 104 mmol/L (ref 96–106)
GFR, EST AFRICAN AMERICAN: 71 mL/min/{1.73_m2} (ref >59)
GFR, EST NON AFRICAN AMERICAN: 62 mL/min/{1.73_m2} (ref >59)
GLUCOSE: 134 mg/dL — AB (ref 65–99)
Globulin, Total: 2.7 g/dL (ref 1.5–4.5)
Potassium: 4.1 mmol/L (ref 3.5–5.2)
Sodium: 143 mmol/L (ref 134–144)
TOTAL PROTEIN: 6.5 g/dL (ref 6.0–8.5)

## 2017-05-05 NOTE — Patient Instructions (Signed)
Dementia Dementia means losing some of your brain ability. People with dementia may have problems with:  Memory.  Making decisions.  Behavior.  Speaking.  Thinking.  Solving problems.  Follow these instructions at home: Medicine  Take over-the-counter and prescription medicines only as told by your doctor.  Avoid taking medicines that can change how you think. These include pain or sleeping medicines. Lifestyle   Make healthy choices: ? Be active as told by your doctor. ? Do not use any tobacco products, such as cigarettes, chewing tobacco, and e-cigarettes. If you need help quitting, ask your doctor. ? Eat a healthy diet. ? When you get stressed, do something to help yourself relax. Your doctor can give you tips. ? Spend time with other people.  Drink enough fluid to keep your pee (urine) clear or pale yellow.  Make sure you get good sleep. Use these tips to help you get a good night's rest: ? Try not to take naps during the day. ? Keep your sleeping area dark and cool. ? In the few hours before you go to bed, try not to do any exercise. ? Try not to have foods and drinks with caffeine in the evening. General instructions  Talk with your doctor to figure out: ? What you need help with. ? What your safety needs are.  If you were given a bracelet that tracks your location, make sure to wear it.  Keep all follow-up visits as told by your doctor. This is important. Contact a doctor if:  You have any new problems.  You have problems with choking or swallowing.  You have any symptoms of a different sickness. Get help right away if:  You have a fever.  You feel mixed up (confused) or more mixed up than before.  You have new sleepiness.  You have sleepiness that gets worse.  You have a hard time staying awake.  You or your family members are worried for your safety. This information is not intended to replace advice given to you by your health care  provider. Make sure you discuss any questions you have with your health care provider. Document Released: 08/12/2008 Document Revised: 02/05/2016 Document Reviewed: 05/28/2015 Elsevier Interactive Patient Education  2018 [employer]vier Inc[employer]  

## 2017-05-05 NOTE — Progress Notes (Signed)
Subjective:    Patient ID: Ryan [name], male    DOB: [birth year], 82 y.o.   MRN: [id number]  Pt presents to the office today chronic follow up. PT is a poor historian and has dementia.Per EPIC pt has been seen by Cardiolgoists on 05/25/18with stable CAD, CHF, and cardiomyopathy with LVEF 45-50%.  Hypertension  This is a chronic problem. The current episode started more than 1 year ago. The problem has been resolved since onset. The problem is controlled. Associated symptoms include malaise/fatigue. Pertinent negatives include no blurred vision, headaches, palpitations, peripheral edema or shortness of breath. Risk factors for coronary artery disease include diabetes mellitus, dyslipidemia and sedentary lifestyle. The current treatment provides moderate improvement. Hypertensive end-organ damage includes CAD/MI and heart failure. There is no history of kidney disease.  Hyperlipidemia  This is a chronic problem. The current episode started more than 1 year ago. The problem is controlled. Recent lipid tests were reviewed and are normal. Pertinent negatives include no shortness of breath. Current antihyperlipidemic treatment includes statins. The current treatment provides moderate improvement of lipids. Risk factors for coronary artery disease include dyslipidemia, male sex and a sedentary lifestyle.  Diabetes  He presents for his follow-up diabetic visit. He has type 2 diabetes mellitus. His disease course has been stable. Hypoglycemia symptoms include dizziness. Pertinent negatives for hypoglycemia include no headaches. Pertinent negatives for diabetes include no blurred vision, no foot paresthesias and no visual change. There are no hypoglycemic complications. Symptoms are stable. Risk factors for coronary artery disease include dyslipidemia, male sex and obesity. He is following a generally healthy diet. (Does not check BS at home )  Anemia  Presents for follow-up visit. Symptoms include  malaise/fatigue. There has been no palpitations. Signs of blood loss that are not present include hematochezia and vaginal bleeding. Past medical history includes heart failure.  COPD PT taking Breo daily. Stable    Review of Systems  Constitutional: Positive for malaise/fatigue.  Eyes: Negative for blurred vision.  Respiratory: Negative for shortness of breath.   Cardiovascular: Negative for palpitations.  Gastrointestinal: Negative for hematochezia.  Genitourinary: Negative for vaginal bleeding.  Neurological: Positive for dizziness. Negative for headaches.  All other systems reviewed and are negative.      Objective:   Physical Exam  Constitutional: He is oriented to person, place, and time. He appears well-developed and well-nourished. No distress.  HENT:  Head: Normocephalic.  Right Ear: External ear normal.  Left Ear: External ear normal.  Nose: Nose normal.  Mouth/Throat: Oropharynx is clear and moist.  Eyes: Pupils are equal, round, and reactive to light. Right eye exhibits no discharge. Left eye exhibits no discharge.  Neck: Normal range of motion. Neck supple. No thyromegaly present.  Cardiovascular: Normal rate, regular rhythm, normal heart sounds and intact distal pulses.   No murmur heard. Pulmonary/Chest: Effort normal and breath sounds normal. No respiratory distress. He has no wheezes.  Abdominal: Soft. Bowel sounds are normal. He exhibits no distension. There is no tenderness.  Musculoskeletal: Normal range of motion. He exhibits no edema or tenderness.  Neurological: He is alert and oriented to person, place, and time.  Skin: Skin is warm and dry. No rash noted. No erythema.  Psychiatric: Judgment and thought content normal. His mood appears anxious. He is hyperactive. He exhibits abnormal recent memory.  Vitals reviewed.    BP 137/69   Pulse (!) 58   Temp (!) 96.9 F (36.1 C) (Oral)   Ht _0  (1.905 m)  Wt 181 lb 12.8 oz (82.5 kg)   BMI 22.72 kg/m        Assessment & Plan:  1. Essential hypertension, benign - CMP14+EGFR  2. Atherosclerosis of native coronary artery of native heart with angina pectoris ([place]) - CMP14+EGFR  3. Acute diastolic heart failure, NYHA class 1 (HCC) - CMP14+EGFR  4. Pulmonary emphysema, unspecified emphysema type (HCC) - CMP14+EGFR  5. Controlled type 2 diabetes mellitus without complication, without long-term current use of insulin ([place]) - Ambulatory referral to Ophthalmology - CMP14+EGFR  6. Hyperlipidemia, unspecified hyperlipidemia type - CMP14+EGFR  7. Alzheimer's dementia without behavioral disturbance, unspecified timing of dementia onset - CMP14+EGFR  8. Normocytic anemia - CMP14+EGFR  9. Vitamin B12 deficiency - CMP14+EGFR   Continue all meds Labs pending Health Maintenance reviewed Diet and exercise encouraged RTO 3 months   Evelina Dun, FNP

## 2017-05-11 ENCOUNTER — Telehealth: Payer: Self-pay | Admitting: Family

## 2017-05-21 ENCOUNTER — Other Ambulatory Visit: Payer: Self-pay | Admitting: Family

## 2017-05-21 DIAGNOSIS — I10 Essential (primary) hypertension: Secondary | ICD-10-CM

## 2017-05-21 DIAGNOSIS — I5031 Acute diastolic (congestive) heart failure: Secondary | ICD-10-CM

## 2017-06-29 ENCOUNTER — Other Ambulatory Visit: Payer: Self-pay | Admitting: Family

## 2017-07-29 ENCOUNTER — Ambulatory Visit: Payer: Medicare Other | Admitting: Nurse Practitioner

## 2017-07-29 ENCOUNTER — Ambulatory Visit (INDEPENDENT_AMBULATORY_CARE_PROVIDER_SITE_OTHER): Payer: Medicare Other | Admitting: Family Medicine

## 2017-07-29 ENCOUNTER — Telehealth: Payer: Self-pay | Admitting: Family Medicine

## 2017-07-29 ENCOUNTER — Encounter: Payer: Self-pay | Admitting: Family Medicine

## 2017-07-29 VITALS — BP 129/65 | HR 63 | Temp 96.9°F

## 2017-07-29 DIAGNOSIS — N3000 Acute cystitis without hematuria: Secondary | ICD-10-CM | POA: Diagnosis not present

## 2017-07-29 DIAGNOSIS — R6883 Chills (without fever): Secondary | ICD-10-CM | POA: Diagnosis not present

## 2017-07-29 DIAGNOSIS — R6889 Other general symptoms and signs: Secondary | ICD-10-CM

## 2017-07-29 LAB — URINALYSIS, COMPLETE
BILIRUBIN UA: NEGATIVE
GLUCOSE, UA: NEGATIVE
NITRITE UA: NEGATIVE
RBC UA: NEGATIVE
SPEC GRAV UA: 1.025 (ref 1.005–1.030)
UUROB: 1 mg/dL (ref 0.2–1.0)
pH, UA: 5.5 (ref 5.0–7.5)

## 2017-07-29 LAB — VERITOR FLU A/B WAIVED
INFLUENZA B: NEGATIVE
Influenza A: NEGATIVE

## 2017-07-29 LAB — MICROSCOPIC EXAMINATION
Epithelial Cells (non renal): NONE SEEN /hpf (ref 0–10)
RBC MICROSCOPIC, UA: NONE SEEN /HPF (ref 0–?)
Renal Epithel, UA: NONE SEEN /hpf

## 2017-07-29 MED ORDER — CEPHALEXIN 500 MG PO CAPS: 500.0000 mg | ORAL_CAPSULE | Freq: Four times a day (QID) | ORAL | 0 refills | Status: DC

## 2017-07-29 NOTE — Telephone Encounter (Signed)
Pt. Aware.

## 2017-07-29 NOTE — Progress Notes (Signed)
BP 129/65 (BP Location: Left Arm, Patient Position: Sitting, Cuff Size: Normal)   Pulse 63   Temp (!) 96.9 F (36.1 C) (Oral)    Subjective:    Patient ID: Ryan [name], male    DOB: [birth year], 82 y.o.   MRN: [id number]  HPI: Ryan [name] is a 82 y.o. male presenting on 07/29/2017 for cold intolerance   HPI Cold intolerance and chills and inability to get warm Comes in complaining of cold intolerance and chills and inability get warm.  He says is been feeling like this over the past couple days.  He denies any cough but does have a little bit of nasal congestion.  He denies any shortness of breath or wheezing.  He denies any abdominal pain or dysuria or hematuria.  He just says that he feels chills and achy and just cannot get warm.  He denies any sick contacts that he knows of.  He does admit that he keeps his home thermostat at 55 degrees and it is wintertime and has been really cold over the last couple days.  Relevant past medical, surgical, family and social history reviewed and updated as indicated. Interim medical history since our last visit reviewed. Allergies and medications reviewed and updated.  Review of Systems  Constitutional: Positive for chills. Negative for fever.  HENT: Positive for congestion. Negative for rhinorrhea, sinus pressure, sinus pain and sore throat.   Eyes: Negative for discharge.  Respiratory: Negative for cough, shortness of breath and wheezing.   Cardiovascular: Negative for chest pain and leg swelling.  Gastrointestinal: Negative for abdominal pain, constipation and diarrhea.  Endocrine: Positive for cold intolerance.  Genitourinary: Negative for dysuria, flank pain, hematuria and urgency.  Musculoskeletal: Negative for back pain and gait problem.  Skin: Negative for rash.  All other systems reviewed and are negative.   Per HPI unless specifically indicated above        Objective:    BP 129/65 (BP Location: Left Arm,  Patient Position: Sitting, Cuff Size: Normal)   Pulse 63   Temp (!) 96.9 F (36.1 C) (Oral)   Wt Readings from Last 3 Encounters:  05/05/17 181 lb 12.8 oz (82.5 kg)  02/04/17 183 lb (83 kg)  02/01/17 182 lb 9.6 oz (82.8 kg)    Physical Exam  Constitutional: He is oriented to person, place, and time. He appears well-developed and well-nourished. No distress.  Eyes: Conjunctivae are normal. No scleral icterus.  Cardiovascular: Normal rate, regular rhythm, normal heart sounds and intact distal pulses.  No murmur heard. Pulmonary/Chest: Effort normal and breath sounds normal. No respiratory distress. He has no wheezes. He has no rales.  Abdominal: Soft. Bowel sounds are normal. He exhibits no distension. There is no tenderness. There is no rebound and no guarding.  Musculoskeletal: Normal range of motion. He exhibits no edema.  Neurological: He is alert and oriented to person, place, and time. Coordination normal.  Skin: Skin is warm and dry. No rash noted. He is not diaphoretic.  Nursing note and vitals reviewed.   Urinalysis: 6-10 WBCs, few bacteria, mucus present, trace ketones    Assessment & Plan:   Problem List Items Addressed This Visit    None    Visit Diagnoses    Cold intolerance    -  Primary   Relevant Orders   TSH   Veritor Flu A/B Waived   Urinalysis, Complete   Chills       Relevant Orders   CBC with Differential/Platelet  Urinalysis, Complete   Acute cystitis without hematuria       Relevant Medications   cephALEXin (KEFLEX) 500 MG capsule       Follow up plan: Return if symptoms worsen or fail to improve.  Counseling provided for all of the vaccine components Orders Placed This Encounter  Procedures  . CBC with Differential/Platelet  . TSH  . Veritor Flu A/B Waived  . Urinalysis, Complete    Arville Care, MD [facility] 07/29/2017, 11:55 AM

## 2017-07-29 NOTE — Telephone Encounter (Signed)
Pt aware.

## 2017-07-29 NOTE — Telephone Encounter (Signed)
Patient's urinalysis came back positive, will send antibiotic for him

## 2017-07-30 LAB — CBC WITH DIFFERENTIAL/PLATELET
BASOS ABS: 0 10*3/uL (ref 0.0–0.2)
Basos: 1 %
EOS (ABSOLUTE): 0.5 10*3/uL — AB (ref 0.0–0.4)
Eos: 9 %
Hematocrit: 37 % — ABNORMAL LOW (ref 37.5–51.0)
Hemoglobin: 12.9 g/dL — ABNORMAL LOW (ref 13.0–17.7)
Immature Grans (Abs): 0 10*3/uL (ref 0.0–0.1)
Immature Granulocytes: 0 %
LYMPHS ABS: 1.8 10*3/uL (ref 0.7–3.1)
Lymphs: 31 %
MCH: 30.9 pg (ref 26.6–33.0)
MCHC: 34.9 g/dL (ref 31.5–35.7)
MCV: 89 fL (ref 79–97)
MONOS ABS: 0.6 10*3/uL (ref 0.1–0.9)
Monocytes: 10 %
Neutrophils Absolute: 2.9 10*3/uL (ref 1.4–7.0)
Neutrophils: 49 %
Platelets: 168 10*3/uL (ref 150–379)
RBC: 4.18 x10E6/uL (ref 4.14–5.80)
RDW: 14.4 % (ref 12.3–15.4)
WBC: 5.9 10*3/uL (ref 3.4–10.8)

## 2017-07-30 LAB — TSH: TSH: 1.81 u[IU]/mL (ref 0.450–4.500)

## 2017-08-01 ENCOUNTER — Encounter: Payer: Self-pay | Admitting: Family

## 2017-08-01 ENCOUNTER — Ambulatory Visit (INDEPENDENT_AMBULATORY_CARE_PROVIDER_SITE_OTHER): Payer: Medicare Other | Admitting: Family

## 2017-08-01 VITALS — BP 112/58 | HR 73 | Temp 98.1°F | Ht 75.0 in | Wt 172.4 lb

## 2017-08-01 DIAGNOSIS — F32A Depression, unspecified: Secondary | ICD-10-CM

## 2017-08-01 DIAGNOSIS — F329 Major depressive disorder, single episode, unspecified: Secondary | ICD-10-CM

## 2017-08-01 MED ORDER — ESCITALOPRAM OXALATE 5 MG PO TABS: 5.0000 mg | ORAL_TABLET | Freq: Every day | ORAL | 1 refills | Status: DC

## 2017-08-01 NOTE — Patient Instructions (Signed)

## 2017-08-01 NOTE — Progress Notes (Signed)
Cardiology Office Note  Date: 08/03/2017   ID: Ryan [name], DOB April 15, 1935, MRN [id number]  PCP: Ryan SpencerHawks, Christy A, FNP  Primary Cardiologist: Ryan DellSamuel Maralee Higuchi, MD   Chief Complaint  Patient presents with  . Coronary Artery Disease    History of Present Illness: Ryan [name] is an 82 y.o. male last seen in May.  He presents for [name] routine follow-up visit.  He continues to follow with PCP on [name] regular basis, I reviewed the recent notes.  I see that there are concerns about depression and he has been started on Lexapro with referral to Ryan [name].  From [name] cardiac perspective he does not specifically report chest pain or palpitations.  He has chronic shortness of breath which has not changed recently.  He states that he is unsteady on his feet but uses [name] cane and has had no recent falls.  We have been managing him conservatively with [name] history of ischemic heart disease and cardiomyopathy.  Current medical regimen includes aspirin, Lipitor, Lasix, hydralazine, and Ryan [name].  He has an ACE inhibitor allergy and also with relatively slow heart rates is not on [name] beta-blocker.  Past Medical History:  Diagnosis Date  . Abnormal ECG   . Cardiomyopathy (HCC)    LVEF 45-50%  . Coronary atherosclerosis of native coronary artery    Based on Myoview - managing medically  . Dementia   . Essential hypertension, benign   . Type 2 diabetes mellitus (HCC)   . Vitamin B12 deficiency 05/01/2016    Past Surgical History:  Procedure Laterality Date  . EYE SURGERY Bilateral 08/2016   [place]  . HERNIA REPAIR    . HIP SURGERY    . KNEE SURGERY    . PROSTATE SURGERY      Current Outpatient Medications  Medication Sig Dispense Refill  . albuterol (PROVENTIL HFA;VENTOLIN HFA) 108 (90 Base) MCG/ACT inhaler Inhale 2 puffs into the lungs every 6 (six) hours as needed for wheezing or shortness of breath. 1 Inhaler 2  . Ascorbic Acid (VITAMIN C) 100 MG tablet Take 500 mg  by mouth 2 (two) times daily.     Marland Kitchen. aspirin EC 81 MG tablet Take 81 mg by mouth every other day.     Marland Kitchen. atorvastatin (LIPITOR) 40 MG tablet TAKE ONE TABLET BY MOUTH ONCE DAILY 90 tablet 1  . escitalopram (LEXAPRO) 5 MG tablet Take 1 tablet (5 mg total) daily by mouth. 90 tablet 1  . fluticasone furoate-vilanterol (BREO ELLIPTA) 100-25 MCG/INH AEPB Inhale 1 puff into the lungs daily. 60 each 3  . furosemide (LASIX) 20 MG tablet TAKE ONE TABLET BY MOUTH ONCE DAILY AT NOON 30 tablet 6  . Garlic 1000 MG CAPS Take 3 capsules by mouth every morning.     . Ginger 500 MG CAPS Take 1 capsule by mouth daily.     . hydrALAZINE (APRESOLINE) 25 MG tablet TAKE 1 TABLET BY MOUTH TWICE DAILY 60 tablet 4  . ibuprofen (ADVIL,MOTRIN) 200 MG tablet Take 400 mg by mouth every 6 (six) hours as needed for moderate pain.    . isosorbide mononitrate (IMDUR) 30 MG 24 hr tablet TAKE ONE-HALF TABLET BY MOUTH ONCE DAILY 45 tablet 0  . Memantine HCl-Donepezil HCl (NAMZARIC) 28-10 MG CP24 Take 1 tablet by mouth daily. 90 capsule 1  . Multiple Vitamin (VITAMIN E/FOLIC ACID/B-6/B-12 PO) Take 10 mg daily by mouth.    . potassium chloride (K-DUR) 10 MEQ tablet TAKE ONE TABLET  BY MOUTH ONCE DAILY 90 tablet 3  . vitamin B-12 (CYANOCOBALAMIN) 1000 MCG tablet Take 0.5 tablets (500 mcg total) by mouth daily. (Patient taking differently: Take 1,000 mcg by mouth 3 (three) times daily. )     No current facility-administered medications for this visit.    Allergies:  Lisinopril   Social History: The patient  reports that he quit smoking about 50 years ago. His smoking use included cigarettes. He started smoking about 66 years ago. He has [name] 15.00 pack-year smoking history. he has never used smokeless tobacco. He reports that he does not drink alcohol or use drugs.   ROS:  Please see the history of present illness. Otherwise, complete review of systems is positive for hearing loss.  All other systems are reviewed and negative.   Physical  Exam: VS:  BP (!) 148/82   Pulse 62   Ht 6\' 3"  (1.905 Ryan)   Wt 171 lb 3.2 oz (77.7 kg)   SpO2 96%   BMI 21.40 kg/Ryan , BMI Body mass index is 21.4 kg/Ryan.  Wt Readings from Last 3 Encounters:  08/03/17 171 lb 3.2 oz (77.7 kg)  08/01/17 172 lb 6.4 oz (78.2 kg)  05/05/17 181 lb 12.8 oz (82.5 kg)    [name]: Elderly male, no distress.  Using [name] cane. HEENT: Conjunctiva and lids normal, wearing [name] respiratory mask. Neck: Supple, no elevated JVP or carotid bruits, no thyromegaly. Lungs: No wheezing, nonlabored breathing at rest. Cardiac: Regular rate and rhythm, no S3, soft systolic murmur. Abdomen: Soft, nontender, bowel sounds present. Extremities: Mild ankle edema, distal pulses 1-2+. Skin: Warm and dry. Musculoskeletal: No kyphosis. Neuropsychiatric: Alert and oriented x3.  ECG: I personally reviewed the tracing from 04/30/2016 which showed sinus rhythm with diffuse ST-T wave abnormalities, possible old inferior infarct pattern.  Recent Labwork: 05/05/2017: ALT 7; AST 15; BUN 18; Creatinine, Ser 1.12; Potassium 4.1; Sodium 143 07/29/2017: Hemoglobin 12.9; Platelets 168; TSH 1.810     Component Value Date/Time   CHOL 134 02/01/2017 0937   TRIG 147 02/01/2017 0937   TRIG 210 (H) 06/24/2014 1424   HDL 44 02/01/2017 0937   HDL 45 06/24/2014 1424   CHOLHDL 3.0 02/01/2017 0937   CHOLHDL 3.0 07/08/2012 0551   VLDL 22 07/08/2012 0551   LDLCALC 61 02/01/2017 0937   LDLCALC 72 06/24/2014 1424    Other Studies Reviewed Today:  Echocardiogram 05/02/2015: Study Conclusions  - Left ventricle: Technically difficult study. There is hypokinesis  of base inferior segment. Inferolateral wall difficult to assess.  There is hypokinesis of this wall. The EF is 45-50% The cavity  size was normal. Wall thickness was increased in [name] pattern of  mild LVH. - Mitral valve: Calcified annulus. There was no significant  regurgitation. - Right ventricle: The cavity size was normal. Systolic  function  was normal.  Assessment and Plan:  1.  Ischemic heart disease based on previous noninvasive testing.  We are continuing with medical therapy and overall conservative management.  He does not indicate any progressive angina symptoms.  No changes were made today.  2.  Mild left ventricular dysfunction based on previous workup with LVEF 45-50%.  Weight has been stable.  He continues on Lasix as well as hydralazine and Imdur.  No beta-blocker with low heart rates and he has an ACE inhibitor allergy.  3.  Essential hypertension, no changes made to present regimen.  4.  Type 2 diabetes mellitus, followed by PCP.  Current medicines were reviewed with the patient  today.   Orders Placed This Encounter  Procedures  . EKG 12-Lead    Disposition: Follow-up in 6 months.  Signed, Jonelle Sidle, MD, [facility] 08/03/2017 9:12 AM    [name] at [name] [address] [place], [place], [place] [address] Phone: [phone]; Fax: [phone]
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
## 2017-08-03 ENCOUNTER — Encounter: Payer: Self-pay | Admitting: Cardiology

## 2017-08-03 ENCOUNTER — Ambulatory Visit (INDEPENDENT_AMBULATORY_CARE_PROVIDER_SITE_OTHER): Payer: Medicare Other | Admitting: Cardiology

## 2017-08-03 VITALS — BP 148/82 | HR 62 | Ht 75.0 in | Wt 171.2 lb

## 2017-08-03 DIAGNOSIS — I10 Essential (primary) hypertension: Secondary | ICD-10-CM | POA: Diagnosis not present

## 2017-08-03 DIAGNOSIS — I5042 Chronic combined systolic (congestive) and diastolic (congestive) heart failure: Secondary | ICD-10-CM | POA: Diagnosis not present

## 2017-08-03 DIAGNOSIS — I2510 Atherosclerotic heart disease of native coronary artery without angina pectoris: Secondary | ICD-10-CM

## 2017-08-03 DIAGNOSIS — E119 Type 2 diabetes mellitus without complications: Secondary | ICD-10-CM | POA: Diagnosis not present

## 2017-08-03 NOTE — Patient Instructions (Signed)

## 2017-08-09 ENCOUNTER — Ambulatory Visit: Payer: Medicare Other | Admitting: Family

## 2017-08-25 ENCOUNTER — Other Ambulatory Visit: Payer: Self-pay | Admitting: *Deleted

## 2017-08-25 DIAGNOSIS — I5031 Acute diastolic (congestive) heart failure: Secondary | ICD-10-CM

## 2017-08-25 DIAGNOSIS — I10 Essential (primary) hypertension: Secondary | ICD-10-CM

## 2017-08-25 MED ORDER — HYDRALAZINE HCL 25 MG PO TABS: 25.0000 mg | ORAL_TABLET | Freq: Two times a day (BID) | ORAL | 1 refills | Status: DC

## 2017-08-25 MED ORDER — FUROSEMIDE 20 MG PO TABS: ORAL_TABLET | ORAL | 1 refills | Status: DC

## 2017-09-05 ENCOUNTER — Other Ambulatory Visit: Payer: Self-pay | Admitting: Cardiology

## 2017-09-05 MED ORDER — POTASSIUM CHLORIDE ER 10 MEQ PO TBCR: 10.0000 meq | EXTENDED_RELEASE_TABLET | Freq: Every day | ORAL | 3 refills | Status: AC

## 2017-09-05 NOTE — Telephone Encounter (Signed)
Done

## 2017-09-05 NOTE — Telephone Encounter (Signed)
° °  1. Which medications need to be refilled? (please list name of each medication and dose if known)  Potassium Chloride   2. Which pharmacy/location (including street and city if local pharmacy) is medication to be sent to?  3. Do they need a 30 day or 90 day supply?

## 2017-09-19 ENCOUNTER — Encounter: Payer: Self-pay | Admitting: Family

## 2017-09-19 ENCOUNTER — Ambulatory Visit (INDEPENDENT_AMBULATORY_CARE_PROVIDER_SITE_OTHER): Payer: Medicare Other | Admitting: Family

## 2017-09-19 VITALS — BP 134/71 | HR 60 | Temp 96.9°F | Ht 75.0 in | Wt 171.6 lb

## 2017-09-19 DIAGNOSIS — R5383 Other fatigue: Secondary | ICD-10-CM | POA: Diagnosis not present

## 2017-09-19 DIAGNOSIS — F0280 Dementia in other diseases classified elsewhere without behavioral disturbance: Secondary | ICD-10-CM

## 2017-09-19 DIAGNOSIS — I429 Cardiomyopathy, unspecified: Secondary | ICD-10-CM | POA: Diagnosis not present

## 2017-09-19 DIAGNOSIS — Z9181 History of falling: Secondary | ICD-10-CM | POA: Insufficient documentation

## 2017-09-19 DIAGNOSIS — G309 Alzheimer's disease, unspecified: Secondary | ICD-10-CM | POA: Diagnosis not present

## 2017-09-19 DIAGNOSIS — F320 Major depressive disorder, single episode, mild: Secondary | ICD-10-CM | POA: Diagnosis not present

## 2017-09-19 NOTE — Progress Notes (Signed)
   Subjective:    Patient ID: Ryan [name], male    DOB: 13-Dec-1934, 83 y.o.   MRN: [id number]  Pt presents to the office today to recheck depression. Pt states he feels good, but "takes  Every now and then". Pt states he "takes all my other medications regularly".  Pt states he does not feel sad anymore.   Pt states he feels tired and "very cold". States he has to rest if he walks any distance.   Depression         This is a new problem.  The current episode started more than 1 year ago.   The problem occurs intermittently.  Associated symptoms include fatigue and sad.  Associated symptoms include no helplessness, no hopelessness, not irritable, no restlessness and no suicidal ideas.  Past treatments include SSRIs - Selective serotonin reuptake inhibitors.  Compliance with treatment is good.     Review of Systems  Constitutional: Positive for fatigue.  Neurological: Positive for dizziness.  Psychiatric/Behavioral: Positive for depression. Negative for suicidal ideas. The patient is nervous/anxious.   All other systems reviewed and are negative.      Objective:   Physical Exam  Constitutional: He is oriented to person, place, and time. He appears well-developed and well-nourished. He is not irritable. No distress.  HENT:  Head: Normocephalic.  Eyes: Pupils are equal, round, and reactive to light. Right eye exhibits no discharge. Left eye exhibits no discharge.  Neck: Normal range of motion. Neck supple. No thyromegaly present.  Cardiovascular: Normal rate, regular rhythm, normal heart sounds and intact distal pulses.  No murmur heard. Pulmonary/Chest: Effort normal and breath sounds normal. No respiratory distress. He has no wheezes.  Abdominal: Soft. Bowel sounds are normal. He exhibits no distension. There is no tenderness.  Musculoskeletal: He exhibits no edema or tenderness.  Generalized weakness, using a cane to walk  Neurological: He is alert and oriented to person,  place, and time.  Skin: Skin is warm and dry. No rash noted. No erythema.  Psychiatric: He has a normal mood and affect. His behavior is normal. Judgment and thought content normal.  Vitals reviewed.   BP 134/71   Pulse 60   Temp (!) 96.9 F (36.1 C) (Oral)   Ht '6\' 3"'$  (1.905 m)   Wt 171 lb 9.6 oz (77.8 kg)   BMI 21.45 kg/m      Assessment & Plan:  1. Alzheimer's dementia without behavioral disturbance, unspecified timing of dementia onset - Home Health - Face-to-face encounter (required for Medicare/Medicaid patients) - CMP14+EGFR  2. At high risk for falls - Home Health - Face-to-face encounter (required for Medicare/Medicaid patients) - CMP14+EGFR  3. Depression, major, single episode, mild ([place]) - Home Health - Face-to-face encounter (required for Medicare/Medicaid patients) - CMP14+EGFR  4. Fatigue, unspecified type - Anemia Profile B - CMP14+EGFR  Will send Home Health out to evaluate patient. He is a poor historian and can not recall what medications he is taking. I am worried he is taking too many or skipping days of his medications. Pt is a [place] risks and complaining of fatigue and dizziness. We will do lab work today, but worried that this could be caused by him not taking his medications appropriately. RTO in 1 month  Evelina Dun, [place]

## 2017-09-19 NOTE — Patient Instructions (Signed)

## 2017-09-20 LAB — CMP14+EGFR
ALT: 13 IU/L (ref 0–44)
AST: 21 IU/L (ref 0–40)
Albumin/Globulin Ratio: 1.2 (ref 1.2–2.2)
Albumin: 3.7 g/dL (ref 3.5–4.7)
Alkaline Phosphatase: 60 IU/L (ref 39–117)
BUN / CREAT RATIO: 15 (ref 10–24)
BUN: 17 mg/dL (ref 8–27)
Bilirubin Total: 0.6 mg/dL (ref 0.0–1.2)
CO2: 24 mmol/L (ref 20–29)
CREATININE: 1.11 mg/dL (ref 0.76–1.27)
Calcium: 9.3 mg/dL (ref 8.6–10.2)
Chloride: 104 mmol/L (ref 96–106)
GFR, EST AFRICAN AMERICAN: 72 mL/min/{1.73_m2} (ref >59)
GFR, EST NON AFRICAN AMERICAN: 62 mL/min/{1.73_m2} (ref >59)
GLUCOSE: 105 mg/dL — AB (ref 65–99)
Globulin, Total: 3.1 g/dL (ref 1.5–4.5)
Potassium: 4.7 mmol/L (ref 3.5–5.2)
Sodium: 141 mmol/L (ref 134–144)
TOTAL PROTEIN: 6.8 g/dL (ref 6.0–8.5)

## 2017-09-20 LAB — ANEMIA PROFILE B
BASOS ABS: 0 10*3/uL (ref 0.0–0.2)
Basos: 0 %
EOS (ABSOLUTE): 0.1 10*3/uL (ref 0.0–0.4)
EOS: 2 %
Ferritin: 152 ng/mL (ref 30–400)
Folate: 8.6 ng/mL (ref >3.0)
HEMOGLOBIN: 12.4 g/dL — AB (ref 13.0–17.7)
Hematocrit: 37.1 % — ABNORMAL LOW (ref 37.5–51.0)
IMMATURE GRANULOCYTES: 0 %
IRON SATURATION: 33 % (ref 15–55)
IRON: 85 ug/dL (ref 38–169)
Immature Grans (Abs): 0 10*3/uL (ref 0.0–0.1)
Lymphocytes Absolute: 2.5 10*3/uL (ref 0.7–3.1)
Lymphs: 37 %
MCH: 30.3 pg (ref 26.6–33.0)
MCHC: 33.4 g/dL (ref 31.5–35.7)
MCV: 91 fL (ref 79–97)
MONOCYTES: 7 %
Monocytes Absolute: 0.4 10*3/uL (ref 0.1–0.9)
NEUTROS ABS: 3.7 10*3/uL (ref 1.4–7.0)
Neutrophils: 54 %
PLATELETS: 140 10*3/uL — AB (ref 150–379)
RBC: 4.09 x10E6/uL — ABNORMAL LOW (ref 4.14–5.80)
RDW: 15.1 % (ref 12.3–15.4)
RETIC CT PCT: 0.8 % (ref 0.6–2.6)
TIBC: 257 ug/dL (ref 250–450)
UIBC: 172 ug/dL (ref 111–343)
Vitamin B-12: 422 pg/mL (ref 232–1245)
WBC: 6.8 10*3/uL (ref 3.4–10.8)

## 2017-09-21 DIAGNOSIS — I110 Hypertensive heart disease with heart failure: Secondary | ICD-10-CM | POA: Diagnosis not present

## 2017-09-21 DIAGNOSIS — I5043 Acute on chronic combined systolic (congestive) and diastolic (congestive) heart failure: Secondary | ICD-10-CM | POA: Diagnosis not present

## 2017-09-21 DIAGNOSIS — G309 Alzheimer's disease, unspecified: Secondary | ICD-10-CM | POA: Diagnosis not present

## 2017-09-22 ENCOUNTER — Telehealth: Payer: Self-pay | Admitting: Family

## 2017-09-22 DIAGNOSIS — G309 Alzheimer's disease, unspecified: Secondary | ICD-10-CM | POA: Diagnosis not present

## 2017-09-22 DIAGNOSIS — I5043 Acute on chronic combined systolic (congestive) and diastolic (congestive) heart failure: Secondary | ICD-10-CM | POA: Diagnosis not present

## 2017-09-22 DIAGNOSIS — I110 Hypertensive heart disease with heart failure: Secondary | ICD-10-CM | POA: Diagnosis not present

## 2017-09-23 ENCOUNTER — Other Ambulatory Visit: Payer: Self-pay | Admitting: Family

## 2017-09-28 ENCOUNTER — Telehealth: Payer: Self-pay | Admitting: *Deleted

## 2017-09-28 DIAGNOSIS — F0280 Dementia in other diseases classified elsewhere without behavioral disturbance: Secondary | ICD-10-CM

## 2017-09-28 DIAGNOSIS — I5043 Acute on chronic combined systolic (congestive) and diastolic (congestive) heart failure: Secondary | ICD-10-CM | POA: Diagnosis not present

## 2017-09-28 DIAGNOSIS — G309 Alzheimer's disease, unspecified: Secondary | ICD-10-CM | POA: Diagnosis not present

## 2017-09-28 DIAGNOSIS — F320 Major depressive disorder, single episode, mild: Secondary | ICD-10-CM

## 2017-09-28 DIAGNOSIS — I110 Hypertensive heart disease with heart failure: Secondary | ICD-10-CM | POA: Diagnosis not present

## 2017-09-28 NOTE — Addendum Note (Signed)
Addended by: Jannifer Rodney A on: 09/28/2017 02:04 PM   Modules accepted: Orders
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
## 2017-09-28 NOTE — Telephone Encounter (Signed)
[facility] Nurse Winona Legato aware referral has been placed for patient

## 2017-09-28 NOTE — Telephone Encounter (Signed)
Referral to [facility] placed. If patient is having any suicidal  or homicidal thoughts he needs to go to ED.

## 2017-10-04 DIAGNOSIS — I5043 Acute on chronic combined systolic (congestive) and diastolic (congestive) heart failure: Secondary | ICD-10-CM | POA: Diagnosis not present

## 2017-10-04 DIAGNOSIS — I110 Hypertensive heart disease with heart failure: Secondary | ICD-10-CM | POA: Diagnosis not present

## 2017-10-04 DIAGNOSIS — G309 Alzheimer's disease, unspecified: Secondary | ICD-10-CM | POA: Diagnosis not present

## 2017-10-05 IMAGING — CT CT ANGIO CHEST
2 of 6 series · 18 of 46 positions shown · IV contrast (ISOVUE)
Comparison: Chest x-ray 02/06/2016 and 01/29/2016

CLINICAL DATA: Shortness of breath 2 weeks.

EXAM:
CT ANGIOGRAPHY CHEST WITH CONTRAST
TECHNIQUE: Multidetector CT imaging of the chest was performed using the
standard protocol during bolus administration of intravenous
contrast. Multiplanar CT image reconstructions and MIPs were
obtained to evaluate the vascular anatomy.
CONTRAST:  100 mL Isovue 370 IV

[Series 6: pe thins 1.0 · axial · 0.71mm/px · z∈[-18,+266]mm · 15 of 316 slices shown]
[im 16/316  lung]
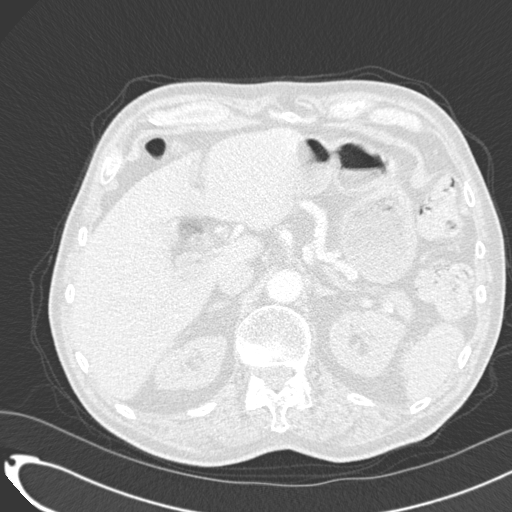
[im 32/316  soft-tissue]
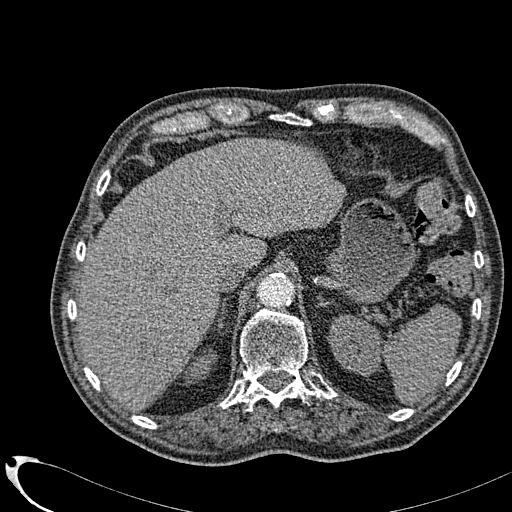
[im 64/316  lung]
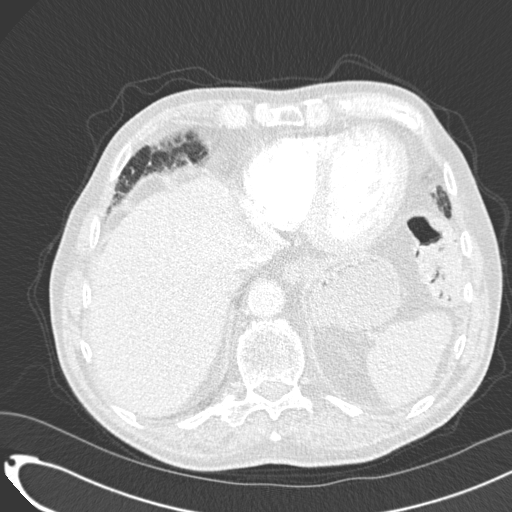
[im 79/316  soft-tissue]
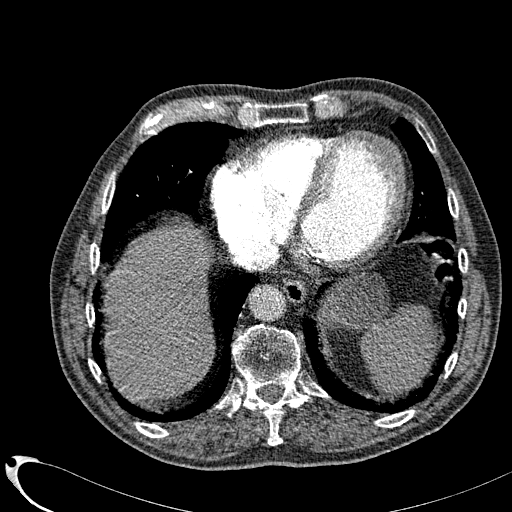
[im 95/316  lung]
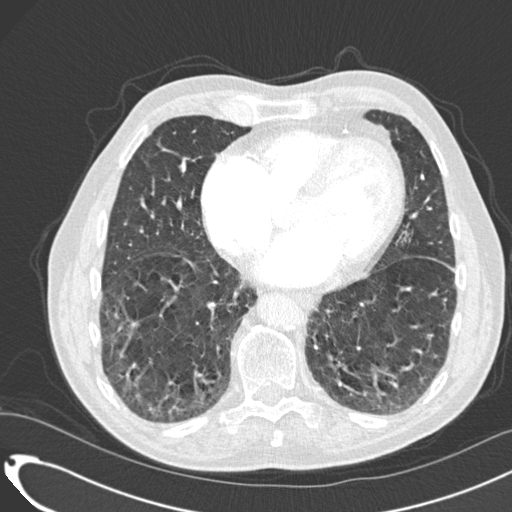
[im 111/316  soft-tissue]
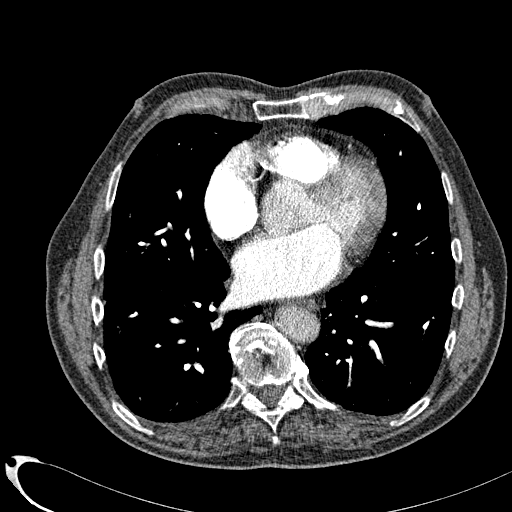
[im 142/316  lung]
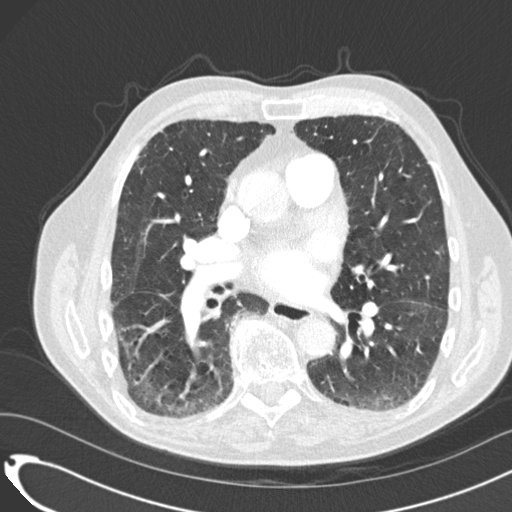
[im 158/316  soft-tissue]
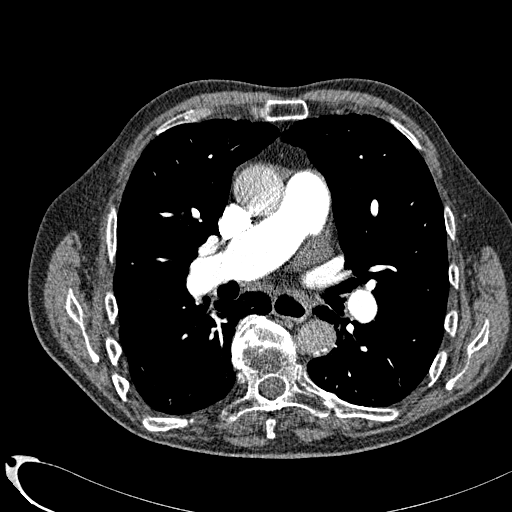
[im 174/316  lung]
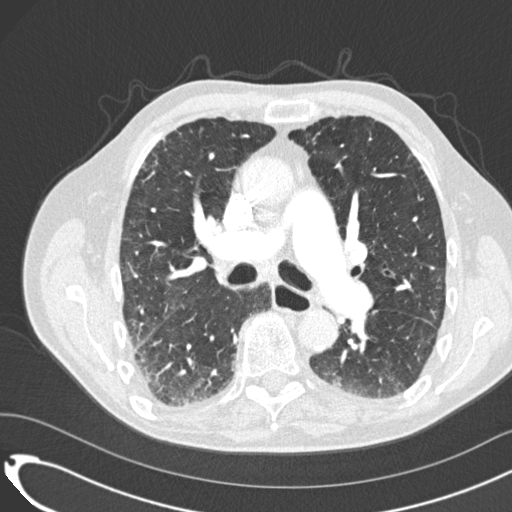
[im 205/316  soft-tissue]
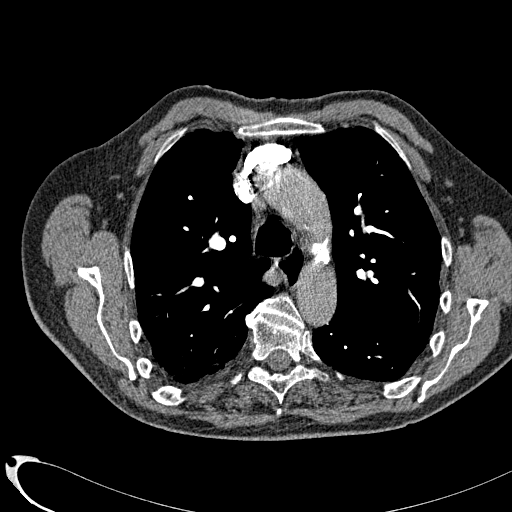
[im 221/316  lung]
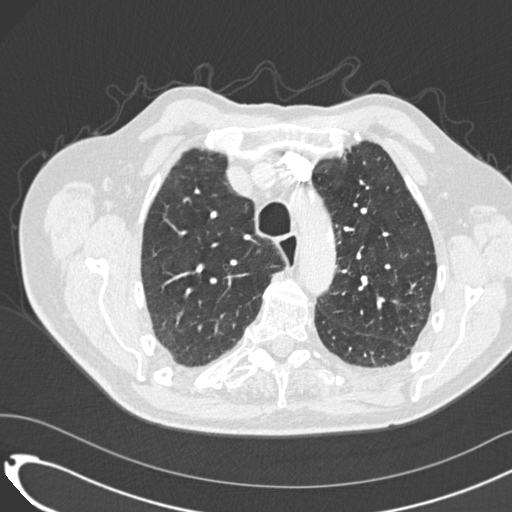
[im 237/316  soft-tissue]
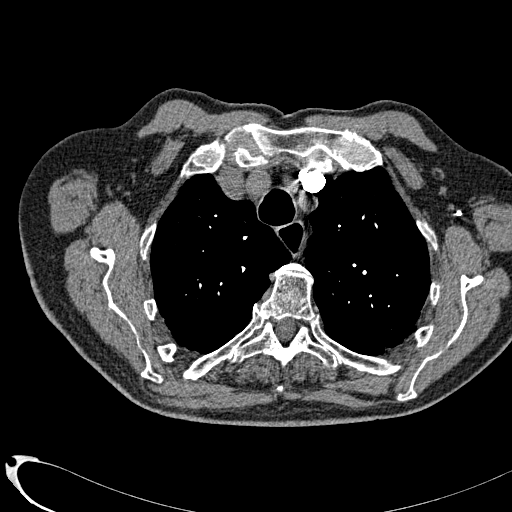
[im 253/316  lung]
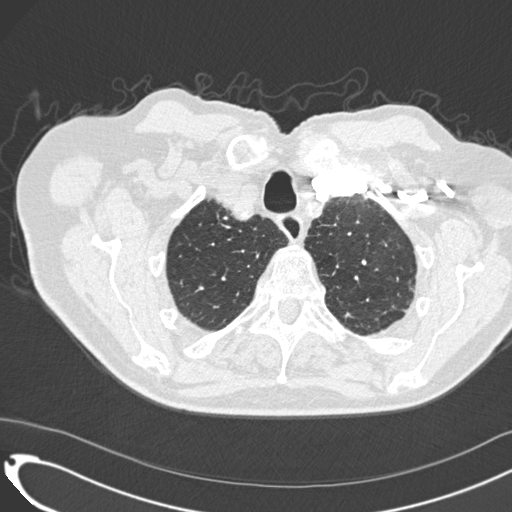
[im 284/316  soft-tissue]
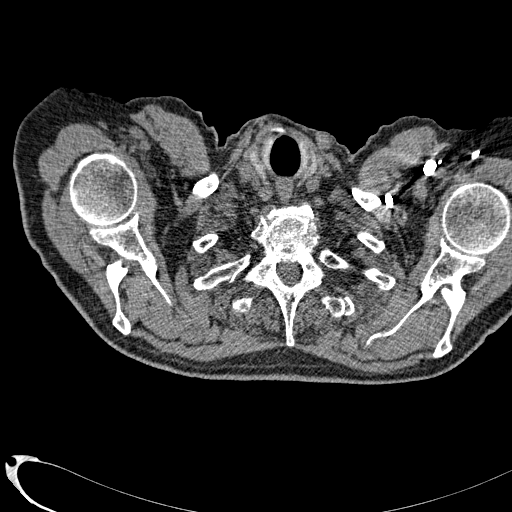
[im 300/316  lung]
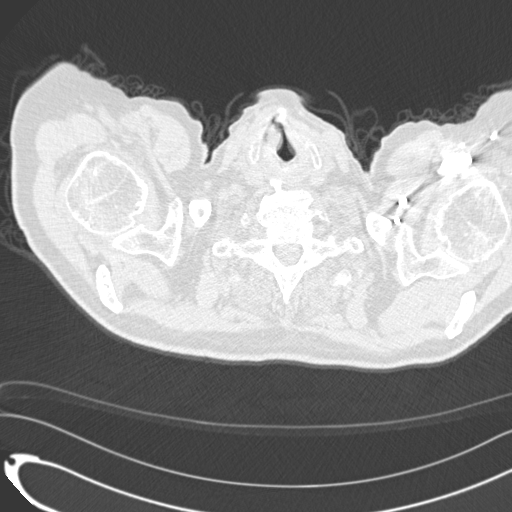

[Series 8: cor mpr 2.0 · coronal · 0.59mm/px · 3 of 131 slices shown]
[im 33/131  soft-tissue]
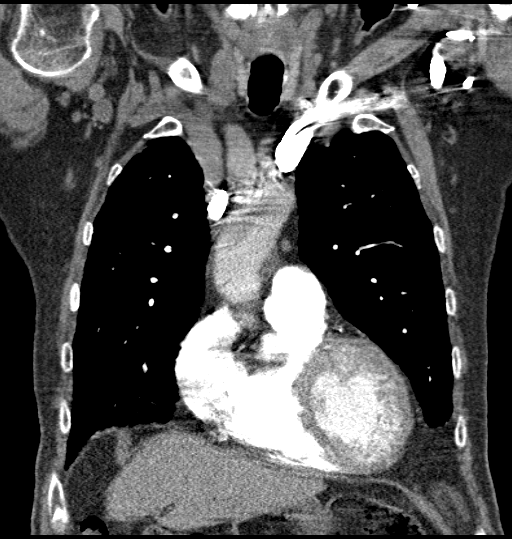
[im 66/131  soft-tissue]
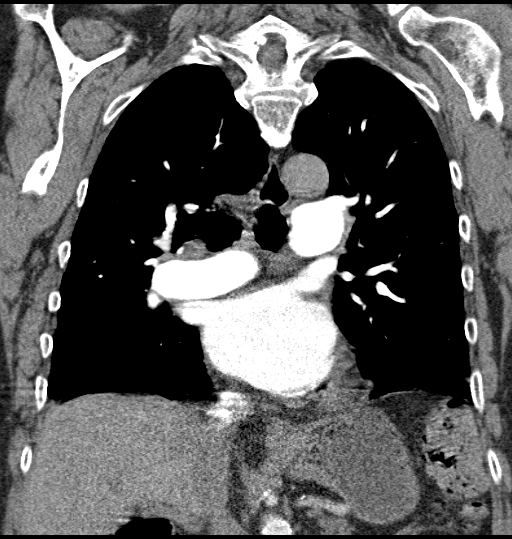
[im 98/131  soft-tissue]
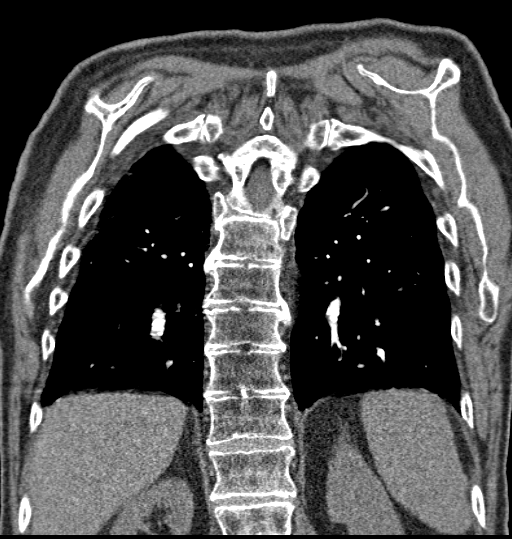

[18 of 46 positions shown; findings below may reference images not displayed]

FINDINGS: Lungs are hyperexpanded with mild emphysematous disease. There is
patchy bilateral peripheral increased interstitial markings likely
chronic. No focal lobar consolidation or effusion. Approximately 3
tiny 2 mm nodular densities over the left upper lobe/lingula.
Airways are within normal.

There is mild cardiomegaly. Mild calcified plaque over the left main
and 3 vessel coronary arteries. Mild calcified plaque over the
thoracic aorta. Mild ectasia of the ascending thoracic aorta
measuring 3.4 cm in AP diameter. No evidence of pulmonary embolism.
Prominence of the pulmonary arteries suggesting pulmonary arterial
hypertension. 1.4 cm precarinal lymph node. No hilar adenopathy or
axillary adenopathy. Remaining mediastinal structures are within
normal.

Images through the upper abdomen are unremarkable. Degenerative
change of the spine.

Review of the MIP images confirms the above findings.
IMPRESSION: No evidence of pulmonary embolism. No acute cardiopulmonary disease.

Emphysematous disease with mild patchy bilateral peripheral
interstitial disease.

Mild cardiomegaly and mild atherosclerotic coronary artery disease.

1.4 cm precarinal lymph node likely reactive.

Mild ectasia of the ascending thoracic aorta measuring 3.4 cm.
Recommend annual imaging followup by CTA or MRA. This recommendation
follows 2575 ACCF/AHA/AATS/ACR/ASA/SCA/HONGPAN/NAZARETH/MUNGER/MEKNI Guidelines
for the Diagnosis and Management of Patients with Thoracic Aortic
Disease. Circulation.2575; 121: [id number].

## 2017-10-10 ENCOUNTER — Ambulatory Visit (INDEPENDENT_AMBULATORY_CARE_PROVIDER_SITE_OTHER): Payer: Medicare Other

## 2017-10-10 DIAGNOSIS — I110 Hypertensive heart disease with heart failure: Secondary | ICD-10-CM

## 2017-10-10 DIAGNOSIS — G309 Alzheimer's disease, unspecified: Secondary | ICD-10-CM

## 2017-10-10 DIAGNOSIS — I5043 Acute on chronic combined systolic (congestive) and diastolic (congestive) heart failure: Secondary | ICD-10-CM | POA: Diagnosis not present

## 2017-10-10 DIAGNOSIS — E119 Type 2 diabetes mellitus without complications: Secondary | ICD-10-CM | POA: Diagnosis not present

## 2017-10-10 DIAGNOSIS — Z9181 History of falling: Secondary | ICD-10-CM

## 2017-10-10 DIAGNOSIS — I2510 Atherosclerotic heart disease of native coronary artery without angina pectoris: Secondary | ICD-10-CM | POA: Diagnosis not present

## 2017-10-10 DIAGNOSIS — F0280 Dementia in other diseases classified elsewhere without behavioral disturbance: Secondary | ICD-10-CM

## 2017-10-10 DIAGNOSIS — F329 Major depressive disorder, single episode, unspecified: Secondary | ICD-10-CM

## 2017-10-15 ENCOUNTER — Ambulatory Visit (INDEPENDENT_AMBULATORY_CARE_PROVIDER_SITE_OTHER): Payer: Medicare Other | Admitting: Family Medicine

## 2017-10-15 VITALS — BP 126/66 | HR 58 | Temp 96.6°F | Ht 75.0 in | Wt 176.2 lb

## 2017-10-15 DIAGNOSIS — H9192 Unspecified hearing loss, left ear: Secondary | ICD-10-CM | POA: Diagnosis not present

## 2017-10-15 NOTE — Progress Notes (Signed)
   HPI  Patient presents today here with decreased hearing.  Patient states that over the last 1 week he has had decreased hearing left ear with a "raining or swishing sound".  He denies any pain.  He states that his right ear has begun to echo.  He denies any new double vision, headache, weakness or numbness or tingling. Patient has had some double vision is been going on for 6-7 months he feels due to his glasses. The double vision is intermittent   PMH: Smoking status noted ROS: Per HPI  Objective: BP 126/66   Pulse (!) 58   Temp (!) 96.6 F (35.9 C) (Oral)   Ht 6\' 3"  (1.905 m)   Wt 176 lb 4 oz (79.9 kg)   BMI 22.03 kg/m  Gen: NAD, alert, cooperative with exam HEENT: NCAT, EOMI, PERRL, TMs normal bilaterally with minimal cerumen CV: RRR, good S1/S2, no murmur Resp: CTABL, no wheezes, non-labored Ext: No edema, warm Neuro: Alert and oriented, no nerves II through XII intact, strength 4/5 symmetric bilaterally in upper and lower extremities with normal sensation grossly  Assessment and plan:  #Decreased hearing left ear Decreased hearing with unusual tenderness in the left ear, right ear with an echo, the exam is reassuring with no obvious abnormalities Refer to ENT for their opinion. Neurologic exam is reassuring, unlikely to be a central problem    Orders Placed This Encounter  Procedures  . Ambulatory referral to ENT    Referral Priority:   Routine    Referral Type:   Consultation    Referral Reason:   Specialty Services Required    Requested Specialty:   Otolaryngology    Number of Visits Requested:   1     Murtis Sink, MD [facility] 10/15/2017, 10:06 AM

## 2017-10-21 ENCOUNTER — Ambulatory Visit (INDEPENDENT_AMBULATORY_CARE_PROVIDER_SITE_OTHER): Payer: Medicare Other | Admitting: Family

## 2017-10-21 ENCOUNTER — Encounter: Payer: Self-pay | Admitting: Family

## 2017-10-21 VITALS — BP 135/70 | HR 62 | Temp 97.7°F | Ht 75.0 in | Wt 171.0 lb

## 2017-10-21 DIAGNOSIS — G309 Alzheimer's disease, unspecified: Secondary | ICD-10-CM | POA: Diagnosis not present

## 2017-10-21 DIAGNOSIS — J439 Emphysema, unspecified: Secondary | ICD-10-CM | POA: Diagnosis not present

## 2017-10-21 DIAGNOSIS — I712 Thoracic aortic aneurysm, without rupture, unspecified: Secondary | ICD-10-CM

## 2017-10-21 DIAGNOSIS — F0280 Dementia in other diseases classified elsewhere without behavioral disturbance: Secondary | ICD-10-CM

## 2017-10-21 DIAGNOSIS — H9311 Tinnitus, right ear: Secondary | ICD-10-CM | POA: Diagnosis not present

## 2017-10-21 DIAGNOSIS — F320 Major depressive disorder, single episode, mild: Secondary | ICD-10-CM

## 2017-10-21 DIAGNOSIS — Z9181 History of falling: Secondary | ICD-10-CM

## 2017-10-21 DIAGNOSIS — H9193 Unspecified hearing loss, bilateral: Secondary | ICD-10-CM | POA: Diagnosis not present

## 2017-10-21 DIAGNOSIS — R0602 Shortness of breath: Secondary | ICD-10-CM

## 2017-10-21 MED ORDER — FLUTICASONE FUROATE-VILANTEROL 100-25 MCG/INH IN AEPB: 1.0000 | INHALATION_SPRAY | Freq: Every day | RESPIRATORY_TRACT | 3 refills | Status: AC
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
## 2017-10-21 NOTE — Patient Instructions (Signed)
Dementia Dementia is the loss of two or more brain functions, such as:  Memory.  Decision making.  Behavior.  Speaking.  Thinking.  Problem solving.  There are many types of dementia. The most common type is called progressive dementia. Progressive dementia gets worse with time and it is irreversible. An example of this type of dementia is Alzheimer disease. What are the causes? This condition may be caused by:  Nerve cell damage in the brain.  Genetic mutations.  Certain medicines.  Multiple small strokes.  An infection, such as chronic meningitis.  A metabolic problem, such as vitamin B12 deficiency or thyroid disease.  Pressure on the brain, such as from a tumor or blood clot.  What are the signs or symptoms? Symptoms of this condition include:  Sudden changes in mood.  Depression.  Problems with balance.  Changes in personality.  Poor short-term memory.  Agitation.  Delusions.  Hallucinations.  Having a hard time: ? Speaking thoughts. ? Finding words. ? Solving problems. ? Doing familiar tasks. ? Understanding familiar ideas.  How is this diagnosed? This condition is diagnosed with an assessment by your health care provider. During this assessment, your health care provider will talk with you and your family, friends, or caregivers about your symptoms. A thorough medical history will be taken, and you will have a physical exam and tests. Tests may include:  Lab tests, such as blood or urine tests.  Imaging tests, such as a CT scan, PET scan, or MRI.  A lumbar puncture. This test involves removing and testing a small amount of the fluid that surrounds the brain and spinal cord.  An electroencephalogram (EEG). In this test, small metal discs are used to measure electrical activity in the brain.  Memory tests, cognitive tests, and neuropsychological tests. These tests evaluate brain function.  How is this treated? Treatment depends on the  cause of the dementia. It may involve taking medicines that may help:  To control the dementia.  To slow down the disease.  To manage symptoms.  In some cases, treating the cause of the dementia can improve symptoms, reverse symptoms, or slow down how quickly the dementia gets worse. Your health care provider can help direct you to support groups, organizations, and other health care providers who can help with decisions about your care. Follow these instructions at home: Medicine  Take over-the-counter and prescription medicines only as told by your health care provider.  Avoid taking medicines that can affect thinking, such as pain or sleeping medicines. Lifestyle   Make healthy lifestyle choices: ? Be physically active as told by your health care provider. ? Do not use any tobacco products, such as cigarettes, chewing tobacco, and e-cigarettes. If you need help quitting, ask your health care provider. ? Eat a healthy diet. ? Practice stress-management techniques when you get stressed. ? Stay social.  Drink enough fluid to keep your urine clear or pale yellow.  Make sure to get quality sleep. These tips can help you to get a good night's rest: ? Avoid napping during the day. ? Keep your sleeping area dark and cool. ? Avoid exercising during the few hours before you go to bed. ? Avoid caffeine products in the evening. General instructions  Work with your health care provider to determine what you need help with and what your safety needs are.  If you were given a bracelet that tracks your location, make sure to wear it.  Keep all follow-up visits as told by your   health care provider. This is important. Contact a health care provider if:  You have any new symptoms.  You have problems with choking or swallowing.  You have any symptoms of a different illness. Get help right away if:  You develop a fever.  You have new or worsening confusion.  You have new or  worsening sleepiness.  You have a hard time staying awake.  You or your family members become concerned for your safety. This information is not intended to replace advice given to you by your health care provider. Make sure you discuss any questions you have with your health care provider. Document Released: 02/23/2001 Document Revised: 01/08/2016 Document Reviewed: 05/28/2015 Elsevier Interactive Patient Education  2018 [employer] Inc[employer]  

## 2017-10-27 ENCOUNTER — Ambulatory Visit: Payer: Medicare Other | Admitting: Family

## 2017-11-04 ENCOUNTER — Encounter: Payer: Self-pay | Admitting: Family Medicine

## 2017-11-04 ENCOUNTER — Ambulatory Visit (INDEPENDENT_AMBULATORY_CARE_PROVIDER_SITE_OTHER): Payer: Medicare Other | Admitting: Family Medicine

## 2017-11-04 ENCOUNTER — Ambulatory Visit: Payer: Medicare Other | Admitting: Physician Assistant

## 2017-11-04 VITALS — BP 139/73 | HR 70 | Temp 97.6°F | Ht 75.0 in | Wt 177.0 lb

## 2017-11-04 DIAGNOSIS — M25561 Pain in right knee: Secondary | ICD-10-CM

## 2017-11-04 DIAGNOSIS — M25552 Pain in left hip: Secondary | ICD-10-CM | POA: Diagnosis not present

## 2017-11-04 DIAGNOSIS — M25551 Pain in right hip: Secondary | ICD-10-CM | POA: Diagnosis not present

## 2017-11-04 DIAGNOSIS — G8929 Other chronic pain: Secondary | ICD-10-CM | POA: Diagnosis not present

## 2017-11-04 NOTE — Patient Instructions (Addendum)
You can take 1 tablet of Tylenol every 8 hours if needed for arthritis/hip pain.  Make sure that you are drinking plenty of water while taking this medicine.    Hip Pain The hip is the joint between the upper legs and the lower pelvis. The bones, cartilage, tendons, and muscles of your hip joint support your body and allow you to move around. Hip pain can range from a minor ache to severe pain in one or both of your hips. The pain may be felt on the inside of the hip joint near the groin, or the outside near the buttocks and upper thigh. You may also have swelling or stiffness. Follow these instructions at home: Managing pain, stiffness, and swelling  If directed, apply ice to the injured area. ? Put ice in a plastic bag. ? Place a towel between your skin and the bag. ? Leave the ice on for 20 minutes, 2-3 times a day  Sleep with a pillow between your legs on your most comfortable side.  Avoid any activities that cause pain. General instructions  Take over-the-counter and prescription medicines only as told by your health care provider.  Do any exercises as told by your health care provider.  Record the following: ? How often you have hip pain. ? The location of your pain. ? What the pain feels like. ? What makes the pain worse.  Keep all follow-up visits as told by your health care provider. This is important. Contact a health care provider if:  You cannot put weight on your leg.  Your pain or swelling continues or gets worse after one week.  It gets harder to walk.  You have a fever. Get help right away if:  You fall.  You have a sudden increase in pain and swelling in your hip.  Your hip is red or swollen or very tender to touch. Summary  Hip pain can range from a minor ache to severe pain in one or both of your hips.  The pain may be felt on the inside of the hip joint near the groin, or the outside near the buttocks and upper thigh.  Avoid any activities that  cause pain.  Record how often you have hip pain, the location of the pain, what makes it worse and what it feels like. This information is not intended to replace advice given to you by your health care provider. Make sure you discuss any questions you have with your health care provider. Document Released: 02/17/2010 Document Revised: 08/02/2016 Document Reviewed: 08/02/2016 Elsevier Interactive Patient Education  [employer].

## 2017-11-04 NOTE — Progress Notes (Signed)
Subjective: [name] hip pain PCP: Ryan Spencer, FNP PET:Ryan [name] is a 83 y.o. male presenting to clinic today for:  1. Bilateral hip pain Patient is unable to remember when the pain started.  He notes he has chronic memory problems.  He describes the pain as aching and bilateral.  He points to the lateral aspects of bilateral hips.  Denies radiation to the groin.  Denies any numbness or tingling.  He has a history of chronic falls and weakness.  He has not been taking any medications or using any topical analgesics for symptoms.  He also notes right knee pain.  He has a knee brace.  He notes that he lives alone.  Past surgical history significant for left hip fracture with surgical repair.   ROS: Per HPI  Allergies  Allergen Reactions  . Lisinopril Cough   Past Medical History:  Diagnosis Date  . Abnormal ECG   . Cardiomyopathy (HCC)    LVEF 45-50%  . Coronary atherosclerosis of native coronary artery    Based on Myoview - managing medically  . Dementia   . Essential hypertension, benign   . Type 2 diabetes mellitus (HCC)   . Vitamin B12 deficiency 05/01/2016    Current Outpatient Medications:  .  Ascorbic Acid (VITAMIN C) 100 MG tablet, Take 500 mg by mouth 2 (two) times daily. , Disp: , Rfl:  .  aspirin EC 81 MG tablet, Take 81 mg by mouth every other day. , Disp: , Rfl:  .  atorvastatin (LIPITOR) 40 MG tablet, TAKE ONE TABLET BY MOUTH ONCE DAILY, Disp: 90 tablet, Rfl: 1 .  escitalopram (LEXAPRO) 5 MG tablet, Take 1 tablet (5 mg total) daily by mouth., Disp: 90 tablet, Rfl: 1 .  fluticasone furoate-vilanterol (BREO ELLIPTA) 100-25 MCG/INH AEPB, Inhale 1 puff into the lungs daily., Disp: 60 each, Rfl: 3 .  furosemide (LASIX) 20 MG tablet, TAKE ONE TABLET BY MOUTH ONCE DAILY AT NOON, Disp: 90 tablet, Rfl: 1 .  Garlic 1000 MG CAPS, Take 3 capsules by mouth every morning. , Disp: , Rfl:  .  Ginger 500 MG CAPS, Take 1 capsule by mouth daily. , Disp: , Rfl:  .   hydrALAZINE (APRESOLINE) 25 MG tablet, Take 1 tablet (25 mg total) by mouth 2 (two) times daily., Disp: 180 tablet, Rfl: 1 .  ibuprofen (ADVIL,MOTRIN) 200 MG tablet, Take 400 mg by mouth every 6 (six) hours as needed for moderate pain., Disp: , Rfl:  .  isosorbide mononitrate (IMDUR) 30 MG 24 hr tablet, TAKE ONE-HALF TABLET BY MOUTH ONCE DAILY, Disp: 45 tablet, Rfl: 0 .  Memantine HCl-Donepezil HCl (NAMZARIC) 28-10 MG CP24, Take 1 tablet by mouth daily., Disp: 90 capsule, Rfl: 1 .  Multiple Vitamin (VITAMIN E/FOLIC ACID/B-6/B-12 PO), Take 10 mg daily by mouth., Disp: , Rfl:  .  potassium chloride (K-DUR) 10 MEQ tablet, Take 1 tablet (10 mEq total) by mouth daily., Disp: 90 tablet, Rfl: 3 .  PROAIR HFA 108 (90 Base) MCG/ACT inhaler, INHALE TWO PUFFS BY MOUTH EVERY 6 HOURS AS NEEDED FOR WHEEZING AND FOR SHORTNESS OF BREATH, Disp: 9 each, Rfl: 2 .  vitamin B-12 (CYANOCOBALAMIN) 1000 MCG tablet, Take 0.5 tablets (500 mcg total) by mouth daily. (Patient taking differently: Take 1,000 mcg by mouth 3 (three) times daily. ), Disp: , Rfl:  Social History   Socioeconomic History  . Marital status: Divorced    Spouse name: Not on file  . Number of children: Not on  file  . Years of education: Not on file  . Highest education level: Not on file  Social Needs  . Financial resource strain: Not on file  . Food insecurity - worry: Not on file  . Food insecurity - inability: Not on file  . Transportation needs - medical: Not on file  . Transportation needs - non-medical: Not on file  Occupational History  . Not on file  Tobacco Use  . Smoking status: Former Smoker    Packs/day: 1.00    Years: 15.00    Pack years: 15.00    Types: Cigarettes    Start date: 01/12/1951    Last attempt to quit: 01/12/1967    Years since quitting: 50.8  . Smokeless tobacco: Never Used  Substance and Sexual Activity  . Alcohol use: No    Alcohol/week: 0.0 oz  . Drug use: No  . Sexual activity: No  Other Topics Concern  .  Not on file  Social History Narrative  . Not on file   Family History  Problem Relation Age of Onset  . Heart attack Mother   . Emphysema Mother   . COPD Father   . Alzheimer's disease Sister   . Heart disease Brother   . Diabetes Paternal Grandfather   . COPD Sister   . Cancer Sister        breast  . AAA (abdominal aortic aneurysm) Brother   . Heart attack Brother     Objective: Office vital signs reviewed. BP 139/73   Pulse 70   Temp 97.6 F (36.4 C) (Oral)   Ht  (1.905 m)   Wt 177 lb (80.3 kg)   BMI 22.12 kg/m   Physical Examination:  General: Awake, alert, thin, elderly male, No acute distress Extremities: warm, well perfused, No edema, cyanosis or clubbing; +1 DP pulses bilaterally MSK: shuffled gait and normal station  Left hip: Patient is able to flex and extend hip independently.  He has pain with internal rotation of the left hip.  External rotation limited.  No tenderness to palpation of the greater trochanter.  Right hip: Patient able to flex and extend hip independently.  He has pain with abduction of the right lower extremity.  No pain with internal or external rotation.  Right knee: Chronic osteoarthritic changes appreciated within the knee.  There is no joint effusion or swelling.  No ligamentous laxity.  Palpable crepitus noted. Skin: dry; intact; no rashes or lesions Neuro: 4/5 LE Strength and light touch sensation grossly intact  Assessment/ Plan: 83 y.o. male   1. Bilateral hip pain I suspect arthritic changes.  No preceding injury to suggest fracture.  Patient is a poor historian so difficult to tell how chronic pain actually is.  He has a history of frequent falls and dementia.  He was certainly would benefit from 24/7 care.  I recommended trial of Tylenol every 8 hours.  He may use topical analgesic of his choice.  Ice.  This was explained verbally and reiterated in his AVS today.  He should follow-up with PCP as needed ongoing needs.  Could  consider x-rays in the future if persistent / worsening.  2. Chronic pain of right knee Patient had a knee brace that he brought into office.  Assistance was provided to apply this prior to him leaving today.  Tylenol as above.  Raliegh Ip, DO [facility] [phone]

## 2017-12-20 ENCOUNTER — Ambulatory Visit (INDEPENDENT_AMBULATORY_CARE_PROVIDER_SITE_OTHER): Payer: Medicare Other | Admitting: Neurology

## 2017-12-20 ENCOUNTER — Encounter: Payer: Self-pay | Admitting: Neurology

## 2017-12-20 VITALS — Ht 75.0 in | Wt 176.0 lb

## 2017-12-20 DIAGNOSIS — F0280 Dementia in other diseases classified elsewhere without behavioral disturbance: Secondary | ICD-10-CM | POA: Diagnosis not present

## 2017-12-20 DIAGNOSIS — G309 Alzheimer's disease, unspecified: Secondary | ICD-10-CM | POA: Diagnosis not present

## 2017-12-20 DIAGNOSIS — R269 Unspecified abnormalities of gait and mobility: Secondary | ICD-10-CM | POA: Diagnosis not present

## 2017-12-20 NOTE — Progress Notes (Signed)
PATIENT: Ryan [name] DOB: [birth year]  Chief Complaint  Patient presents with  . Dementia    MMSE 22/30 - 7 animals.  He lives alone and has no family in the area.  He only uses his microwave to cook at home.  States his children have passed away.  He is divorced and did not end on good terms with his ex-wife..  He has a few scattered nephews but he does not speak with them.  He was given Namzaric last year but does not remeber why he stopped it.  Says his short-term memory is "gone".  He can remember better from years ago.  Marland Kitchen PCP    [name], FNP     HISTORICAL  Ryan [name] is a 83 years old male, seen in refer by his primary care Nurse Practitioner [name], FNP for evaluation of memory loss. Initial evaluation was on December 20 2017.  Patient is a poor historian, he drove him self to clinic, by reviewing the medication list, he has past medical history of hyperlipidemia, hypertension, diabetes, coronary artery disease, he lived alone for over 20 years, his 2 children and extended family has passed away, he is distant with his ex-wife,  He retired from [employer], later drives long distance bus until 2010, he noticed double vision, has to quit driving, he now lives alone, does his own grocery shopping, only eating microwave food,  He noted gradual onset memory trouble, he tends to be startled easily during today's interview, started to crying when he talked about his past experience, he was also noted to have significant gait abnormality.  I personally reviewed MRI of the brain in August 2017, generalized atrophy, mild supratentorium small vessel disease, severe stenosis of proximal posterior cerebral arteries, 75% possibly greater degree of stenosis of right supraclinoid internal carotid artery.  Lab evaluation showed normal CMP, Hg 12.4, TSH,    REVIEW OF SYSTEMS: Full 14 system review of systems performed and notable only for chills, fatigue, chest pain,  swelling legs, hearing loss, ringing the ears, trouble swallowing, birth marks, itching, double vision, eye pain, shortness of breath, wheezing, incontinence, constipation, urination problem, incontinence, impotence, easy bruising, easy bleeding, feeling cold, thirst, flushing, joint pain, cramps, runny nose, memory loss, difficulty swallowing, dizziness, decreased energy, racing thoughts  ALLERGIES: Allergies  Allergen Reactions  . Lisinopril Cough    HOME MEDICATIONS: Current Outpatient Medications  Medication Sig Dispense Refill  . Ascorbic Acid (VITAMIN C) 100 MG tablet Take 500 mg by mouth 2 (two) times daily.     Marland Kitchen aspirin EC 81 MG tablet Take 81 mg by mouth every other day.     Marland Kitchen atorvastatin (LIPITOR) 40 MG tablet TAKE ONE TABLET BY MOUTH ONCE DAILY 90 tablet 1  . escitalopram (LEXAPRO) 5 MG tablet Take 1 tablet (5 mg total) daily by mouth. 90 tablet 1  . fluticasone furoate-vilanterol (BREO ELLIPTA) 100-25 MCG/INH AEPB Inhale 1 puff into the lungs daily. 60 each 3  . furosemide (LASIX) 20 MG tablet TAKE ONE TABLET BY MOUTH ONCE DAILY AT NOON 90 tablet 1  . Garlic 1000 MG CAPS Take 3 capsules by mouth every morning.     . Ginger 500 MG CAPS Take 1 capsule by mouth daily.     . hydrALAZINE (APRESOLINE) 25 MG tablet Take 1 tablet (25 mg total) by mouth 2 (two) times daily. 180 tablet 1  . ibuprofen (ADVIL,MOTRIN) 200 MG tablet Take 400 mg by mouth every 6 (  six) hours as needed for moderate pain.    . isosorbide mononitrate (IMDUR) 30 MG 24 hr tablet TAKE ONE-HALF TABLET BY MOUTH ONCE DAILY 45 tablet 0  . Multiple Vitamin (VITAMIN E/FOLIC ACID/B-6/B-12 PO) Take 10 mg daily by mouth.    . potassium chloride (K-DUR) 10 MEQ tablet Take 1 tablet (10 mEq total) by mouth daily. 90 tablet 3  . PROAIR HFA 108 (90 Base) MCG/ACT inhaler INHALE TWO PUFFS BY MOUTH EVERY 6 HOURS AS NEEDED FOR WHEEZING AND FOR SHORTNESS OF BREATH 9 each 2  . vitamin B-12 (CYANOCOBALAMIN) 1000 MCG tablet Take 0.5  tablets (500 mcg total) by mouth daily. (Patient taking differently: Take 1,000 mcg by mouth 3 (three) times daily. )     No current facility-administered medications for this visit.     PAST MEDICAL HISTORY: Past Medical History:  Diagnosis Date  . Abnormal ECG   . Cardiomyopathy (HCC)    LVEF 45-50%  . Coronary atherosclerosis of native coronary artery    Based on Myoview - managing medically  . Dementia   . Essential hypertension, benign   . Type 2 diabetes mellitus (HCC)   . Vitamin B12 deficiency 05/01/2016    PAST SURGICAL HISTORY: Past Surgical History:  Procedure Laterality Date  . EYE SURGERY Bilateral 08/2016   Dr. Sharl Ma, [place]  . HERNIA REPAIR    . HIP SURGERY    . KNEE SURGERY    . PROSTATE SURGERY      FAMILY HISTORY: Family History  Problem Relation Age of Onset  . Heart attack Mother   . Emphysema Mother   . COPD Father   . Alzheimer's disease Sister   . Heart disease Brother   . Diabetes Paternal Grandfather   . COPD Sister   . Cancer Sister        breast  . AAA (abdominal aortic aneurysm) Brother   . Heart attack Brother     SOCIAL HISTORY:  Social History   Socioeconomic History  . Marital status: Divorced    Spouse name: Not on file  . Number of children: Not on file  . Years of education: Not on file  . Highest education level: Not on file  Occupational History  . Not on file  Social Needs  . Financial resource strain: Not on file  . Food insecurity:    Worry: Not on file    Inability: Not on file  . Transportation needs:    Medical: Not on file    Non-medical: Not on file  Tobacco Use  . Smoking status: Former Smoker    Packs/day: 1.00    Years: 15.00    Pack years: 15.00    Types: Cigarettes    Start date: 01/12/1951    Last attempt to quit: 01/12/1967    Years since quitting: 50.9  . Smokeless tobacco: Never Used  Substance and Sexual Activity  . Alcohol use: No    Alcohol/week: 0.0 oz  . Drug use: No  . Sexual  activity: Never  Lifestyle  . Physical activity:    Days per week: Not on file    Minutes per session: Not on file  . Stress: Not on file  Relationships  . Social connections:    Talks on phone: Not on file    Gets together: Not on file    Attends religious service: Not on file    Active member of club or organization: Not on file    Attends meetings of clubs or  organizations: Not on file    Relationship status: Not on file  . Intimate partner violence:    Fear of current or ex partner: Not on file    Emotionally abused: Not on file    Physically abused: Not on file    Forced sexual activity: Not on file  Other Topics Concern  . Not on file  Social History Narrative   Lives alone.   Right-handed.   3 cups coffee and 3 glasses of tea per day.     PHYSICAL EXAM   Vitals:   12/20/17 1121  Weight: 176 lb (79.8 kg)  Height: 6\' 3"  (1.905 m)    Not recorded      Body mass index is 22 kg/m.  PHYSICAL EXAMNIATION:  Gen: NAD, conversant, well nourised, obese, well groomed                     Cardiovascular: Regular rate rhythm, no peripheral edema, warm, nontender. Eyes: Conjunctivae clear without exudates or hemorrhage Neck: Supple, no carotid bruits. Pulmonary: Clear to auscultation bilaterally   NEUROLOGICAL EXAM:  MMSE - Mini Mental State Exam 12/20/2017 12/21/2016 05/12/2015  Orientation to time 5 5 4   Orientation to Place 4 5 5   Registration 3 3 3   Attention/ Calculation 2 4 5   Recall 0 1 1  Language- name 2 objects 2 2 2   Language- repeat 1 1 1   Language- follow 3 step command 3 3 3   Language- read & follow direction 1 1 1   Write a sentence 1 1 1   Copy design 0 1 1  Total score 22 27 27   animal naming 7.   CRANIAL NERVES: CN II: Visual fields are full to confrontation. Pupils are round equal and briskly reactive to light. CN III, IV, VI: extraocular movement are normal. No ptosis. CN V: Facial sensation is intact to pinprick in all 3 divisions  bilaterally. Corneal responses are intact.  CN VII: Face is symmetric with normal eye closure and smile. CN VIII: Hearing is normal to rubbing fingers CN IX, X: Palate elevates symmetrically. Phonation is normal. CN XI: Head turning and shoulder shrug are intact CN XII: Tongue is midline with normal movements and no atrophy.  MOTOR: There is no pronator drift of out-stretched arms. Muscle bulk and tone are normal. Muscle strength is normal.  REFLEXES: Reflexes are 2+ and symmetric at the biceps, triceps, knees, and ankles. Plantar responses are flexor.  SENSORY: Intact to light touch, pinprick, positional sensation and vibratory sensation are intact in fingers and toes.  COORDINATION: Rapid alternating movements and fine finger movements are intact. There is no dysmetria on finger-to-nose and heel-knee-shin.    GAIT/STANCE: He needs push up to get up from seated position, wide based, unsteady, relies on his cane.   DIAGNOSTIC DATA (LABS, IMAGING, TESTING) - I reviewed patient records, labs, notes, testing and imaging myself where available.   ASSESSMENT AND PLAN  Ryan [name] is a 83 y.o. 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 Ph.D.  [facility] [address], [address] [place], [place] [address] Ph: [phone] Fax: [phone]  CC: [name], FNP

## 2018-01-05 ENCOUNTER — Emergency Department (HOSPITAL_COMMUNITY)
Admission: EM | Admit: 2018-01-05 | Discharge: 2018-01-05 | Disposition: A | Discharge status: Home / Self Care | Payer: Medicare Other | Attending: Emergency Medicine | Admitting: Emergency Medicine

## 2018-01-05 ENCOUNTER — Telehealth: Payer: Self-pay | Admitting: *Deleted

## 2018-01-05 ENCOUNTER — Other Ambulatory Visit: Payer: Self-pay

## 2018-01-05 ENCOUNTER — Encounter (HOSPITAL_COMMUNITY): Payer: Self-pay

## 2018-01-05 ENCOUNTER — Emergency Department (HOSPITAL_COMMUNITY): Payer: Medicare Other

## 2018-01-05 DIAGNOSIS — R0602 Shortness of breath: Secondary | ICD-10-CM | POA: Diagnosis present

## 2018-01-05 DIAGNOSIS — Z79899 Other long term (current) drug therapy: Secondary | ICD-10-CM | POA: Diagnosis not present

## 2018-01-05 DIAGNOSIS — Z7982 Long term (current) use of aspirin: Secondary | ICD-10-CM | POA: Insufficient documentation

## 2018-01-05 DIAGNOSIS — R0682 Tachypnea, not elsewhere classified: Secondary | ICD-10-CM | POA: Diagnosis not present

## 2018-01-05 DIAGNOSIS — R0789 Other chest pain: Secondary | ICD-10-CM | POA: Diagnosis not present

## 2018-01-05 DIAGNOSIS — F0280 Dementia in other diseases classified elsewhere without behavioral disturbance: Secondary | ICD-10-CM | POA: Insufficient documentation

## 2018-01-05 DIAGNOSIS — J449 Chronic obstructive pulmonary disease, unspecified: Secondary | ICD-10-CM | POA: Diagnosis not present

## 2018-01-05 DIAGNOSIS — E119 Type 2 diabetes mellitus without complications: Secondary | ICD-10-CM | POA: Insufficient documentation

## 2018-01-05 DIAGNOSIS — I10 Essential (primary) hypertension: Secondary | ICD-10-CM | POA: Insufficient documentation

## 2018-01-05 DIAGNOSIS — R079 Chest pain, unspecified: Secondary | ICD-10-CM | POA: Diagnosis not present

## 2018-01-05 DIAGNOSIS — Z87891 Personal history of nicotine dependence: Secondary | ICD-10-CM | POA: Insufficient documentation

## 2018-01-05 DIAGNOSIS — J069 Acute upper respiratory infection, unspecified: Secondary | ICD-10-CM | POA: Diagnosis not present

## 2018-01-05 DIAGNOSIS — R509 Fever, unspecified: Secondary | ICD-10-CM | POA: Diagnosis not present

## 2018-01-05 DIAGNOSIS — G309 Alzheimer's disease, unspecified: Secondary | ICD-10-CM | POA: Diagnosis not present

## 2018-01-05 LAB — BASIC METABOLIC PANEL
ANION GAP: 12 (ref 5–15)
BUN: 21 mg/dL — ABNORMAL HIGH (ref 6–20)
CALCIUM: 9.1 mg/dL (ref 8.9–10.3)
CO2: 25 mmol/L (ref 22–32)
Chloride: 104 mmol/L (ref 101–111)
Creatinine, Ser: 1.09 mg/dL (ref 0.61–1.24)
GLUCOSE: 143 mg/dL — AB (ref 65–99)
POTASSIUM: 3.9 mmol/L (ref 3.5–5.1)
SODIUM: 141 mmol/L (ref 135–145)

## 2018-01-05 LAB — CBC WITH DIFFERENTIAL/PLATELET
BASOS ABS: 0 10*3/uL (ref 0.0–0.1)
BASOS PCT: 0 %
EOS ABS: 0.1 10*3/uL (ref 0.0–0.7)
EOS PCT: 1 %
HCT: 39.3 % (ref 39.0–52.0)
Hemoglobin: 12.8 g/dL — ABNORMAL LOW (ref 13.0–17.0)
LYMPHS PCT: 34 %
Lymphs Abs: 2.4 10*3/uL (ref 0.7–4.0)
MCH: 31 pg (ref 26.0–34.0)
MCHC: 32.6 g/dL (ref 30.0–36.0)
MCV: 95.2 fL (ref 78.0–100.0)
MONO ABS: 0.5 10*3/uL (ref 0.1–1.0)
Monocytes Relative: 7 %
Neutro Abs: 4 10*3/uL (ref 1.7–7.7)
Neutrophils Relative %: 58 %
PLATELETS: 146 10*3/uL — AB (ref 150–400)
RBC: 4.13 MIL/uL — AB (ref 4.22–5.81)
RDW: 13.7 % (ref 11.5–15.5)
WBC: 7 10*3/uL (ref 4.0–10.5)

## 2018-01-05 LAB — TROPONIN I: Troponin I: <0.03 ng/mL (ref <0.03)

## 2018-01-05 MED ORDER — AZITHROMYCIN 250 MG PO TABS: 250.0000 mg | ORAL_TABLET | Freq: Every day | ORAL | 0 refills | Status: DC

## 2018-01-05 MED ORDER — PREDNISONE 10 MG PO TABS: 10.0000 mg | ORAL_TABLET | Freq: Every day | ORAL | 0 refills | Status: DC

## 2018-01-05 MED ORDER — ALBUTEROL SULFATE HFA 108 (90 BASE) MCG/ACT IN AERS: 1.0000 | INHALATION_SPRAY | Freq: Four times a day (QID) | RESPIRATORY_TRACT | 0 refills | Status: AC | PRN

## 2018-01-05 NOTE — ED Provider Notes (Signed)
[facility] EMERGENCY DEPARTMENT Provider Note   CSN: [id number] Arrival date & time: 01/05/18  1335     History   Chief Complaint Chief Complaint  Patient presents with  . Shortness of Breath    HPI Ryan [name] is a 83 y.o. male.  Level 5 caveat for dementia.  Patient presents with fever and cough earlier this week with associated mild dyspnea.  Temperature was allegedly 101 on Monday.  Past medical history includes COPD, CAD, diabetes, many others.  He lives independently.  No substernal chest pain, diaphoresis, nausea. Severity of sxs were mild.  Nothing makes symptoms better or worse     Past Medical History:  Diagnosis Date  . Abnormal ECG   . Cardiomyopathy (HCC)    LVEF 45-50%  . Coronary atherosclerosis of native coronary artery    Based on Myoview - managing medically  . Dementia   . Essential hypertension, benign   . Type 2 diabetes mellitus (HCC)   . Vitamin B12 deficiency 05/01/2016    Patient Active Problem List   Diagnosis Date Noted  . Alzheimer's dementia without behavioral disturbance 12/20/2017  . Gait abnormality 12/20/2017  . At high risk for falls 09/19/2017  . Depression, major, single episode, mild (HCC) 09/19/2017  . Vitamin B12 deficiency 05/01/2016  . Near syncope 04/30/2016  . Cardiomyopathy (HCC) 04/30/2016  . Normocytic anemia 04/30/2016  . Cerebrovascular disease 04/30/2016  . Hyperlipemia 04/22/2016  . COPD (chronic obstructive pulmonary disease) (HCC) 02/24/2016  . Aneurysm of thoracic aorta (HCC) 02/24/2016  . Essential hypertension, benign 01/16/2016  . Exertional dyspnea 05/01/2015  . Acute diastolic heart failure, NYHA class 1 (HCC) 05/01/2015  . Thrombocytopenia (HCC) 05/01/2015  . Coronary atherosclerosis of native coronary artery 10/16/2012  . Dementia 10/16/2012  . EKG, abnormal 07/07/2012    Past Surgical History:  Procedure Laterality Date  . EYE SURGERY Bilateral 08/2016   Dr. Sharl Ma, [place]  . HERNIA  REPAIR    . HIP SURGERY    . KNEE SURGERY    . PROSTATE SURGERY          Home Medications    Prior to Admission medications   Medication Sig Start Date End Date Taking? Authorizing Provider  Ascorbic Acid (VITAMIN C) 100 MG tablet Take 500 mg by mouth 2 (two) times daily.    Yes [provider]  aspirin EC 81 MG tablet Take 81 mg by mouth every other day.  07/08/12  Yes Osvaldo Shipper, MD  atorvastatin (LIPITOR) 40 MG tablet TAKE ONE TABLET BY MOUTH ONCE DAILY 08/25/16  Yes Jannifer Rodney A, FNP  escitalopram (LEXAPRO) 5 MG tablet Take 1 tablet (5 mg total) daily by mouth. 08/01/17  Yes Hawks, Christy A, FNP  fluticasone furoate-vilanterol (BREO ELLIPTA) 100-25 MCG/INH AEPB Inhale 1 puff into the lungs daily. 10/21/17  Yes Hawks, Neysa Bonito A, FNP  furosemide (LASIX) 20 MG tablet TAKE ONE TABLET BY MOUTH ONCE DAILY AT NOON 08/25/17  Yes Jonelle Sidle, MD  Garlic 1000 MG CAPS Take 3 capsules by mouth every morning.    Yes [provider]  Ginger 500 MG CAPS Take 1 capsule by mouth daily.    Yes [provider]  hydrALAZINE (APRESOLINE) 25 MG tablet Take 1 tablet (25 mg total) by mouth 2 (two) times daily. 08/25/17  Yes Jonelle Sidle, MD  isosorbide mononitrate (IMDUR) 30 MG 24 hr tablet TAKE ONE-HALF TABLET BY MOUTH ONCE DAILY 06/30/17  Yes [name], FNP  Multiple Vitamin (  VITAMIN E/FOLIC ACID/B-6/B-12 PO) Take 10 mg daily by mouth.   Yes [provider]  potassium chloride (K-DUR) 10 MEQ tablet Take 1 tablet (10 mEq total) by mouth daily. 09/05/17  Yes Jonelle Sidle, MD  vitamin B-12 (CYANOCOBALAMIN) 1000 MCG tablet Take 0.5 tablets (500 mcg total) by mouth daily. Patient taking differently: Take 1,000 mcg by mouth 3 (three) times daily.  05/01/16  Yes Elliot Cousin, MD  albuterol (PROVENTIL HFA;VENTOLIN HFA) 108 (90 Base) MCG/ACT inhaler Inhale 1-2 puffs into the lungs every 6 (six) hours as needed for wheezing or shortness of breath.  01/05/18   Donnetta Hutching, MD  azithromycin (ZITHROMAX) 250 MG tablet Take 1 tablet (250 mg total) by mouth daily. Take first 2 tablets together, then 1 every day until finished. 01/05/18   Donnetta Hutching, MD  ibuprofen (ADVIL,MOTRIN) 200 MG tablet Take 400 mg by mouth every 6 (six) hours as needed for moderate pain.    [provider]  predniSONE (DELTASONE) 10 MG tablet Take 1 tablet (10 mg total) by mouth daily with breakfast. 2 tablets for 5 days, 1 tablet for 5 days 01/05/18   Donnetta Hutching, MD    Family History Family History  Problem Relation Age of Onset  . Heart attack Mother   . Emphysema Mother   . COPD Father   . Alzheimer's disease Sister   . Heart disease Brother   . Diabetes Paternal Grandfather   . COPD Sister   . Cancer Sister        breast  . AAA (abdominal aortic aneurysm) Brother   . Heart attack Brother     Social History Social History   Tobacco Use  . Smoking status: Former Smoker    Packs/day: 1.00    Years: 15.00    Pack years: 15.00    Types: Cigarettes    Start date: 01/12/1951    Last attempt to quit: 01/12/1967    Years since quitting: 51.0  . Smokeless tobacco: Never Used  Substance Use Topics  . Alcohol use: No    Alcohol/week: 0.0 oz  . Drug use: No     Allergies   Lisinopril   Review of Systems Review of Systems  All other systems reviewed and are negative.    Physical Exam Updated Vital Signs BP (!) 151/76 (BP Location: Right Arm)   Pulse 60   Temp 97.9 F (36.6 C) (Oral)   Resp (!) 24   Ht 6\' 3"  (1.905 m)   Wt 79.8 kg (176 lb)   SpO2 98%   BMI 22.00 kg/m   Physical Exam  Constitutional: He is oriented to person, place, and time.  nad  HENT:  Head: Normocephalic and atraumatic.  Eyes: Conjunctivae are normal.  Neck: Neck supple.  Cardiovascular: Normal rate and regular rhythm.  Pulmonary/Chest: Effort normal and breath sounds normal.  Abdominal: Soft. Bowel sounds are normal.  Musculoskeletal: Normal range of  motion.  Neurological: He is alert and oriented to person, place, and time.  Skin: Skin is warm and dry.  Psychiatric: He has a normal mood and affect. His behavior is normal.  Nursing note and vitals reviewed.    ED Treatments / Results  Labs (all labs ordered are listed, but only abnormal results are displayed) Labs Reviewed  CBC WITH DIFFERENTIAL/PLATELET - Abnormal; Notable for the following components:      Result Value   RBC 4.13 (*)    Hemoglobin 12.8 (*)    Platelets 146 (*)  All other components within normal limits  BASIC METABOLIC PANEL - Abnormal; Notable for the following components:   Glucose, Bld 143 (*)    BUN 21 (*)    All other components within normal limits  TROPONIN I    EKG EKG Interpretation  Date/Time:  Thursday January 05 2018 13:51:53 EDT Ventricular Rate:  62 PR Interval:    QRS Duration: 96 QT Interval:  429 QTC Calculation: 436 R Axis:   65 Text Interpretation:  Sinus rhythm Abnormal R-wave progression, early transition Inferior infarct, age indeterminate Lateral leads are also involved Confirmed by Donnetta Hutching ([id number]) on 01/05/2018 2:27:11 PM   Radiology Dg Chest Portable 1 View  Result Date: 01/05/2018 CLINICAL DATA:  Sob x 2-3 days. Fever of 101 on Monday. Chest pain with deep inspirations. Hx of copd. EXAM: PORTABLE CHEST 1 VIEW COMPARISON:  06/29/2016 FINDINGS: The heart is mildly enlarged. There are no focal consolidations or pleural effusions. Mildly prominent interstitial markings are likely chronic. There is atherosclerotic calcification of the thoracic aorta. IMPRESSION: 1. Stable mild cardiomegaly. 2. Aortic atherosclerosis.  (ICD10-I70.0) Electronically Signed   By: Norva Pavlov M.D.   On: 01/05/2018 14:15    Procedures Procedures (including critical care time)  Medications Ordered in ED Medications - No data to display   Initial Impression / Assessment and Plan / ED Course  I have reviewed the triage vital signs and the  nursing notes.  Pertinent labs & imaging results that were available during my care of the patient were reviewed by me and considered in my medical decision making (see chart for details).     Patient presents with cough, fever, mild dyspnea.  History and physical most consistent with URI.  Work-up in the emergency department including labs, troponin, EKG, chest x-ray showed no acute findings.  Because of patient's debilitated condition.  I will start antibiotics.  Discharge medications Zithromax, prednisone, albuterol inhaler.  Final Clinical Impressions(s) / ED Diagnoses   Final diagnoses:  Upper respiratory tract infection, unspecified type    ED Discharge Orders        Ordered    azithromycin (ZITHROMAX) 250 MG tablet  Daily     01/05/18 1850    albuterol (PROVENTIL HFA;VENTOLIN HFA) 108 (90 Base) MCG/ACT inhaler  Every 6 hours PRN     01/05/18 1850    predniSONE (DELTASONE) 10 MG tablet  Daily with breakfast     01/05/18 1850       Donnetta Hutching, MD 01/05/18 1900

## 2018-01-05 NOTE — Telephone Encounter (Signed)
Incoming call from pt Complaining of SOB Pt gasping and grunting with broken sentences Pt instructed to call 911 or go to the nearest ED

## 2018-01-05 NOTE — Discharge Instructions (Addendum)
Tests showed no life-threatening condition.  Prescription for antibiotic, prednisone, inhaler.  Follow-up with your primary care doctor or return if worse.

## 2018-01-05 NOTE — ED Notes (Signed)
Dr Cook at bedside

## 2018-01-05 NOTE — ED Triage Notes (Addendum)
Ems reports pt has had SOB x 2 or 3 days.  Pt lives at home alone.  EMS says they will refer pt to [facility].  Reports fever 101 Monday, none since then.  Pt 98% on 3liters but they put pt on NRB because he was complaining he couldn't feel the oxygen.  Pt ambulatory.  Pt has COPD.  Reports chest pain with inspiration.

## 2018-01-09 ENCOUNTER — Encounter: Payer: Self-pay | Admitting: Family

## 2018-01-09 ENCOUNTER — Ambulatory Visit (INDEPENDENT_AMBULATORY_CARE_PROVIDER_SITE_OTHER): Payer: Medicare Other | Admitting: Family

## 2018-01-09 VITALS — BP 137/59 | HR 55 | Temp 97.0°F | Ht 75.0 in | Wt 176.0 lb

## 2018-01-09 DIAGNOSIS — Z09 Encounter for follow-up examination after completed treatment for conditions other than malignant neoplasm: Secondary | ICD-10-CM

## 2018-01-09 DIAGNOSIS — J069 Acute upper respiratory infection, unspecified: Secondary | ICD-10-CM

## 2018-01-09 MED ORDER — FLUTICASONE PROPIONATE 50 MCG/ACT NA SUSP: 2.0000 | Freq: Every day | NASAL | 6 refills | Status: DC

## 2018-01-09 NOTE — Progress Notes (Signed)
   Subjective:    Patient ID: Ryan [name], male    DOB: [birth year], 82 y.o.   MRN: [id number]  HPI PT presents to the office today for ED follow up. PT was seen on 01/05/18 and diagnosed with URI and given Zpak and prednisone. He reports he has two days left of zpak and a few pills left of prednisone.      Pt is afebrile today. Pt states his cough is better and depending on what he does he has mild dyspnea such as walking long distances.   Review of Systems  All other systems reviewed and are negative.      Objective:   Physical Exam  Constitutional: He is oriented to person, place, and time. He appears well-developed and well-nourished. No distress.  HENT:  Head: Normocephalic.  Right Ear: External ear normal.  Left Ear: External ear normal.  Mouth/Throat: Oropharynx is clear and moist.  Eyes: Pupils are equal, round, and reactive to light. Right eye exhibits no discharge. Left eye exhibits no discharge.  Neck: Normal range of motion. Neck supple. No thyromegaly present.  Cardiovascular: Normal rate, regular rhythm, normal heart sounds and intact distal pulses.  No murmur heard. Pulmonary/Chest: Effort normal and breath sounds normal. No respiratory distress. He has no wheezes.  Abdominal: Soft. Bowel sounds are normal. He exhibits no distension. There is no tenderness.  Musculoskeletal: Normal range of motion. He exhibits no edema or tenderness.  Neurological: He is alert and oriented to person, place, and time. He has normal reflexes. No cranial nerve deficit.  Skin: Skin is warm and dry. No rash noted. No erythema.  Psychiatric: He has a normal mood and affect. His behavior is normal. Judgment and thought content normal.  Vitals reviewed.     BP (!) 137/59   Pulse (!) 55   Temp (!) 97 F (36.1 C) (Oral)   Ht 6\' 3"  (1.905 m)   Wt 176 lb (79.8 kg)   BMI 22.00 kg/m '    Assessment & Plan:  1. Viral upper respiratory tract infection - Take meds as prescribed -  Use a cool mist humidifier  -Use saline nose sprays frequently -Force fluids -For any cough or congestion  Use plain Mucinex- regular strength or max strength is fine -For fever or aces or pains- take tylenol or ibuprofen. -Throat lozenges if help -RTO if symptoms do not improve or worsen - fluticasone (FLONASE) 50 MCG/ACT nasal spray; Place 2 sprays into both nostrils daily.  Dispense: 16 g; Refill: 6  2. Hospital discharge follow-up Continue current medications   Jannifer Rodney, FNP

## 2018-01-09 NOTE — Patient Instructions (Signed)
Upper Respiratory Infection, Adult Most upper respiratory infections (URIs) are caused by a virus. A URI affects the nose, throat, and upper air passages. The most common type of URI is often called "the common cold." Follow these instructions at home:  Take medicines only as told by your doctor.  Gargle warm saltwater or take cough drops to comfort your throat as told by your doctor.  Use a warm mist humidifier or inhale steam from a shower to increase air moisture. This may make it easier to breathe.  Drink enough fluid to keep your pee (urine) clear or pale yellow.  Eat soups and other clear broths.  Have a healthy diet.  Rest as needed.  Go back to work when your fever is gone or your doctor says it is okay. ? You may need to stay home longer to avoid giving your URI to others. ? You can also wear a face mask and wash your hands often to prevent spread of the virus.  Use your inhaler more if you have asthma.  Do not use any tobacco products, including cigarettes, chewing tobacco, or electronic cigarettes. If you need help quitting, ask your doctor. Contact a doctor if:  You are getting worse, not better.  Your symptoms are not helped by medicine.  You have chills.  You are getting more short of breath.  You have brown or red mucus.  You have yellow or brown discharge from your nose.  You have pain in your face, especially when you bend forward.  You have a fever.  You have puffy (swollen) neck glands.  You have pain while swallowing.  You have white areas in the back of your throat. Get help right away if:  You have very bad or constant: ? Headache. ? Ear pain. ? Pain in your forehead, behind your eyes, and over your cheekbones (sinus pain). ? Chest pain.  You have long-lasting (chronic) lung disease and any of the following: ? Wheezing. ? Long-lasting cough. ? Coughing up blood. ? A change in your usual mucus.  You have a stiff neck.  You have  changes in your: ? Vision. ? Hearing. ? Thinking. ? Mood. This information is not intended to replace advice given to you by your health care provider. Make sure you discuss any questions you have with your health care provider. Document Released: 02/16/2008 Document Revised: 05/02/2016 Document Reviewed: 12/05/2013 Elsevier Interactive Patient Education  2018 Elsevier Inc.  

## 2018-01-29 NOTE — Progress Notes (Signed)
Cardiology Office Note  Date: 01/30/2018   ID: [name], Ryan 1934-12-13, MRN [id number]  PCP: [name], FNP  Primary Cardiologist: Nona Dell, MD   Chief Complaint  Patient presents with  . Coronary Artery Disease    History of Present Illness: Ryan [name] is an 83 y.o. male last seen in November 2018.  He presents for a routine visit.  He reports chronic dyspnea on exertion as before, no angina symptoms or palpitations.  Record review finds ER visit in late April with shortness of breath, was treated for possible URI at that time.  We went over his cardiac medications which include aspirin, Lipitor, Lasix, hydralazine, and Imdur.  He has an allergy to lisinopril and has not been placed on standing beta-blockers due to low resting heart rate.  We have continued conservative management of ischemic heart disease and cardiomyopathy, last LVEF 45 to 50% range.  I reviewed his most recent lab work in April.  Past Medical History:  Diagnosis Date  . Abnormal ECG   . Cardiomyopathy (HCC)    LVEF 45-50%  . Coronary atherosclerosis of native coronary artery    Based on Myoview - managing medically  . Dementia   . Essential hypertension, benign   . Type 2 diabetes mellitus (HCC)   . Vitamin B12 deficiency 05/01/2016    Past Surgical History:  Procedure Laterality Date  . EYE SURGERY Bilateral 08/2016   Dr. Sharl Ma, [place]  . HERNIA REPAIR    . HIP SURGERY    . KNEE SURGERY    . PROSTATE SURGERY      Current Outpatient Medications  Medication Sig Dispense Refill  . albuterol (PROVENTIL HFA;VENTOLIN HFA) 108 (90 Base) MCG/ACT inhaler Inhale 1-2 puffs into the lungs every 6 (six) hours as needed for wheezing or shortness of breath. 1 Inhaler 0  . Ascorbic Acid (VITAMIN C) 100 MG tablet Take 500 mg by mouth 2 (two) times daily.     Marland Kitchen aspirin EC 81 MG tablet Take 81 mg by mouth every other day.     Marland Kitchen atorvastatin (LIPITOR) 40 MG tablet TAKE ONE  TABLET BY MOUTH ONCE DAILY 90 tablet 1  . escitalopram (LEXAPRO) 5 MG tablet Take 1 tablet (5 mg total) daily by mouth. 90 tablet 1  . fluticasone (FLONASE) 50 MCG/ACT nasal spray Place 2 sprays into both nostrils daily. 16 g 6  . fluticasone furoate-vilanterol (BREO ELLIPTA) 100-25 MCG/INH AEPB Inhale 1 puff into the lungs daily. 60 each 3  . furosemide (LASIX) 20 MG tablet TAKE ONE TABLET BY MOUTH ONCE DAILY AT NOON 90 tablet 1  . Garlic 1000 MG CAPS Take 3 capsules by mouth every morning.     . Ginger 500 MG CAPS Take 1 capsule by mouth daily.     . hydrALAZINE (APRESOLINE) 25 MG tablet Take 1 tablet (25 mg total) by mouth 2 (two) times daily. 180 tablet 1  . ibuprofen (ADVIL,MOTRIN) 200 MG tablet Take 400 mg by mouth every 6 (six) hours as needed for moderate pain.    . isosorbide mononitrate (IMDUR) 30 MG 24 hr tablet TAKE ONE-HALF TABLET BY MOUTH ONCE DAILY 45 tablet 0  . Multiple Vitamin (VITAMIN E/FOLIC ACID/B-6/B-12 PO) Take 10 mg daily by mouth.    . potassium chloride (K-DUR) 10 MEQ tablet Take 1 tablet (10 mEq total) by mouth daily. 90 tablet 3  . vitamin B-12 (CYANOCOBALAMIN) 1000 MCG tablet Take 0.5 tablets (500 mcg total) by mouth daily. (  Patient taking differently: Take 1,000 mcg by mouth 3 (three) times daily. )     No current facility-administered medications for this visit.    Allergies:  Lisinopril   Social History: The patient  reports that he quit smoking about 51 years ago. His smoking use included cigarettes. He started smoking about 67 years ago. He has a 15.00 pack-year smoking history. He has never used smokeless tobacco. He reports that he does not drink alcohol or use drugs.   ROS:  Please see the history of present illness. Otherwise, complete review of systems is positive for gait instability, uses a cane.  All other systems are reviewed and negative.   Physical Exam: VS:  BP 138/64   Pulse 61   Ht  (1.905 m)   Wt 178 lb (80.7 kg)   SpO2 97%   BMI 22.25  kg/m , BMI Body mass index is 22.25 kg/m.  Wt Readings from Last 3 Encounters:  01/30/18 178 lb (80.7 kg)  01/09/18 176 lb (79.8 kg)  01/05/18 176 lb (79.8 kg)    General: Elderly male in no distress, using a cane. HEENT: Conjunctiva and lids normal, oropharynx clear. Neck: Supple, no elevated JVP or carotid bruits, no thyromegaly. Lungs: Clear to auscultation, nonlabored breathing at rest. Cardiac: Regular rate and rhythm, no S3, soft systolic murmur. Abdomen: Soft, nontender, bowel sounds present. Extremities: Trace ankle edema, distal pulses 2+. Skin: Warm and dry. Musculoskeletal: No kyphosis. Neuropsychiatric: Alert and oriented x3, affect grossly appropriate.  ECG: I personally reviewed the tracing from 01/05/2018 which showed sinus rhythm with diffuse nonspecific ST changes, possible old inferolateral infarct pattern.  Recent Labwork: 07/29/2017: TSH 1.810 09/19/2017: ALT 13; AST 21 01/05/2018: BUN 21; Creatinine, Ser 1.09; Hemoglobin 12.8; Platelets 146; Potassium 3.9; Sodium 141     Component Value Date/Time   CHOL 134 02/01/2017 0937   TRIG 147 02/01/2017 0937   TRIG 210 (H) 06/24/2014 1424   HDL 44 02/01/2017 0937   HDL 45 06/24/2014 1424   CHOLHDL 3.0 02/01/2017 0937   CHOLHDL 3.0 07/08/2012 0551   VLDL 22 07/08/2012 0551   LDLCALC 61 02/01/2017 0937   LDLCALC 72 06/24/2014 1424    Other Studies Reviewed Today:  Echocardiogram 05/02/2015: Study Conclusions  - Left ventricle: Technically difficult study. There is hypokinesis  of base inferior segment. Inferolateral wall difficult to assess.  There is hypokinesis of this wall. The EF is 45-50% The cavity  size was normal. Wall thickness was increased in a pattern of  mild LVH. - Mitral valve: Calcified annulus. There was no significant  regurgitation. - Right ventricle: The cavity size was normal. Systolic function  was normal.  Assessment and Plan:  1.  Ischemic heart disease, no reported angina  symptoms on medical therapy with stable dyspnea on exertion.  LVEF 45 to 50% range by last assessment, he continues on Lasix with relatively stable weights and no progressive leg edema.  Continue current regimen and observation, we have been managing conservatively.  2.  Essential hypertension, blood pressure control is adequate today.  Current medicines were reviewed with the patient today.  Disposition: Follow-up in 6 months.  Signed, Jonelle Sidle, MD, [facility] 01/30/2018 1:17 PM    [facility] [address] [place], [place], [place] [address] Phone: [phone]; Fax: [phone]

## 2018-01-30 ENCOUNTER — Encounter: Payer: Self-pay | Admitting: Cardiology

## 2018-01-30 ENCOUNTER — Ambulatory Visit (INDEPENDENT_AMBULATORY_CARE_PROVIDER_SITE_OTHER): Payer: Medicare Other | Admitting: Cardiology

## 2018-01-30 VITALS — BP 138/64 | HR 61 | Ht 75.0 in | Wt 178.0 lb

## 2018-01-30 DIAGNOSIS — I10 Essential (primary) hypertension: Secondary | ICD-10-CM

## 2018-01-30 DIAGNOSIS — I25119 Atherosclerotic heart disease of native coronary artery with unspecified angina pectoris: Secondary | ICD-10-CM

## 2018-01-30 NOTE — Patient Instructions (Signed)

## 2018-02-16 ENCOUNTER — Other Ambulatory Visit: Payer: Self-pay | Admitting: Cardiology

## 2018-02-21 ENCOUNTER — Ambulatory Visit: Payer: Medicare Other | Admitting: Family

## 2018-02-21 ENCOUNTER — Ambulatory Visit (INDEPENDENT_AMBULATORY_CARE_PROVIDER_SITE_OTHER): Payer: Medicare Other | Admitting: Family

## 2018-02-21 ENCOUNTER — Encounter: Payer: Self-pay | Admitting: Family

## 2018-02-21 VITALS — BP 125/63 | HR 58 | Temp 96.7°F | Ht 71.0 in | Wt 180.6 lb

## 2018-02-21 DIAGNOSIS — R0602 Shortness of breath: Secondary | ICD-10-CM

## 2018-02-21 DIAGNOSIS — J439 Emphysema, unspecified: Secondary | ICD-10-CM

## 2018-02-21 DIAGNOSIS — G309 Alzheimer's disease, unspecified: Secondary | ICD-10-CM

## 2018-02-21 DIAGNOSIS — Z0000 Encounter for general adult medical examination without abnormal findings: Secondary | ICD-10-CM

## 2018-02-21 DIAGNOSIS — I429 Cardiomyopathy, unspecified: Secondary | ICD-10-CM

## 2018-02-21 DIAGNOSIS — E538 Deficiency of other specified B group vitamins: Secondary | ICD-10-CM

## 2018-02-21 DIAGNOSIS — F320 Major depressive disorder, single episode, mild: Secondary | ICD-10-CM

## 2018-02-21 DIAGNOSIS — E785 Hyperlipidemia, unspecified: Secondary | ICD-10-CM

## 2018-02-21 DIAGNOSIS — I25119 Atherosclerotic heart disease of native coronary artery with unspecified angina pectoris: Secondary | ICD-10-CM | POA: Diagnosis not present

## 2018-02-21 DIAGNOSIS — I10 Essential (primary) hypertension: Secondary | ICD-10-CM

## 2018-02-21 DIAGNOSIS — F0280 Dementia in other diseases classified elsewhere without behavioral disturbance: Secondary | ICD-10-CM

## 2018-02-21 NOTE — Patient Instructions (Signed)
Deconditioning °Deconditioning refers to the changes in your body that occur during a period of inactivity. The changes happen in your heart, lungs, and muscles. They decrease your ability to be active, and they make you feel tired and weak. °There are three stages of deconditioning: °· Mild deconditioning. At this stage, you will notice a change in your ability to do your usual exercise activities, such as running, biking, or swimming. °· Moderate deconditioning. At this stage, you will notice a change in your ability to do normal everyday activities, such as walking, grocery shopping, and doing chores. °· Severe deconditioning. At this stage, you will notice a change in your ability to do minimal activity or normal self-care. ° °Deconditioning can occur after only a few days of inactivity. The longer the period of inactivity, the more severe the deconditioning will be, and the longer it will take to return to your previous level of functioning. °What are the causes? °Deconditioning is often caused by inactivity due to: °· Illnesses, such as cancer, stroke, heart attack, fibromyalgia, and chronic fatigue syndrome. °· Injuries, especially back injuries, broken bones, and ligament and tendon injuries. °· A long stay in the hospital. °· Pregnancy, especially if long periods of bed rest are needed. ° °What increases the risk? °This condition is more likely to develop in: °· People who are hospitalized. °· People on bed rest. °· People who are obese. °· People with poor nutrition. °· Elderly adults. °· People with injuries or illnesses that interfere with movement and activity. ° °What are the signs or symptoms? °Symptoms of deconditioning include: °· Weakness. °· Tiredness. °· Shortness of breath with minor exertion. °· A faster-than-normal heartbeat. You may not notice this without taking your pulse. °· Pain or discomfort with activity. °· Decreased strength. °· Decreased sense of balance. °· Decreased  endurance. °· Difficulty doing your usual forms of exercise. °· Difficulty doing activities of daily living, such as grocery shopping or chores. °· Difficulty walking around the house and doing basic self-care, such as getting to the bathroom, preparing meals, or doing laundry. ° °How is this diagnosed? °Deconditioning is diagnosed based on your medical history and a physical exam. During the physical exam, your health care provider will check for signs of deconditioning, such as: °· Decreased size of muscles. °· Decreased strength. °· Trouble with balance. °· Shortness of breath or abnormally increased heart rate after minor exertion. ° °How is this treated? °Treatment for deconditioning usually involves following a structured exercise program in which activity is increased gradually. Your health care provider will determine which exercises are right for you. The exercise program will likely include aerobic exercise and strength training: °· Aerobic exercise helps improve the functioning of the heart and lungs as well as the muscles. °· Strength training helps improve muscle size and strength. ° °Both of these types of exercise will improve your endurance. You may be referred to a physical therapist who can create a safe strengthening program for you to follow. °Follow these instructions at home: °· Follow the exercise program that is recommended by your health care provider or physical therapist. °· Do not increase your exercise any faster than directed. °· Eat a healthy diet. °· Do not use any products that contain nicotine or tobacco, such as cigarettes and e-cigarettes. If you need help quitting, ask your health care provider. °· Take over-the-counter and prescription medicines only as told by your health care provider. °· Keep all follow-up visits as told by your health care   provider. This is important. °Contact a health care provider if: °· You are not able to carry out the prescribed exercise program. °· You  are becoming more and more fatigued and weak. °· You become light-headed when rising to a sitting or standing position. °· Your level of endurance decreases after it has improved. °Get help right away if: °· You have chest pain. °· You are very short of breath. °· You have any episodes of passing out. °This information is not intended to replace advice given to you by your health care provider. Make sure you discuss any questions you have with your health care provider. °Document Released: 01/14/2014 Document Revised: 03/19/2016 Document Reviewed: 11/29/2015 °Elsevier Interactive Patient Education © 2018 [employer] ° °

## 2018-02-21 NOTE — Progress Notes (Signed)
Subjective:    Patient ID: Ryan [name], male    DOB: 07-02-1935, 83 y.o.   MRN: [id number]  Chief Complaint  Patient presents with  . Annual Exam   Pt presents to the office today for CPE. PT is a poor historian and has dementia.Per EPIC pt has been seen by [facility] on 05/20/19with stable CAD, CHF, and cardiomyopathy with LVEF 45-50%.  Hypertension  This is a chronic problem. The current episode started more than 1 year ago. The problem has been resolved since onset. The problem is controlled. Associated symptoms include shortness of breath. Pertinent negatives include no headaches, malaise/fatigue or peripheral edema. Risk factors for coronary artery disease include dyslipidemia and male gender. The current treatment provides moderate improvement. Hypertensive end-organ damage includes CAD/MI and heart failure.  Hyperlipidemia  This is a chronic problem. The current episode started more than 1 year ago. The problem is controlled. Recent lipid tests were reviewed and are normal. Associated symptoms include shortness of breath. Current antihyperlipidemic treatment includes statins. The current treatment provides significant improvement of lipids. Risk factors for coronary artery disease include dyslipidemia, male sex and hypertension.  Anemia  Presents for follow-up visit. There has been no confusion or malaise/fatigue. Past medical history includes heart failure.  Depression         This is a chronic problem.  The current episode started more than 1 year ago.   The onset quality is gradual.   The problem occurs intermittently.  The problem has been waxing and waning since onset.  Associated symptoms include irritable and sad.  Associated symptoms include no helplessness, no hopelessness, no restlessness, no headaches and no suicidal ideas ("once awhile, but I'm not").  Past treatments include SSRIs - Selective serotonin reuptake inhibitors.     Review of Systems  Constitutional:  Negative for malaise/fatigue.  Respiratory: Positive for shortness of breath.   Neurological: Negative for headaches.  Psychiatric/Behavioral: Positive for depression. Negative for confusion and suicidal ideas ("once awhile, but I'm not").  All other systems reviewed and are negative.      Objective:   Physical Exam  Constitutional: He is oriented to person, place, and time. He appears well-developed and well-nourished. He is irritable. No distress.  HENT:  Head: Normocephalic.  Right Ear: External ear normal.  Left Ear: External ear normal.  Mouth/Throat: Oropharynx is clear and moist.  Eyes: Pupils are equal, round, and reactive to light. Right eye exhibits no discharge. Left eye exhibits no discharge.  Neck: Normal range of motion. Neck supple. No thyromegaly present.  Cardiovascular: Normal rate, regular rhythm, normal heart sounds and intact distal pulses.  No murmur heard. Pulmonary/Chest: Effort normal and breath sounds normal. No respiratory distress. He has no wheezes.  Abdominal: Soft. Bowel sounds are normal. He exhibits no distension. There is no tenderness.  Musculoskeletal: Normal range of motion. He exhibits no edema or tenderness.  Neurological: He is alert and oriented to person, place, and time. He has normal reflexes. No cranial nerve deficit.  Skin: Skin is warm and dry. No rash noted. No erythema.  Psychiatric: His behavior is normal. Thought content normal. His mood appears anxious. He expresses impulsivity.  Vitals reviewed.     BP 125/63   Pulse (!) 58   Temp (!) 96.7 F (35.9 C) (Oral)   Ht '5\' 11"'$  (1.803 m)   Wt 180 lb 9.6 oz (81.9 kg)   BMI 25.19 kg/m      Assessment & Plan:  Ryan [name] comes  in today with chief complaint of Annual Exam   Diagnosis and orders addressed:  1. Atherosclerosis of native coronary artery of native heart with angina pectoris ([place]) - CMP14+EGFR - Lipid panel - CBC with Differential/Platelet  2. Essential  hypertension, benign - CMP14+EGFR - CBC with Differential/Platelet  3. Cardiomyopathy, unspecified type ([place]) - CMP14+EGFR - CBC with Differential/Platelet  4. Pulmonary emphysema, unspecified emphysema type ([place]) - CMP14+EGFR - CBC with Differential/Platelet  5. Alzheimer's dementia without behavioral disturbance, unspecified timing of dementia onset - CMP14+EGFR - CBC with Differential/Platelet  6. Hyperlipidemia, unspecified hyperlipidemia type - CMP14+EGFR - Lipid panel - CBC with Differential/Platelet  7. Vitamin B12 deficiency - CMP14+EGFR - CBC with Differential/Platelet - Vitamin B12  8. Depression, major, single episode, mild ([facility]) - CMP14+EGFR - CBC with Differential/Platelet  9. Annual physical exam - CMP14+EGFR - Lipid panel - CBC with Differential/Platelet - TSH - VITAMIN D 25 Hydroxy (Vit-D Deficiency, Fractures)  10. SOB (shortness of breath) - CMP14+EGFR - CBC with Differential/Platelet   Labs pending Health Maintenance reviewed Diet and exercise encouraged  Follow up plan:  3 months    Evelina Dun, FNP

## 2018-02-22 LAB — ANEMIA PROFILE B
BASOS ABS: 0 10*3/uL (ref 0.0–0.2)
Basos: 0 %
EOS (ABSOLUTE): 0.1 10*3/uL (ref 0.0–0.4)
Eos: 1 %
FOLATE: 11 ng/mL (ref >3.0)
Ferritin: 144 ng/mL (ref 30–400)
Hematocrit: 38.2 % (ref 37.5–51.0)
Hemoglobin: 13.2 g/dL (ref 13.0–17.7)
IRON: 67 ug/dL (ref 38–169)
Immature Grans (Abs): 0 10*3/uL (ref 0.0–0.1)
Immature Granulocytes: 0 %
Iron Saturation: 24 % (ref 15–55)
LYMPHS ABS: 2.4 10*3/uL (ref 0.7–3.1)
Lymphs: 30 %
MCH: 32.2 pg (ref 26.6–33.0)
MCHC: 34.6 g/dL (ref 31.5–35.7)
MCV: 93 fL (ref 79–97)
Monocytes Absolute: 0.7 10*3/uL (ref 0.1–0.9)
Monocytes: 9 %
NEUTROS ABS: 4.9 10*3/uL (ref 1.4–7.0)
Neutrophils: 60 %
PLATELETS: 150 10*3/uL (ref 150–450)
RBC: 4.1 x10E6/uL — ABNORMAL LOW (ref 4.14–5.80)
RDW: 14.8 % (ref 12.3–15.4)
Retic Ct Pct: 0.8 % (ref 0.6–2.6)
TIBC: 281 ug/dL (ref 250–450)
UIBC: 214 ug/dL (ref 111–343)
WBC: 8.2 10*3/uL (ref 3.4–10.8)

## 2018-02-22 LAB — LIPID PANEL
CHOLESTEROL TOTAL: 142 mg/dL (ref 100–199)
Chol/HDL Ratio: 2.8 ratio (ref 0.0–5.0)
HDL: 51 mg/dL (ref >39)
LDL CALC: 63 mg/dL (ref 0–99)
TRIGLYCERIDES: 138 mg/dL (ref 0–149)
VLDL CHOLESTEROL CAL: 28 mg/dL (ref 5–40)

## 2018-02-22 LAB — CMP14+EGFR
ALK PHOS: 72 IU/L (ref 39–117)
ALT: 10 IU/L (ref 0–44)
AST: 18 IU/L (ref 0–40)
Albumin/Globulin Ratio: 1.6 (ref 1.2–2.2)
Albumin: 4 g/dL (ref 3.5–4.7)
BUN/Creatinine Ratio: 22 (ref 10–24)
BUN: 24 mg/dL (ref 8–27)
Bilirubin Total: 0.3 mg/dL (ref 0.0–1.2)
CO2: 20 mmol/L (ref 20–29)
CREATININE: 1.11 mg/dL (ref 0.76–1.27)
Calcium: 9.6 mg/dL (ref 8.6–10.2)
Chloride: 107 mmol/L — ABNORMAL HIGH (ref 96–106)
GFR calc Af Amer: 72 mL/min/{1.73_m2} (ref >59)
GFR calc non Af Amer: 62 mL/min/{1.73_m2} (ref >59)
Globulin, Total: 2.5 g/dL (ref 1.5–4.5)
Glucose: 102 mg/dL — ABNORMAL HIGH (ref 65–99)
Potassium: 4.4 mmol/L (ref 3.5–5.2)
Sodium: 144 mmol/L (ref 134–144)
Total Protein: 6.5 g/dL (ref 6.0–8.5)

## 2018-02-22 LAB — VITAMIN D 25 HYDROXY (VIT D DEFICIENCY, FRACTURES): Vit D, 25-Hydroxy: 24.2 ng/mL — ABNORMAL LOW (ref 30.0–100.0)

## 2018-02-22 LAB — TSH: TSH: 1.95 u[IU]/mL (ref 0.450–4.500)

## 2018-02-23 ENCOUNTER — Telehealth: Payer: Self-pay | Admitting: Family

## 2018-02-23 NOTE — Telephone Encounter (Signed)
Patient was called .

## 2018-02-24 ENCOUNTER — Telehealth: Payer: Self-pay | Admitting: Family

## 2018-02-24 ENCOUNTER — Other Ambulatory Visit: Payer: Self-pay | Admitting: *Deleted

## 2018-02-24 MED ORDER — VITAMIN D (ERGOCALCIFEROL) 1.25 MG (50000 UNIT) PO CAPS: 50000.0000 [IU] | ORAL_CAPSULE | ORAL | 4 refills | Status: AC

## 2018-02-24 NOTE — Telephone Encounter (Signed)
Aware.  Script was sent to pharmacy.

## 2018-03-01 ENCOUNTER — Telehealth: Payer: Self-pay | Admitting: Cardiology

## 2018-03-01 NOTE — Telephone Encounter (Signed)
Patient states he has not been taking imdur due to not being able to cut medication in half. Patient has no family that can help him. Contacted pharmacy and they stated they are no longer allowed to cut medication in half for patients. Will forward to provider for recommendation

## 2018-03-01 NOTE — Telephone Encounter (Signed)
Patient requesting phone call due to him stopping his Isosorbide due to not wanting to "break them in half" anymore. / tg

## 2018-03-01 NOTE — Telephone Encounter (Signed)
   At the time of his last visit, his BP was overall stable. If he has not experienced any episodes of chest discomfort since stopping the medication, he can remain off of it. If he does start to notice any changes in his symptoms, would recommended taking the full tablet of 30mg  daily as this is still a relatively low dose of the medication and I image he would tolerate this better than long-acting patches.   Signed, Ellsworth Lennox, PA-C 03/01/2018, 4:34 PM Pager: [phone]

## 2018-03-01 NOTE — Telephone Encounter (Signed)
LMTCB

## 2018-03-02 MED ORDER — ISOSORBIDE MONONITRATE ER 30 MG PO TB24: 30.0000 mg | ORAL_TABLET | Freq: Every day | ORAL | 0 refills | Status: DC

## 2018-03-02 NOTE — Telephone Encounter (Signed)
Patient would just like to take the whole pill. Rx sent to [facility] pharmacy

## 2018-03-08 DIAGNOSIS — H5000 Unspecified esotropia: Secondary | ICD-10-CM | POA: Diagnosis not present

## 2018-03-08 DIAGNOSIS — Z961 Presence of intraocular lens: Secondary | ICD-10-CM | POA: Diagnosis not present

## 2018-03-08 DIAGNOSIS — H35372 Puckering of macula, left eye: Secondary | ICD-10-CM | POA: Diagnosis not present

## 2018-03-08 DIAGNOSIS — H01001 Unspecified blepharitis right upper eyelid: Secondary | ICD-10-CM | POA: Diagnosis not present

## 2018-03-08 DIAGNOSIS — H532 Diplopia: Secondary | ICD-10-CM | POA: Diagnosis not present

## 2018-03-13 ENCOUNTER — Encounter: Payer: Self-pay | Admitting: Family

## 2018-03-13 ENCOUNTER — Ambulatory Visit (INDEPENDENT_AMBULATORY_CARE_PROVIDER_SITE_OTHER): Payer: Medicare Other | Admitting: Family

## 2018-03-13 VITALS — BP 119/70 | HR 58 | Temp 97.0°F | Ht 71.0 in | Wt 178.8 lb

## 2018-03-13 DIAGNOSIS — F0280 Dementia in other diseases classified elsewhere without behavioral disturbance: Secondary | ICD-10-CM | POA: Diagnosis not present

## 2018-03-13 DIAGNOSIS — Z9181 History of falling: Secondary | ICD-10-CM

## 2018-03-13 DIAGNOSIS — Z9119 Patient's noncompliance with other medical treatment and regimen: Secondary | ICD-10-CM | POA: Diagnosis not present

## 2018-03-13 DIAGNOSIS — R269 Unspecified abnormalities of gait and mobility: Secondary | ICD-10-CM | POA: Diagnosis not present

## 2018-03-13 DIAGNOSIS — Z91199 Patient's noncompliance with other medical treatment and regimen due to unspecified reason: Secondary | ICD-10-CM

## 2018-03-13 DIAGNOSIS — M1711 Unilateral primary osteoarthritis, right knee: Secondary | ICD-10-CM | POA: Diagnosis not present

## 2018-03-13 DIAGNOSIS — G309 Alzheimer's disease, unspecified: Secondary | ICD-10-CM | POA: Diagnosis not present

## 2018-03-13 NOTE — Patient Instructions (Signed)

## 2018-03-13 NOTE — Progress Notes (Signed)
   Subjective:    Patient ID: Ryan [name], male    DOB: Jan 14, 1935, 83 y.o.   MRN: [id number]  Chief Complaint  Patient presents with  . requesting home health care    HPI PT presents to the office today requesting home health care. States he is requesting help with his medications and chores around his home.   Pt does have Dementia and memory problems. We had seen home health in the past to his home, but he refused to let them into his home.   PT complaining of right knee pain. Pt states he has has this pain for years, but the pain has become worse. He reports intermittent aching pain that is worse when he is walking that is 7-8 out 10. .    Review of Systems  Constitutional: Positive for fatigue.  All other systems reviewed and are negative.      Objective:   Physical Exam  Constitutional: He is oriented to person, place, and time. He appears well-developed and well-nourished. No distress.  HENT:  Head: Normocephalic.  Right Ear: External ear normal.  Left Ear: External ear normal.  Mouth/Throat: Oropharynx is clear and moist.  Eyes: Pupils are equal, round, and reactive to light. Right eye exhibits no discharge. Left eye exhibits no discharge.  Neck: Normal range of motion. Neck supple. No thyromegaly present.  Cardiovascular: Normal rate, regular rhythm, normal heart sounds and intact distal pulses.  No murmur heard. Pulmonary/Chest: Effort normal and breath sounds normal. No respiratory distress. He has no wheezes.  Abdominal: Soft. Bowel sounds are normal. He exhibits no distension. There is no tenderness.  Musculoskeletal: Normal range of motion. He exhibits no edema or tenderness.  Neurological: He is alert and oriented to person, place, and time. He has normal reflexes. No cranial nerve deficit.  Skin: Skin is warm and dry. No rash noted. No erythema.  Psychiatric: He has a normal mood and affect. His behavior is normal. Judgment and thought content normal.  Cognition and memory are impaired.  Vitals reviewed.     BP 119/70   Pulse (!) 58   Temp (!) 97 F (36.1 C)   Ht 5\' 11"  (1.803 m)   Wt 178 lb 12.8 oz (81.1 kg)   BMI 24.94 kg/m      Assessment & Plan:  Ryan [name] comes in today with chief complaint of requesting home health care   Diagnosis and orders addressed:  1. Alzheimer's dementia without behavioral disturbance, unspecified timing of dementia onset - Home Health - Face-to-face encounter (required for Medicare/Medicaid patients)  2. At high risk for falls - Home Health - Face-to-face encounter (required for Medicare/Medicaid patients)  3. Gait abnormality - Home Health - Face-to-face encounter (required for Medicare/Medicaid patients)  4. Primary osteoarthritis of right knee - Home Health - Face-to-face encounter (required for Medicare/Medicaid patients)  5. Noncompliance - Home Health - Face-to-face encounter (required for Medicare/Medicaid patients)   Will order Home health today Pt reports he has Home Health coming 03/15/18 Fall preventions discussed Keep chronic follow up Continue medications   Jannifer Rodney, FNP

## 2018-04-18 ENCOUNTER — Ambulatory Visit (INDEPENDENT_AMBULATORY_CARE_PROVIDER_SITE_OTHER): Payer: Medicare Other | Admitting: *Deleted

## 2018-04-18 ENCOUNTER — Encounter: Payer: Self-pay | Admitting: *Deleted

## 2018-04-18 VITALS — BP 130/72 | HR 63 | Ht 71.0 in | Wt 178.0 lb

## 2018-04-18 DIAGNOSIS — Z0000 Encounter for general adult medical examination without abnormal findings: Secondary | ICD-10-CM | POA: Diagnosis not present

## 2018-04-18 NOTE — Patient Instructions (Signed)
  Ryan [name] , Thank you for taking time to come for your Medicare Wellness Visit. I appreciate your ongoing commitment to your health goals. Please review the following plan we discussed and let me know if I can assist you in the future.   These are the goals we discussed: Goals    . DIET - INCREASE WATER INTAKE    . Exercise 3x per week (30 min per time)       This is a list of the screening recommended for you and due dates:  Health Maintenance  Topic Date Due  . Flu Shot  04/13/2018  . Tetanus Vaccine  04/19/2019*  . Pneumonia vaccines  Completed  *Topic was postponed. The date shown is not the original due date.

## 2018-04-18 NOTE — Progress Notes (Addendum)
Subjective:   Ryan [name] is a 83 y.o. male who presents for a Medicare Annual Wellness Visit. Ryan [name] lives at home alone. He is divorced but states that he and his ex wife have remained friends and that she checks on him every few days. He does not have any children but says that he and his ex wife helped to raise her 3 nieces. One of them recently passed away but he stays in contact with the other two. He has a neighbor across the street that mows his yard.  He is a member of the [employer] an they meet every 2nd Saturday of the month. He also goes to church regularly.   Review of Systems    Patient reports that his overall health is unchanged compared to last year.  Cardiac Risk Factors include: advanced age (>26men, >26 women);dyslipidemia;hypertension;male gender;sedentary lifestyle;smoking/ tobacco exposure  Neuro: Diagnosis of alzhemier's. Unable to recall what he ate for supper last night.  HEENT: states that he had a bad ear infection in left ear years ago that caused damage and as a result his balance is off  All other systems negative       Current Medications (verified) Outpatient Encounter Medications as of 04/18/2018  Medication Sig  . albuterol (PROVENTIL HFA;VENTOLIN HFA) 108 (90 Base) MCG/ACT inhaler Inhale 1-2 puffs into the lungs every 6 (six) hours as needed for wheezing or shortness of breath.  . Ascorbic Acid (VITAMIN C) 100 MG tablet Take 500 mg by mouth 2 (two) times daily.   Marland Kitchen aspirin EC 81 MG tablet Take 81 mg by mouth every other day.   . escitalopram (LEXAPRO) 5 MG tablet Take 1 tablet (5 mg total) daily by mouth.  . fluticasone (FLONASE) 50 MCG/ACT nasal spray Place 2 sprays into both nostrils daily.  . fluticasone furoate-vilanterol (BREO ELLIPTA) 100-25 MCG/INH AEPB Inhale 1 puff into the lungs daily.  . furosemide (LASIX) 20 MG tablet TAKE 1 TABLET BY MOUTH ONCE DAILY AT NOON  . Garlic 1000 MG CAPS Take 3 capsules by mouth every  morning.   . Ginger 500 MG CAPS Take 1 capsule by mouth daily.   . hydrALAZINE (APRESOLINE) 25 MG tablet Take 1 tablet (25 mg total) by mouth 2 (two) times daily.  Marland Kitchen ibuprofen (ADVIL,MOTRIN) 200 MG tablet Take 400 mg by mouth every 6 (six) hours as needed for moderate pain.  . isosorbide mononitrate (IMDUR) 30 MG 24 hr tablet Take 1 tablet (30 mg total) by mouth daily.  . Multiple Vitamin (VITAMIN E/FOLIC ACID/B-6/B-12 PO) Take 10 mg daily by mouth.  . potassium chloride (K-DUR) 10 MEQ tablet Take 1 tablet (10 mEq total) by mouth daily.  . vitamin B-12 (CYANOCOBALAMIN) 1000 MCG tablet Take 0.5 tablets (500 mcg total) by mouth daily. (Patient taking differently: Take 1,000 mcg by mouth 3 (three) times daily. )  . Vitamin D, Ergocalciferol, (DRISDOL) 50000 units CAPS capsule Take 1 capsule (50,000 Units total) by mouth every 7 (seven) days.  . [DISCONTINUED] atorvastatin (LIPITOR) 40 MG tablet TAKE ONE TABLET BY MOUTH ONCE DAILY   No facility-administered encounter medications on file as of 04/18/2018.     Allergies (verified) Lisinopril   History: Past Medical History:  Diagnosis Date  . Abnormal ECG   . Cardiomyopathy (HCC)    LVEF 45-50%  . Coronary atherosclerosis of native coronary artery    Based on Myoview - managing medically  . Dementia   . Essential hypertension, benign   .  Type 2 diabetes mellitus (HCC)   . Vitamin B12 deficiency 05/01/2016   Past Surgical History:  Procedure Laterality Date  . EYE SURGERY Bilateral 08/2016   Dr. Sharl Ma, [place]  . HERNIA REPAIR    . HIP SURGERY    . KNEE SURGERY    . PROSTATE SURGERY     Family History  Problem Relation Age of Onset  . Heart attack Mother   . Emphysema Mother   . COPD Father   . Alzheimer's disease Sister   . Heart disease Brother   . Diabetes Paternal Grandfather   . COPD Sister   . Cancer Sister        breast  . AAA (abdominal aortic aneurysm) Brother   . Heart attack Brother    Social History    Socioeconomic History  . Marital status: Divorced    Spouse name: Not on file  . Number of children: 0  . Years of education: 24  . Highest education level: 12th grade  Occupational History  . Occupation: Retired    Comment: Ship broker for 30 years  Social Needs  . Financial resource strain: Not very hard  . Food insecurity:    Worry: Never true    Inability: Never true  . Transportation needs:    Medical: No    Non-medical: No  Tobacco Use  . Smoking status: Former Smoker    Packs/day: 1.00    Years: 15.00    Pack years: 15.00    Types: Cigarettes    Start date: 01/12/1951    Last attempt to quit: 01/12/1967    Years since quitting: 51.2  . Smokeless tobacco: Never Used  Substance and Sexual Activity  . Alcohol use: No    Alcohol/week: 0.0 oz  . Drug use: No  . Sexual activity: Yes  Lifestyle  . Physical activity:    Days per week: 0 days    Minutes per session: 0 min  . Stress: Only a little  Relationships  . Social connections:    Talks on phone: More than three times a week    Gets together: More than three times a week    Attends religious service: More than 4 times per year    Active member of club or organization: Yes    Attends meetings of clubs or organizations: More than 4 times per year    Relationship status: Divorced  Other Topics Concern  . Not on file  Social History Narrative   Lives alone.   Right-handed.   3 cups coffee and 3 glasses of tea per day.    Tobacco Use No.  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 N  Vision? N  Difficulty concentrating or making decisions? Y  Comment alzheimer's disease  Walking or climbing stairs? Y  Comment has problems with balance. uses a cane  Dressing or bathing? N  Doing errands, shopping? N  Preparing Food  and eating ? N  Using the Toilet? N  In the past six months, have you accidently leaked urine? N  Do you have problems with loss of bowel control? N  Managing your Medications? Y  Comment has trouble remembering to take his medications sometimes. he does try to use a system. Puts bottles upside down when he's taken them  Managing your Finances? N  Housekeeping or managing your Housekeeping? Y  Comment does his own but gives out  Some recent data might be hidden     Diet Fixes some of his own meals and snacks but eats out frequently. Eats about 3 meals a day. He had eggs at [facility] for breakfast today. Only drinks about one bottle of water a day.   Exercise Current Exercise Habits: The patient does not participate in regular exercise at present, Exercise limited by: neurologic condition(s)    Depression Screen PHQ 2/9 Scores 04/18/2018 03/13/2018 02/21/2018 01/09/2018  PHQ - 2 Score PHQ- 9 Score Fall Risk Fall Risk  04/18/2018 03/13/2018 02/21/2018 01/09/2018 10/21/2017  Falls in the past year? Yes No No Yes Yes  Number falls in past yr: 1 - 1 1 -  Injury with Fall? No - Yes Yes No  Comment - - - no -  Risk Factor Category  - - - - -  Risk for fall due to : History of fall(s);Impaired mobility;Mental status change;Impaired balance/gait - - - -  Follow up Falls prevention discussed - - - -    Safety Is the patient's home free of loose throw rugs in walkways, pet beds, electrical cords, etc?   yes      Grab bars in the bathroom? no      Walkin shower? yes      Shower Seat? no      Handrails on the  stairs?   yes      Adequate lighting?   yes  Patient Care Team: [name], FNP as PCP - General (Family Medicine) Jonelle Sidle, MD (Cardiology)  Hospitalizations, surgeries, and ER visits in previous 12 months ED visit 01/05/18 for URI. No other hospitalizations, ER visits, or surgeries this past year.   Objective:    Today's Vitals   04/18/18 1134  BP: 130/72  Pulse: 63  Weight: 178 lb (80.7 kg)  Height:  (1.803 m)   Body mass index is 24.83 kg/m.  Advanced Directives 04/18/2018 12/21/2016 06/29/2016 04/30/2016 04/30/2016 05/01/2015 01/30/2014  Does Patient Have a Medical Advance Directive? Yes No No No No No Patient has advance directive, copy not in chart  Type of Advance Directive Healthcare Power of [place];Living will - - - - - Living will  Does patient want to make changes to medical advance directive? - - - - - - No change requested  Copy of Healthcare Power of Attorney in Chart? No - copy requested - - - - - Copy requested from family  Would patient like information on creating a medical advance directive? - Yes (MAU/Ambulatory/Procedural Areas - Information given) No - patient declined information No - patient declined information No - patient declined information - -  Pre-existing out of facility DNR order (yellow form or pink MOST form) - - - - - - No    Hearing/Vision  normal or No deficits noted during visit.   Cognitive Function: MMSE - Mini Mental State Exam 12/20/2017 12/21/2016 05/12/2015  Orientation to time 5  5 4  Orientation to Place 4 5 5   Registration 3 3 3   Attention/ Calculation 2 4 5   Recall 0 1 1  Language- name 2 objects 2 2 2   Language- repeat 1 1 1   Language- follow 3 step command 3 3 3   Language- read & follow direction 1 1 1   Write a sentence 1 1 1   Copy design 0 1 1  Total score 22 27 27   NOT ATTEMPTED TODAY     Normal Cognitive Function Screening: No: Did not attempt again today due to diagnosis of Alzheimer's and inability  to recall what he had for supper last night.     Immunizations and Health Maintenance Immunization History  Administered Date(s) Administered  . Pneumococcal Conjugate-13 05/27/2016  . Pneumococcal Polysaccharide-23 07/08/2012   Health Maintenance Due  Topic Date Due  . INFLUENZA VACCINE  04/13/2018   Health Maintenance  Topic Date Due  . INFLUENZA VACCINE  04/13/2018  . TETANUS/TDAP  04/19/2019 (Originally 02/21/1954)  . PNA vac Low Risk Adult  Completed        Assessment:   This is a routine wellness examination for Ryan [name].      Plan:    Goals    . DIET - INCREASE WATER INTAKE    . Exercise 3x per week (30 min per time)         Health Maintenance Recommendations: No recommendations at this time  Additional Screening Recommendations: Lung: Low Dose CT Chest recommended if Age 89-80 years, 30 pack-year currently smoking OR have quit w/in 15years. Patient does not qualify. Hepatitis C Screening recommended: no  Today's Orders No orders of the defined types were placed in this encounter.   Keep f/u with [name], FNP and any other specialty appointments you may have Continue current medications Move carefully to avoid falls. Use assistive devices like a cane or walker if needed. Aim for at least 150 minutes of moderate activity a week. This can be chair exercises if necessary. Reading or puzzles are a good way to exercise your brain Stay connected with friends and family. Social connections are beneficial to your emotional and mental health.   I have personally reviewed and noted the following in the patient's chart:   . Medical and social history . Use of alcohol, tobacco or illicit drugs  . Current medications and supplements . Functional ability and status . Nutritional status . Physical activity . Advanced directives . List of other physicians . Hospitalizations, surgeries, and ER visits in previous 12 months . Vitals . Screenings to include  cognitive, depression, and falls . Referrals and appointments  In addition, I have reviewed and discussed with patient certain preventive protocols, quality metrics, and best practice recommendations. A written personalized care plan for preventive services as well as general preventive health recommendations were provided to patient.       Demetrios Loll, RN   04/18/2018     I have reviewed and agree with the above AWV documentation.   Jannifer Rodney, FNP

## 2018-04-20 ENCOUNTER — Ambulatory Visit (INDEPENDENT_AMBULATORY_CARE_PROVIDER_SITE_OTHER): Payer: Medicare Other | Admitting: Family Medicine

## 2018-04-20 ENCOUNTER — Encounter: Payer: Self-pay | Admitting: Family Medicine

## 2018-04-20 VITALS — BP 136/68 | HR 63 | Temp 97.7°F | Ht 71.0 in | Wt 177.0 lb

## 2018-04-20 DIAGNOSIS — S90934A Unspecified superficial injury of right lesser toe(s), initial encounter: Secondary | ICD-10-CM | POA: Diagnosis not present

## 2018-04-20 DIAGNOSIS — T148XXA Other injury of unspecified body region, initial encounter: Principal | ICD-10-CM

## 2018-04-20 DIAGNOSIS — L03031 Cellulitis of right toe: Secondary | ICD-10-CM | POA: Diagnosis not present

## 2018-04-20 DIAGNOSIS — L089 Local infection of the skin and subcutaneous tissue, unspecified: Secondary | ICD-10-CM

## 2018-04-20 MED ORDER — CIPROFLOXACIN HCL 250 MG PO TABS: 250.0000 mg | ORAL_TABLET | Freq: Two times a day (BID) | ORAL | 0 refills | Status: AC

## 2018-04-20 NOTE — Patient Instructions (Signed)
Clean daily with warm soapy water and apply neosporin and gauze.

## 2018-04-23 ENCOUNTER — Encounter: Payer: Self-pay | Admitting: Family Medicine

## 2018-04-23 NOTE — Progress Notes (Signed)
Chief Complaint  Patient presents with  . Wound Infection    pt here today c/o 3rd toe on right foot hurts due to cutting his toe with clippers now it is red and swollen    HPI  Patient presents today for painful cut at right 3rd toe. Originally the cut occurred when pt was trimming his nails several days ago.   PMH: Smoking status noted ROS: Per HPI  Objective: BP 136/68   Pulse 63   Temp 97.7 F (36.5 C) (Oral)   Ht 5\' 11"  (1.803 m)   Wt 177 lb (80.3 kg)   BMI 24.69 kg/m  Gen: NAD, alert, cooperative with exam HEENT: NCAT, EOMI, PERRL CV: RRR, good S1/S2, no murmur Resp: CTABL, no wheezes, non-labored Ext: Mild edema, erythema on dorsal surface of toe. There is maceration and an open wound 4 mm.       Assessment and plan:  1. Wound infection, posttraumatic   2. Cellulitis of toe of right foot     Meds ordered this encounter  Medications  . ciprofloxacin (CIPRO) 250 MG tablet    Sig: Take 1 tablet (250 mg total) by mouth 2 (two) times daily for 10 days.    Dispense:  20 tablet    Refill:  0    No orders of the defined types were placed in this encounter.   Follow up as needed.  Mechele Claude, MD

## 2018-04-25 ENCOUNTER — Ambulatory Visit (INDEPENDENT_AMBULATORY_CARE_PROVIDER_SITE_OTHER): Payer: Medicare Other | Admitting: Family

## 2018-04-25 ENCOUNTER — Encounter: Payer: Self-pay | Admitting: Family

## 2018-04-25 VITALS — BP 125/67 | HR 62 | Temp 96.8°F | Ht 70.5 in | Wt 176.8 lb

## 2018-04-25 DIAGNOSIS — S90934D Unspecified superficial injury of right lesser toe(s), subsequent encounter: Secondary | ICD-10-CM | POA: Diagnosis not present

## 2018-04-25 DIAGNOSIS — L089 Local infection of the skin and subcutaneous tissue, unspecified: Secondary | ICD-10-CM | POA: Diagnosis not present

## 2018-04-25 DIAGNOSIS — T148XXA Other injury of unspecified body region, initial encounter: Secondary | ICD-10-CM

## 2018-04-25 DIAGNOSIS — L03031 Cellulitis of right toe: Secondary | ICD-10-CM | POA: Diagnosis not present

## 2018-04-25 NOTE — Patient Instructions (Signed)

## 2018-04-25 NOTE — Progress Notes (Signed)
   Subjective:    Patient ID: Ryan [name], male    DOB: [birth year], 83 y.o.   MRN: [id number]  Chief Complaint  Patient presents with  . recheck wound on toe    right foot    HPI PT presents to the office today to recheck third toe wound. Pt states he was cutting his toenail on 04/17/18 and accidentally cut part of the skin on his toe. He was seen on 04/20/18 and diagnosed with cellulitis of toe and started on cipro for 10 days.   Per the note his toe is improved and the redness was improved. He states intermittent aching pain of 10 out 10.     Review of Systems  Skin: Positive for wound.  All other systems reviewed and are negative.      Objective:   Physical Exam  Constitutional: He is oriented to person, place, and time. He appears well-developed and well-nourished. No distress.  HENT:  Head: Normocephalic.  Eyes: Pupils are equal, round, and reactive to light. Right eye exhibits no discharge. Left eye exhibits no discharge.  Neck: Normal range of motion. Neck supple. No thyromegaly present.  Cardiovascular: Normal rate, regular rhythm, normal heart sounds and intact distal pulses.  No murmur heard. Pulmonary/Chest: Effort normal and breath sounds normal. No respiratory distress. He has no wheezes.  Musculoskeletal: Normal range of motion. He exhibits no edema or tenderness.  Neurological: He is alert and oriented to person, place, and time. He has normal reflexes. No cranial nerve deficit.  Skin: Skin is warm and dry. No rash noted. No erythema.  Ulcer approx 0.8X0.8 cm on third right toe  Psychiatric: He has a normal mood and affect. His behavior is normal. Judgment and thought content normal.  Vitals reviewed.           BP 125/67   Pulse 62   Temp (!) 96.8 F (36 C) (Oral)   Ht 5' 10.5" (1.791 m)   Wt 176 lb 12.8 oz (80.2 kg)   BMI 25.01 kg/m      Assessment & Plan:  Ryan [name] comes in today with chief complaint of recheck wound on toe  (right foot)   Diagnosis and orders addressed:  1. Cellulitis of toe of right foot  2. Wound infection, posttraumatic  Continue with cipro and dressing changes RTO in 5 days after antibiotic is complete Report any increase in redness, pain, or discharge   Jannifer Rodney, FNP

## 2018-05-01 ENCOUNTER — Encounter: Payer: Self-pay | Admitting: Physician Assistant

## 2018-05-01 ENCOUNTER — Ambulatory Visit (INDEPENDENT_AMBULATORY_CARE_PROVIDER_SITE_OTHER): Payer: Medicare Other | Admitting: Physician Assistant

## 2018-05-01 VITALS — BP 126/65 | HR 59 | Temp 96.8°F | Ht 70.5 in | Wt 176.0 lb

## 2018-05-01 DIAGNOSIS — L03031 Cellulitis of right toe: Secondary | ICD-10-CM | POA: Diagnosis not present

## 2018-05-01 MED ORDER — CIPROFLOXACIN HCL 250 MG PO TABS: 250.0000 mg | ORAL_TABLET | Freq: Two times a day (BID) | ORAL | 0 refills | Status: AC

## 2018-05-01 NOTE — Progress Notes (Signed)
BP 126/65   Pulse (!) 59   Temp (!) 96.8 F (36 C) (Oral)   Ht 5' 10.5" (1.791 m)   Wt 176 lb (79.8 kg)   BMI 24.90 kg/m    Subjective:    Patient ID: Ryan [name], male    DOB: 19-Jun-1935, 83 y.o.   MRN: [id number]  HPI: Ryan [name] is a 83 y.o. male presenting on 05/01/2018 for Cellulitis (on toe right foot )  This patient comes in for 5-day recheck on the cellulitis of his right third toe.  He has had a traumatic injury due to trimming his nails.  In comparison to the previous pictures ulcer is much more shallow.  And it is drying well.  There is no redness around the ulcer at this time.  There is no drainage.  Also the redness of the third toe is reduced from 5 days ago.  He has still been taking his Cipro but is nearly out of it.  He can extend his course for 10 more days and have him follow-up with his PCP at that time.  Past Medical History:  Diagnosis Date  . Abnormal ECG   . Cardiomyopathy (HCC)    LVEF 45-50%  . Coronary atherosclerosis of native coronary artery    Based on Myoview - managing medically  . Dementia   . Essential hypertension, benign   . Type 2 diabetes mellitus (HCC)   . Vitamin B12 deficiency 05/01/2016   Relevant past medical, surgical, family and social history reviewed and updated as indicated. Interim medical history since our last visit reviewed. Allergies and medications reviewed and updated. DATA REVIEWED: CHART IN EPIC  Family History reviewed for pertinent findings.  Review of Systems  Constitutional: Negative.  Negative for appetite change and fatigue.  Eyes: Negative for pain and visual disturbance.  Respiratory: Negative.  Negative for cough, chest tightness, shortness of breath and wheezing.   Cardiovascular: Negative.  Negative for chest pain, palpitations and leg swelling.  Gastrointestinal: Negative.  Negative for abdominal pain, diarrhea, nausea and vomiting.  Genitourinary: Negative.   Skin: Positive for color  change and wound. Negative for rash.  Neurological: Negative.  Negative for weakness, numbness and headaches.  Psychiatric/Behavioral: Negative.     Allergies as of 05/01/2018      Reactions   Lisinopril Cough      Medication List        Accurate as of 05/01/18  9:43 AM. Always use your most recent med list.          albuterol 108 (90 Base) MCG/ACT inhaler Commonly known as:  PROVENTIL HFA;VENTOLIN HFA Inhale 1-2 puffs into the lungs every 6 (six) hours as needed for wheezing or shortness of breath.   aspirin EC 81 MG tablet Take 81 mg by mouth every other day.   ciprofloxacin 250 MG tablet Commonly known as:  CIPRO Take 1 tablet (250 mg total) by mouth 2 (two) times daily for 10 days.   escitalopram 5 MG tablet Commonly known as:  LEXAPRO Take 1 tablet (5 mg total) daily by mouth.   fluticasone 50 MCG/ACT nasal spray Commonly known as:  FLONASE Place 2 sprays into both nostrils daily.   fluticasone furoate-vilanterol 100-25 MCG/INH Aepb Commonly known as:  BREO ELLIPTA Inhale 1 puff into the lungs daily.   furosemide 20 MG tablet Commonly known as:  LASIX TAKE 1 TABLET BY MOUTH ONCE DAILY AT NOON   Garlic 1000 MG Caps Take 3 capsules  by mouth every morning.   Ginger 500 MG Caps Take 1 capsule by mouth daily.   hydrALAZINE 25 MG tablet Commonly known as:  APRESOLINE Take 1 tablet (25 mg total) by mouth 2 (two) times daily.   ibuprofen 200 MG tablet Commonly known as:  ADVIL,MOTRIN Take 400 mg by mouth every 6 (six) hours as needed for moderate pain.   isosorbide mononitrate 30 MG 24 hr tablet Commonly known as:  IMDUR Take 1 tablet (30 mg total) by mouth daily.   potassium chloride 10 MEQ tablet Commonly known as:  K-DUR Take 1 tablet (10 mEq total) by mouth daily.   vitamin B-12 1000 MCG tablet Commonly known as:  CYANOCOBALAMIN Take 0.5 tablets (500 mcg total) by mouth daily.   vitamin C 100 MG tablet Take 500 mg by mouth 2 (two) times daily.     Vitamin D (Ergocalciferol) 50000 units Caps capsule Commonly known as:  DRISDOL Take 1 capsule (50,000 Units total) by mouth every 7 (seven) days.   VITAMIN E/FOLIC ACID/B-6/B-12 PO Take 10 mg daily by mouth.          Objective:    BP 126/65   Pulse (!) 59   Temp (!) 96.8 F (36 C) (Oral)   Ht 5' 10.5" (1.791 m)   Wt 176 lb (79.8 kg)   BMI 24.90 kg/m   Allergies  Allergen Reactions  . Lisinopril Cough    Wt Readings from Last 3 Encounters:  05/01/18 176 lb (79.8 kg)  04/25/18 176 lb 12.8 oz (80.2 kg)  04/20/18 177 lb (80.3 kg)    Physical Exam  Constitutional: He appears well-developed and well-nourished. No distress.  HENT:  Head: Normocephalic and atraumatic.  Eyes: Pupils are equal, round, and reactive to light. Conjunctivae and EOM are normal.  Cardiovascular: Normal rate, regular rhythm and normal heart sounds.  Pulmonary/Chest: Effort normal and breath sounds normal. No respiratory distress.  Feet:  Right Foot:  Skin Integrity: Positive for ulcer and erythema. Negative for blister or skin breakdown.  Skin: Skin is warm and dry.  Psychiatric: He has a normal mood and affect. His behavior is normal.  Nursing note and vitals reviewed.       Assessment & Plan:   1. Cellulitis of toe of right foot - ciprofloxacin (CIPRO) 250 MG tablet; Take 1 tablet (250 mg total) by mouth 2 (two) times daily for 10 days.  Dispense: 20 tablet; Refill: 0   Continue all other maintenance medications as listed above.  Follow up plan: Return in about 10 days (around 05/11/2018) for reecheck with Christy.  Educational handout given for cellulitis  Remus Loffler PA-C [facility] [address]  [place], [place] [address] [phone]   05/01/2018, 9:43 AM

## 2018-05-01 NOTE — Patient Instructions (Signed)

## 2018-05-08 ENCOUNTER — Ambulatory Visit: Payer: Medicare Other | Admitting: Family

## 2018-05-11 ENCOUNTER — Ambulatory Visit: Payer: Medicare Other | Admitting: Family

## 2018-05-25 ENCOUNTER — Ambulatory Visit: Payer: Medicare Other | Admitting: Family

## 2018-05-25 ENCOUNTER — Encounter: Payer: Self-pay | Admitting: Family

## 2018-05-25 VITALS — BP 147/74 | HR 56 | Temp 96.0°F | Ht 70.5 in | Wt 176.6 lb

## 2018-05-25 DIAGNOSIS — E785 Hyperlipidemia, unspecified: Secondary | ICD-10-CM | POA: Diagnosis not present

## 2018-05-25 DIAGNOSIS — G309 Alzheimer's disease, unspecified: Secondary | ICD-10-CM | POA: Diagnosis not present

## 2018-05-25 DIAGNOSIS — E538 Deficiency of other specified B group vitamins: Secondary | ICD-10-CM | POA: Diagnosis not present

## 2018-05-25 DIAGNOSIS — J439 Emphysema, unspecified: Secondary | ICD-10-CM

## 2018-05-25 DIAGNOSIS — I429 Cardiomyopathy, unspecified: Secondary | ICD-10-CM | POA: Diagnosis not present

## 2018-05-25 DIAGNOSIS — I10 Essential (primary) hypertension: Secondary | ICD-10-CM

## 2018-05-25 DIAGNOSIS — I25119 Atherosclerotic heart disease of native coronary artery with unspecified angina pectoris: Secondary | ICD-10-CM

## 2018-05-25 DIAGNOSIS — Z23 Encounter for immunization: Secondary | ICD-10-CM

## 2018-05-25 DIAGNOSIS — Z9181 History of falling: Secondary | ICD-10-CM | POA: Diagnosis not present

## 2018-05-25 DIAGNOSIS — F0280 Dementia in other diseases classified elsewhere without behavioral disturbance: Secondary | ICD-10-CM

## 2018-05-25 MED ORDER — ATORVASTATIN CALCIUM 10 MG PO TABS: 10.0000 mg | ORAL_TABLET | Freq: Every day | ORAL | 1 refills | Status: AC

## 2018-05-25 NOTE — Addendum Note (Signed)
Addended by: Almeta Monas on: 05/25/2018 09:58 AM   Modules accepted: Orders

## 2018-05-25 NOTE — Progress Notes (Signed)
Subjective:    Patient ID: Ryan [name], male    DOB: Feb 05, 1935, 83 y.o.   MRN: [id number]  No chief complaint on file.  Pt presents to the office today for chronic follow up. PT is a poor historian and has dementia.Per EPIC pt has been seen by Cardiolgoists 01/30/18 on stable CAD, CHF, and cardiomyopathy with LVEF 45-50%.  Hypertension  This is a chronic problem. The current episode started more than 1 year ago. The problem has been waxing and waning since onset. The problem is controlled. Associated symptoms include headaches, malaise/fatigue and shortness of breath ("at times" ). Pertinent negatives include no peripheral edema. Risk factors for coronary artery disease include dyslipidemia, obesity, male gender and sedentary lifestyle. The current treatment provides mild improvement. Hypertensive end-organ damage includes CAD/MI and heart failure.  Congestive Heart Failure  Presents for follow-up visit. Associated symptoms include fatigue and shortness of breath ("at times" ). Pertinent negatives include no unexpected weight change. The symptoms have been stable.  Hyperlipidemia  This is a chronic problem. The current episode started more than 1 year ago. The problem is controlled. Recent lipid tests were reviewed and are normal. Associated symptoms include shortness of breath ("at times" ). Current antihyperlipidemic treatment includes diet change.  COPD PT using Breo daily. Stable   Review of Systems  Constitutional: Positive for fatigue and malaise/fatigue. Negative for unexpected weight change.  Respiratory: Positive for shortness of breath ("at times" ).   Neurological: Positive for headaches.  All other systems reviewed and are negative.      Objective:   Physical Exam  Constitutional: He is oriented to person, place, and time. He appears well-developed and well-nourished. No distress.  HENT:  Head: Normocephalic.  Right Ear: External ear normal.  Left Ear:  External ear normal.  Mouth/Throat: Oropharynx is clear and moist.  Eyes: Pupils are equal, round, and reactive to light. Right eye exhibits no discharge. Left eye exhibits no discharge.  Neck: Normal range of motion. Neck supple. No thyromegaly present.  Cardiovascular: Normal rate, regular rhythm, normal heart sounds and intact distal pulses.  No murmur heard. Pulmonary/Chest: Effort normal and breath sounds normal. No respiratory distress. He has no wheezes.  Abdominal: Soft. Bowel sounds are normal. He exhibits no distension. There is no tenderness.  Musculoskeletal: Normal range of motion. He exhibits no edema or tenderness.  Generalized weakness, using cane  Neurological: He is alert and oriented to person, place, and time. He has normal reflexes. No cranial nerve deficit.  Skin: Skin is warm and dry. No rash noted. No erythema.  Psychiatric: He has a normal mood and affect. His behavior is normal. Judgment and thought content normal.  Vitals reviewed.    BP (!) 147/74   Pulse (!) 56   Temp (!) 96 F (35.6 C) (Oral)   Ht 5' 10.5" (1.791 m)   Wt 176 lb 9.6 oz (80.1 kg)   BMI 24.98 kg/m      Assessment & Plan:  Ryan [name] comes in today with chief complaint of Medical Management of Chronic Issues   Diagnosis and orders addressed:  1. Alzheimer's dementia without behavioral disturbance, unspecified timing of dementia onset - CMP14+EGFR  2. At high risk for falls - CMP14+EGFR  3. Cardiomyopathy, unspecified type ([place]) - CMP14+EGFR  4. Vitamin B12 deficiency - CMP14+EGFR  5. Hyperlipidemia, unspecified hyperlipidemia type Will start Lipitor today - atorvastatin (LIPITOR) 10 MG tablet; Take 1 tablet (10 mg total) by mouth daily.  Dispense: 90  tablet; Refill: 1 - CMP14+EGFR  6. Essential hypertension, benign - CMP14+EGFR  7. Atherosclerosis of native coronary artery of native heart with angina pectoris (HCC) - CMP14+EGFR  8. Pulmonary emphysema,  unspecified emphysema type ([place])    Labs pending Health Maintenance reviewed- TDAP Diet and exercise encouraged  Follow up plan: 3 months   Evelina Dun, FNP

## 2018-05-25 NOTE — Patient Instructions (Signed)

## 2018-06-07 ENCOUNTER — Other Ambulatory Visit: Payer: Self-pay | Admitting: Cardiology

## 2018-06-14 ENCOUNTER — Telehealth: Payer: Self-pay | Admitting: *Deleted

## 2018-06-14 NOTE — Telephone Encounter (Signed)
Patient complains of sore throat, fever and a cough that causes chest pains.  Advised to take tylenol, cool bath ,monitor fevers.  Last temperature was about 100 and he feels too weak to go out for health care.  Advised to call EMS if fever spikes  and no improvement this evening.  He thinks he will be able to drive to [facility] tomorrow morning. Advised we will call to check on him tomorrow if he does not visit for an appointment.

## 2018-06-15 ENCOUNTER — Ambulatory Visit (INDEPENDENT_AMBULATORY_CARE_PROVIDER_SITE_OTHER): Payer: Medicare Other | Admitting: Family

## 2018-06-15 ENCOUNTER — Encounter: Payer: Self-pay | Admitting: Family

## 2018-06-15 VITALS — BP 122/65 | HR 64 | Temp 98.2°F | Ht 70.5 in | Wt 178.0 lb

## 2018-06-15 DIAGNOSIS — J189 Pneumonia, unspecified organism: Secondary | ICD-10-CM | POA: Diagnosis not present

## 2018-06-15 MED ORDER — PREDNISONE 10 MG (21) PO TBPK: ORAL_TABLET | ORAL | 0 refills | Status: DC

## 2018-06-15 MED ORDER — AZITHROMYCIN 250 MG PO TABS: ORAL_TABLET | ORAL | 0 refills | Status: DC

## 2018-06-15 NOTE — Patient Instructions (Signed)

## 2018-06-15 NOTE — Progress Notes (Signed)
   Subjective:    Patient ID: Ryan [name], male    DOB: [birth year], 83 y.o.   MRN: [id number]  Chief Complaint  Patient presents with  . Cough causes chest pain  . Fever    Cough  This is a new problem. The current episode started in the past 7 days. The problem has been waxing and waning. The problem occurs every few minutes. The cough is productive of sputum. Associated symptoms include chills, ear congestion, ear pain, a fever, headaches, myalgias, rhinorrhea, a sore throat and shortness of breath. Pertinent negatives include no nasal congestion or postnasal drip. The symptoms are aggravated by lying down. He has tried rest for the symptoms. The treatment provided mild relief. His past medical history is significant for COPD.      Review of Systems  Constitutional: Positive for chills and fever.  HENT: Positive for ear pain, rhinorrhea and sore throat. Negative for postnasal drip.   Respiratory: Positive for cough and shortness of breath.   Musculoskeletal: Positive for myalgias.  Neurological: Positive for headaches.  All other systems reviewed and are negative.      Objective:   Physical Exam  Constitutional: He is oriented to person, place, and time. He appears well-developed and well-nourished. No distress.  HENT:  Head: Normocephalic.  Right Ear: External ear normal.  Left Ear: External ear normal.  Nose: Rhinorrhea present.  Mouth/Throat: Posterior oropharyngeal erythema present.  Eyes: Pupils are equal, round, and reactive to light. Right eye exhibits no discharge. Left eye exhibits no discharge.  Neck: Normal range of motion. Neck supple. No thyromegaly present.  Cardiovascular: Normal rate, regular rhythm, normal heart sounds and intact distal pulses.  No murmur heard. Pulmonary/Chest: Effort normal. No respiratory distress. He has wheezes. He has rhonchi.  Abdominal: Soft. Bowel sounds are normal. He exhibits no distension. There is no tenderness.    Musculoskeletal: Normal range of motion. He exhibits no edema or tenderness.  Neurological: He is alert and oriented to person, place, and time. He has normal reflexes. No cranial nerve deficit.  Skin: Skin is warm and dry. No rash noted. No erythema.  Psychiatric: He has a normal mood and affect. His behavior is normal. Judgment and thought content normal.  Vitals reviewed.     BP 122/65   Pulse 64   Temp 98.2 F (36.8 C) (Oral)   Ht 5' 10.5" (1.791 m)   Wt 178 lb (80.7 kg)   BMI 25.18 kg/m      Assessment & Plan:  Ryan [name] comes in today with chief complaint of Cough causes chest pain and Fever   Diagnosis and orders addressed:  1. Community acquired pneumonia, unspecified laterality - Take meds as prescribed - Use a cool mist humidifier  -Use saline nose sprays frequently -Force fluids -For any cough or congestion  Use plain Mucinex- regular strength or max strength is fine -For fever or aces or pains- take tylenol or ibuprofen. -Throat lozenges if help -RTO 1 week or if symptoms worsen or do not improve - azithromycin (ZITHROMAX) 250 MG tablet; Take 500 mg once, then 250 mg for four days  Dispense: 6 tablet; Refill: 0 - predniSONE (STERAPRED UNI-PAK 21 TAB) 10 MG (21) TBPK tablet; Use as directed  Dispense: 21 tablet; Refill: 0   Jannifer Rodney, FNP

## 2018-06-22 ENCOUNTER — Ambulatory Visit (INDEPENDENT_AMBULATORY_CARE_PROVIDER_SITE_OTHER): Payer: Medicare Other | Admitting: Family

## 2018-06-22 ENCOUNTER — Other Ambulatory Visit: Payer: Self-pay | Admitting: Family

## 2018-06-22 ENCOUNTER — Ambulatory Visit (INDEPENDENT_AMBULATORY_CARE_PROVIDER_SITE_OTHER): Payer: Medicare Other

## 2018-06-22 ENCOUNTER — Encounter: Payer: Self-pay | Admitting: Family

## 2018-06-22 VITALS — BP 139/69 | HR 59 | Temp 96.9°F | Ht 70.5 in | Wt 177.8 lb

## 2018-06-22 DIAGNOSIS — R05 Cough: Secondary | ICD-10-CM | POA: Diagnosis not present

## 2018-06-22 DIAGNOSIS — I5031 Acute diastolic (congestive) heart failure: Secondary | ICD-10-CM

## 2018-06-22 DIAGNOSIS — R059 Cough, unspecified: Secondary | ICD-10-CM

## 2018-06-22 DIAGNOSIS — J441 Chronic obstructive pulmonary disease with (acute) exacerbation: Secondary | ICD-10-CM

## 2018-06-22 DIAGNOSIS — R0602 Shortness of breath: Secondary | ICD-10-CM | POA: Diagnosis not present

## 2018-06-22 DIAGNOSIS — I10 Essential (primary) hypertension: Secondary | ICD-10-CM

## 2018-06-22 MED ORDER — DOXYCYCLINE HYCLATE 100 MG PO TABS: 100.0000 mg | ORAL_TABLET | Freq: Two times a day (BID) | ORAL | 0 refills | Status: DC

## 2018-06-22 MED ORDER — PREDNISONE 10 MG (21) PO TBPK: ORAL_TABLET | ORAL | 0 refills | Status: DC

## 2018-06-22 NOTE — Progress Notes (Signed)
Subjective:    Patient ID: Ryan [name], male    DOB: 1935-06-10, 83 y.o.   MRN: [id number]  Chief Complaint  Patient presents with  . cough and congestion    Cough  This is a recurrent problem. The current episode started 1 to 4 weeks ago. The problem has been waxing and waning. The problem occurs every few minutes. The cough is productive of sputum. Associated symptoms include chills, ear congestion, headaches, nasal congestion, postnasal drip, rhinorrhea, a sore throat, shortness of breath and wheezing. Pertinent negatives include no ear pain, fever or myalgias. The symptoms are aggravated by lying down. Treatments tried: Zpak and prednsione, but states he did not take all of them because he dropped thim. The treatment provided mild relief. His past medical history is significant for COPD.      Review of Systems  Constitutional: Positive for chills. Negative for fever.  HENT: Positive for postnasal drip, rhinorrhea and sore throat. Negative for ear pain.   Respiratory: Positive for cough, shortness of breath and wheezing.   Musculoskeletal: Negative for myalgias.  Neurological: Positive for headaches.  All other systems reviewed and are negative.      Objective:   Physical Exam  Constitutional: He is oriented to person, place, and time. He appears well-developed and well-nourished. No distress.  HENT:  Head: Normocephalic.  Right Ear: External ear normal.  Left Ear: External ear normal.  Mouth/Throat: Oropharynx is clear and moist.  Eyes: Pupils are equal, round, and reactive to light. Right eye exhibits no discharge. Left eye exhibits no discharge.  Neck: Normal range of motion. Neck supple. No thyromegaly present.  Cardiovascular: Normal rate, regular rhythm, normal heart sounds and intact distal pulses.  No murmur heard. Pulmonary/Chest: Effort normal. No respiratory distress. He has wheezes.  Constant nonproductive cough  Abdominal: Soft. Bowel sounds are  normal. He exhibits no distension. There is no tenderness.  Musculoskeletal: Normal range of motion. He exhibits no edema or tenderness.  Neurological: He is alert and oriented to person, place, and time. He has normal reflexes. No cranial nerve deficit.  Skin: Skin is warm and dry. No rash noted. No erythema.  Psychiatric: He has a normal mood and affect. His behavior is normal. Judgment and thought content normal.  Vitals reviewed.     BP 139/69   Pulse (!) 59   Temp (!) 96.9 F (36.1 C) (Oral)   Ht 5' 10.5" (1.791 m)   Wt 177 lb 12.8 oz (80.6 kg)   BMI 25.15 kg/m      Assessment & Plan:  NAHSIR VENEZIA comes in today with chief complaint of cough and congestion   Diagnosis and orders addressed:  1. Cough - DG Chest 2 View; Future - predniSONE (STERAPRED UNI-PAK 21 TAB) 10 MG (21) TBPK tablet; Use as directed  Dispense: 21 tablet; Refill: 0 - doxycycline (VIBRA-TABS) 100 MG tablet; Take 1 tablet (100 mg total) by mouth 2 (two) times daily.  Dispense: 20 tablet; Refill: 0  2. COPD exacerbation (HCC) - Take meds as prescribed - Use a cool mist humidifier  -Use saline nose sprays frequently -Force fluids -For any cough or congestion  Use plain Mucinex- regular strength or max strength is fine -For fever or aces or pains- take tylenol or ibuprofen. -Throat lozenges if help -RTO in 2 weeks or sooner if symptoms do not improve or worsen - predniSONE (STERAPRED UNI-PAK 21 TAB) 10 MG (21) TBPK tablet; Use as directed  Dispense: 21 tablet; Refill:  0 - doxycycline (VIBRA-TABS) 100 MG tablet; Take 1 tablet (100 mg total) by mouth 2 (two) times daily.  Dispense: 20 tablet; Refill: 0   Jannifer Rodney, FNP

## 2018-06-22 NOTE — Patient Instructions (Signed)

## 2018-06-30 ENCOUNTER — Ambulatory Visit: Payer: Medicare Other | Admitting: Family Medicine

## 2018-06-30 ENCOUNTER — Telehealth: Payer: Self-pay | Admitting: *Deleted

## 2018-06-30 NOTE — Telephone Encounter (Signed)
Pt came into office stating his "lungs are burning" and has cough Pt's vitals were checked bp 134 76  p 70 spO2 99  R 18  Pt also stated he is having double vision Pt states this has been going on for years Per Jannifer Rodney, pt instructed to be evaluated in the ER for vision changes Pt declined Pt also offered appt with on call provider in clinic Pt declined this appt also Pt wanted to schedule appt with Jannifer Rodney appt scheduled for pt on Monday Pt encouraged to go to ED if sxs worsen Pt verbalizes understanding

## 2018-07-03 ENCOUNTER — Ambulatory Visit (INDEPENDENT_AMBULATORY_CARE_PROVIDER_SITE_OTHER): Payer: Medicare Other | Admitting: Family

## 2018-07-03 ENCOUNTER — Encounter: Payer: Self-pay | Admitting: Family

## 2018-07-03 ENCOUNTER — Ambulatory Visit (INDEPENDENT_AMBULATORY_CARE_PROVIDER_SITE_OTHER): Payer: Medicare Other

## 2018-07-03 VITALS — BP 152/74 | HR 60 | Temp 97.7°F | Ht 70.5 in | Wt 177.0 lb

## 2018-07-03 DIAGNOSIS — R0602 Shortness of breath: Secondary | ICD-10-CM | POA: Diagnosis not present

## 2018-07-03 DIAGNOSIS — R05 Cough: Secondary | ICD-10-CM | POA: Diagnosis not present

## 2018-07-03 DIAGNOSIS — J069 Acute upper respiratory infection, unspecified: Secondary | ICD-10-CM | POA: Diagnosis not present

## 2018-07-03 DIAGNOSIS — R059 Cough, unspecified: Secondary | ICD-10-CM

## 2018-07-03 DIAGNOSIS — J439 Emphysema, unspecified: Secondary | ICD-10-CM

## 2018-07-03 DIAGNOSIS — J029 Acute pharyngitis, unspecified: Secondary | ICD-10-CM

## 2018-07-03 DIAGNOSIS — G309 Alzheimer's disease, unspecified: Secondary | ICD-10-CM

## 2018-07-03 DIAGNOSIS — F0280 Dementia in other diseases classified elsewhere without behavioral disturbance: Secondary | ICD-10-CM

## 2018-07-03 LAB — CULTURE, GROUP A STREP

## 2018-07-03 LAB — RAPID STREP SCREEN (MED CTR MEBANE ONLY): Strep Gp A Ag, IA W/Reflex: NEGATIVE

## 2018-07-03 MED ORDER — FLUTICASONE PROPIONATE 50 MCG/ACT NA SUSP: 2.0000 | Freq: Every day | NASAL | 6 refills | Status: AC

## 2018-07-03 MED ORDER — CETIRIZINE HCL 10 MG PO TABS: 10.0000 mg | ORAL_TABLET | Freq: Every day | ORAL | 11 refills | Status: AC

## 2018-07-03 NOTE — Patient Instructions (Signed)
Upper Respiratory Infection, Adult Most upper respiratory infections (URIs) are caused by a virus. A URI affects the nose, throat, and upper air passages. The most common type of URI is often called "the common cold." Follow these instructions at home:  Take medicines only as told by your doctor.  Gargle warm saltwater or take cough drops to comfort your throat as told by your doctor.  Use a warm mist humidifier or inhale steam from a shower to increase air moisture. This may make it easier to breathe.  Drink enough fluid to keep your pee (urine) clear or pale yellow.  Eat soups and other clear broths.  Have a healthy diet.  Rest as needed.  Go back to work when your fever is gone or your doctor says it is okay. ? You may need to stay home longer to avoid giving your URI to others. ? You can also wear a face mask and wash your hands often to prevent spread of the virus.  Use your inhaler more if you have asthma.  Do not use any tobacco products, including cigarettes, chewing tobacco, or electronic cigarettes. If you need help quitting, ask your doctor. Contact a doctor if:  You are getting worse, not better.  Your symptoms are not helped by medicine.  You have chills.  You are getting more short of breath.  You have brown or red mucus.  You have yellow or brown discharge from your nose.  You have pain in your face, especially when you bend forward.  You have a fever.  You have puffy (swollen) neck glands.  You have pain while swallowing.  You have white areas in the back of your throat. Get help right away if:  You have very bad or constant: ? Headache. ? Ear pain. ? Pain in your forehead, behind your eyes, and over your cheekbones (sinus pain). ? Chest pain.  You have long-lasting (chronic) lung disease and any of the following: ? Wheezing. ? Long-lasting cough. ? Coughing up blood. ? A change in your usual mucus.  You have a stiff neck.  You have  changes in your: ? Vision. ? Hearing. ? Thinking. ? Mood. This information is not intended to replace advice given to you by your health care provider. Make sure you discuss any questions you have with your health care provider. Document Released: 02/16/2008 Document Revised: 05/02/2016 Document Reviewed: 12/05/2013 Elsevier Interactive Patient Education  2018 Elsevier Inc.  

## 2018-07-03 NOTE — Progress Notes (Signed)
Subjective:    Patient ID: Ryan [name], male    DOB: 08/16/1935, 83 y.o.   MRN: [id number]  Chief Complaint  Patient presents with  . cough and congestion  . Fever   Pt presents to the office today for recurrent cough. He was seen on 06/15/18 and started on Zpak and prednisone. He was then saw on 06/22/18 with "worsening symptoms" and given doxycyline 100 mg BID. Pt has dementia and is a poor historian. He states he can't remember if he took the doxycycline or not.   He had a negative chest x-ray.  Cough  This is a recurrent problem. The current episode started more than 1 month ago. The problem has been waxing and waning. The problem occurs every few minutes. The cough is productive of sputum. Associated symptoms include chills, headaches, myalgias, nasal congestion, postnasal drip, rhinorrhea, a sore throat and shortness of breath. Pertinent negatives include no ear congestion, ear pain or fever. The symptoms are aggravated by lying down. Treatments tried: flonase. The treatment provided mild relief. There is no history of COPD.  Sore Throat   Associated symptoms include coughing, headaches and shortness of breath. Pertinent negatives include no ear pain.      Review of Systems  Constitutional: Positive for chills. Negative for fever.  HENT: Positive for postnasal drip, rhinorrhea and sore throat. Negative for ear pain.   Respiratory: Positive for cough and shortness of breath.   Musculoskeletal: Positive for myalgias.  Neurological: Positive for headaches.  All other systems reviewed and are negative.      Objective:   Physical Exam  Constitutional: He is oriented to person, place, and time. He appears well-developed and well-nourished. No distress.  HENT:  Head: Normocephalic.  Right Ear: External ear normal.  Left Ear: External ear normal.  Nose: Mucosal edema and rhinorrhea present.  Mouth/Throat: Posterior oropharyngeal erythema present.  Eyes: Pupils are  equal, round, and reactive to light. Right eye exhibits no discharge. Left eye exhibits no discharge.  Neck: Normal range of motion. Neck supple. No thyromegaly present.  Cardiovascular: Normal rate, regular rhythm, normal heart sounds and intact distal pulses.  No murmur heard. Pulmonary/Chest: Effort normal and breath sounds normal. No respiratory distress. He has no wheezes.  Intermittent dry cough  Abdominal: Soft. Bowel sounds are normal. He exhibits no distension. There is no tenderness.  Musculoskeletal: Normal range of motion. He exhibits no edema or tenderness.  Lymphadenopathy:    He has cervical adenopathy.  Neurological: He is alert and oriented to person, place, and time. He has normal reflexes. No cranial nerve deficit.  Skin: Skin is warm and dry. No rash noted. No erythema.  Psychiatric: He has a normal mood and affect. His behavior is normal. Judgment and thought content normal.  Vitals reviewed.  Chest x-ray- Negative, Preliminary reading by Jannifer Rodney, FNP [facility]   BP (!) 152/74   Pulse 60   Temp 97.7 F (36.5 C) (Oral)   Ht 5' 10.5" (1.791 m)   Wt 177 lb (80.3 kg)   SpO2 99%   BMI 25.04 kg/m      Assessment & Plan:  Ryan [name] comes in today with chief complaint of cough and congestion and Fever   Diagnosis and orders addressed:  1. Cough - DG Chest 2 View; Future  2. Pulmonary emphysema, unspecified emphysema type (HCC)  3. Sore throat Strep negative - Rapid Strep Screen ([facility] ONLY)  4. Viral upper respiratory tract infection - Take  meds as prescribed - Use a cool mist humidifier  -Use saline nose sprays frequently -Force fluids -For any cough or congestion  Use plain Mucinex- regular strength or max strength is fine -For fever or aces or pains- take tylenol or ibuprofen. -Throat lozenges if help -New toothbrush in 3 days Pt has been on multiple antibiotics over the last few weeks. I want him to try taking Zyrtec and  flonase every day. I feel like a lot of this pain in his throat is from drainage  - cetirizine (ZYRTEC) 10 MG tablet; Take 1 tablet (10 mg total) by mouth daily.  Dispense: 30 tablet; Refill: 11 - fluticasone (FLONASE) 50 MCG/ACT nasal spray; Place 2 sprays into both nostrils daily.  Dispense: 16 g; Refill: 6  5. Alzheimer's dementia without behavioral disturbance, unspecified timing of dementia onset (HCC)   Greater than 30 mins spent with patient discussing medications and symptom management. We discussed red flags and precautions that he would need to come back. Pt states he understands. He is tearful at times. He is lonely.   Jannifer Rodney, FNP

## 2018-07-07 ENCOUNTER — Other Ambulatory Visit: Payer: Self-pay | Admitting: Family

## 2018-07-07 DIAGNOSIS — F329 Major depressive disorder, single episode, unspecified: Secondary | ICD-10-CM

## 2018-07-07 DIAGNOSIS — F32A Depression, unspecified: Secondary | ICD-10-CM

## 2018-08-03 NOTE — Progress Notes (Signed)
Cardiology Office Note  Date: 08/04/2018   ID: Ryan, [name] [birth year], MRN [id number]  PCP: [name], FNP  Primary Cardiologist: Nona Dell, MD   Chief Complaint  Patient presents with  . Cardiac follow-up    History of Present Illness: Ryan [name] is an 83 y.o. male last seen in May.  He presents for a routine visit.  Reports chronic shortness of breath, NYHA class II-III, overall stable however.  He does not describe angina, palpitations, or syncope.  I reviewed his medications.  Current cardiac regimen includes aspirin, Lipitor, Lasix, potassium supplements, hydralazine, and Imdur.  We have not started beta-blocker with relatively low resting heart rate.  He has an allergy to lisinopril.  Weight has been stable.  He does not report orthopnea or PND.  Fairly sedentary overall, uses a cane to get around in his house.  Reports upper respiratory infection treated by PCP.  No current cough, fevers or chills.   Past Medical History:  Diagnosis Date  . Abnormal ECG   . Cardiomyopathy (HCC)    LVEF 45-50%  . Coronary atherosclerosis of native coronary artery    Based on Myoview - managing medically  . Dementia (HCC)   . Essential hypertension, benign   . Type 2 diabetes mellitus (HCC)   . Vitamin B12 deficiency 05/01/2016    Past Surgical History:  Procedure Laterality Date  . EYE SURGERY Bilateral 08/2016   Dr. Sharl Ma, [place]  . HERNIA REPAIR    . HIP SURGERY    . KNEE SURGERY    . PROSTATE SURGERY      Current Outpatient Medications  Medication Sig Dispense Refill  . albuterol (PROVENTIL HFA;VENTOLIN HFA) 108 (90 Base) MCG/ACT inhaler Inhale 1-2 puffs into the lungs every 6 (six) hours as needed for wheezing or shortness of breath. 1 Inhaler 0  . Ascorbic Acid (VITAMIN C) 100 MG tablet Take 500 mg by mouth 2 (two) times daily.     Marland Kitchen aspirin EC 81 MG tablet Take 81 mg by mouth every other day.     Marland Kitchen atorvastatin (LIPITOR) 10 MG  tablet Take 1 tablet (10 mg total) by mouth daily. 90 tablet 1  . cetirizine (ZYRTEC) 10 MG tablet Take 1 tablet (10 mg total) by mouth daily. 30 tablet 11  . escitalopram (LEXAPRO) 5 MG tablet TAKE 1 TABLET BY MOUTH ONCE DAILY 90 tablet 0  . fluticasone (FLONASE) 50 MCG/ACT nasal spray Place 2 sprays into both nostrils daily. 16 g 6  . fluticasone furoate-vilanterol (BREO ELLIPTA) 100-25 MCG/INH AEPB Inhale 1 puff into the lungs daily. 60 each 3  . furosemide (LASIX) 20 MG tablet TAKE 1 TABLET BY MOUTH ONCE DAILY AT NOON 90 tablet 1  . Garlic 1000 MG CAPS Take 3 capsules by mouth every morning.     . Ginger 500 MG CAPS Take 1 capsule by mouth daily.     . hydrALAZINE (APRESOLINE) 25 MG tablet TAKE 1 TABLET BY MOUTH TWICE DAILY 60 tablet 2  . ibuprofen (ADVIL,MOTRIN) 200 MG tablet Take 400 mg by mouth every 6 (six) hours as needed for moderate pain.    . isosorbide mononitrate (IMDUR) 30 MG 24 hr tablet TAKE 1 TABLET BY MOUTH ONCE DAILY 90 tablet 2  . potassium chloride (K-DUR) 10 MEQ tablet Take 1 tablet (10 mEq total) by mouth daily. 90 tablet 3  . Vitamin D, Ergocalciferol, (DRISDOL) 50000 units CAPS capsule Take 1 capsule (50,000 Units total) by mouth every  7 (seven) days. 12 capsule 4   No current facility-administered medications for this visit.    Allergies:  Lisinopril   Social History: The patient  reports that he quit smoking about 51 years ago. His smoking use included cigarettes. He started smoking about 67 years ago. He has a 15.00 pack-year smoking history. He has never used smokeless tobacco. He reports that he does not drink alcohol or use drugs.   ROS:  Please see the history of present illness. Otherwise, complete review of systems is positive for hearing loss.  All other systems are reviewed and negative.   Physical Exam: VS:  BP 132/70   Pulse 65   Ht 5' 10.5" (1.791 m)   Wt 173 lb 12.8 oz (78.8 kg)   SpO2 98%   BMI 24.59 kg/m , BMI Body mass index is 24.59  kg/m.  Wt Readings from Last 3 Encounters:  08/04/18 173 lb 12.8 oz (78.8 kg)  07/03/18 177 lb (80.3 kg)  06/22/18 177 lb 12.8 oz (80.6 kg)    General: Elderly male, appears comfortable at rest. HEENT: Conjunctiva and lids normal, oropharynx clear. Neck: Supple, no elevated JVP or carotid bruits, no thyromegaly. Lungs: Clear to auscultation, nonlabored breathing at rest. Cardiac: Regular rate and rhythm, no S3, soft systolic murmur. Abdomen: Soft, nontender, bowel sounds present. Extremities: Stable, trace edema, distal pulses 2+.  ECG: I personally reviewed the tracing from 01/05/2018 which showed sinus rhythm with nonspecific ST-T changes and possible old inferolateral infarct pattern.  Recent Labwork: 02/21/2018: ALT 10; AST 18; BUN 24; Creatinine, Ser 1.11; Hemoglobin 13.2; Platelets 150; Potassium 4.4; Sodium 144; TSH 1.950     Component Value Date/Time   CHOL 142 02/21/2018 1220   TRIG 138 02/21/2018 1220   TRIG 210 (H) 06/24/2014 1424   HDL 51 02/21/2018 1220   HDL 45 06/24/2014 1424   CHOLHDL 2.8 02/21/2018 1220   CHOLHDL 3.0 07/08/2012 0551   VLDL 22 07/08/2012 0551   LDLCALC 63 02/21/2018 1220   LDLCALC 72 06/24/2014 1424    Other Studies Reviewed Today:  Echocardiogram 05/02/2015: Study Conclusions  - Left ventricle: Technically difficult study. There is hypokinesis  of base inferior segment. Inferolateral wall difficult to assess.  There is hypokinesis of this wall. The EF is 45-50% The cavity  size was normal. Wall thickness was increased in a pattern of  mild LVH. - Mitral valve: Calcified annulus. There was no significant  regurgitation. - Right ventricle: The cavity size was normal. Systolic function  was normal.  Assessment and Plan:  1.  Ischemic heart disease with mildly reduced LVEF in the range of 45 to 50%.  This is based on noninvasive imaging and we have managed him conservatively.  He has chronic dyspnea on exertion but no active angina,  weight is stable, does not appear volume overloaded on examination.  Plan to continue current medical regimen as detailed above.  2.  Essential hypertension, blood pressure is adequately controlled today.  No changes made to present regimen.  Current medicines were reviewed with the patient today.  Disposition: Follow-up in 6 months.  Signed, Jonelle Sidle, MD, [facility] 08/04/2018 11:50 AM    [facility] [address] [place], [place], [place] [address] Phone: [phone]; Fax: [phone]

## 2018-08-04 ENCOUNTER — Encounter: Payer: Self-pay | Admitting: Cardiology

## 2018-08-04 ENCOUNTER — Ambulatory Visit (INDEPENDENT_AMBULATORY_CARE_PROVIDER_SITE_OTHER): Payer: Medicare Other | Admitting: Cardiology

## 2018-08-04 VITALS — BP 132/70 | HR 65 | Ht 70.5 in | Wt 173.8 lb

## 2018-08-04 DIAGNOSIS — I25119 Atherosclerotic heart disease of native coronary artery with unspecified angina pectoris: Secondary | ICD-10-CM

## 2018-08-04 DIAGNOSIS — I10 Essential (primary) hypertension: Secondary | ICD-10-CM | POA: Diagnosis not present

## 2018-08-04 NOTE — Patient Instructions (Addendum)

## 2018-08-07 DIAGNOSIS — H5032 Intermittent alternating esotropia: Secondary | ICD-10-CM | POA: Diagnosis not present

## 2018-08-07 DIAGNOSIS — H52223 Regular astigmatism, bilateral: Secondary | ICD-10-CM | POA: Diagnosis not present

## 2018-08-11 ENCOUNTER — Other Ambulatory Visit: Payer: Self-pay | Admitting: Cardiology

## 2018-08-22 ENCOUNTER — Encounter: Payer: Self-pay | Admitting: Family

## 2018-08-22 ENCOUNTER — Ambulatory Visit (INDEPENDENT_AMBULATORY_CARE_PROVIDER_SITE_OTHER): Payer: Medicare Other | Admitting: Family

## 2018-08-22 VITALS — BP 119/66 | HR 58 | Temp 97.2°F | Ht 70.5 in | Wt 174.0 lb

## 2018-08-22 DIAGNOSIS — I10 Essential (primary) hypertension: Secondary | ICD-10-CM | POA: Diagnosis not present

## 2018-08-22 DIAGNOSIS — I25119 Atherosclerotic heart disease of native coronary artery with unspecified angina pectoris: Secondary | ICD-10-CM

## 2018-08-22 DIAGNOSIS — G309 Alzheimer's disease, unspecified: Secondary | ICD-10-CM

## 2018-08-22 DIAGNOSIS — F0280 Dementia in other diseases classified elsewhere without behavioral disturbance: Secondary | ICD-10-CM

## 2018-08-22 DIAGNOSIS — Z9181 History of falling: Secondary | ICD-10-CM

## 2018-08-22 DIAGNOSIS — I5031 Acute diastolic (congestive) heart failure: Secondary | ICD-10-CM

## 2018-08-22 DIAGNOSIS — I429 Cardiomyopathy, unspecified: Secondary | ICD-10-CM

## 2018-08-22 DIAGNOSIS — E785 Hyperlipidemia, unspecified: Secondary | ICD-10-CM

## 2018-08-22 DIAGNOSIS — J439 Emphysema, unspecified: Secondary | ICD-10-CM

## 2018-08-22 DIAGNOSIS — F320 Major depressive disorder, single episode, mild: Secondary | ICD-10-CM

## 2018-08-22 NOTE — Progress Notes (Signed)
Subjective:    Patient ID: Ryan [name], male    DOB: 09-03-1935, 83 y.o.   MRN: [id number]  Chief Complaint  Patient presents with  . Medical Management of Chronic Issues    three month recheck   Pt presents to the office todayfor chronic follow up. PT is a poor historian and has dementia.Per [facility] pt has been seen by Cardiolgoists 08/04/18 on stable CAD, CHF, and cardiomyopathy with LVEF 45-50%. Hypertension  This is a chronic problem. The current episode started more than 1 year ago. The problem has been resolved since onset. The problem is controlled. Associated symptoms include malaise/fatigue. Pertinent negatives include no headaches, peripheral edema or shortness of breath. Risk factors for coronary artery disease include dyslipidemia, obesity, male gender and sedentary lifestyle. The current treatment provides moderate improvement. Hypertensive end-organ damage includes CAD/MI and heart failure.  Congestive Heart Failure  Presents for follow-up visit. Associated symptoms include fatigue. Pertinent negatives include no edema, shortness of breath or unexpected weight change. The symptoms have been stable.  Depression         This is a chronic problem.  The current episode started more than 1 year ago.   The onset quality is gradual.   The problem occurs intermittently.  The problem has been waxing and waning since onset.  Associated symptoms include fatigue and sad.  Associated symptoms include no helplessness, no hopelessness, not irritable and no headaches.  Past treatments include SSRIs - Selective serotonin reuptake inhibitors. COPD Uses Breo inhaler daily. Does not smoke, quit 1968.    Review of Systems  Constitutional: Positive for fatigue and malaise/fatigue. Negative for unexpected weight change.  Respiratory: Negative for shortness of breath.   Neurological: Negative for headaches.  Psychiatric/Behavioral: Positive for depression.  All other systems reviewed and are  negative.      Objective:   Physical Exam  Constitutional: He is oriented to person, place, and time. He appears well-developed and well-nourished. He is not irritable. No distress.  HENT:  Head: Normocephalic.  Right Ear: External ear normal.  Left Ear: External ear normal.  Mouth/Throat: Oropharynx is clear and moist.  Eyes: Pupils are equal, round, and reactive to light. Right eye exhibits no discharge. Left eye exhibits no discharge.  Neck: Normal range of motion. Neck supple. No thyromegaly present.  Cardiovascular: Normal rate, regular rhythm, normal heart sounds and intact distal pulses.  No murmur heard. Pulmonary/Chest: Effort normal and breath sounds normal. No respiratory distress. He has no wheezes.  Intermittent nonproductive cough  Abdominal: Soft. Bowel sounds are normal. He exhibits no distension. There is no tenderness.  Musculoskeletal: He exhibits no edema (trace in left ankle) or tenderness.  Unsteady gait, using cane  Neurological: He is alert and oriented to person, place, and time. He has normal reflexes. No cranial nerve deficit.  Skin: Skin is warm and dry. No rash noted. No erythema.  Psychiatric: He has a normal mood and affect. His behavior is normal. Judgment and thought content normal.  Vitals reviewed.     BP 119/66   Pulse (!) 58   Temp (!) 97.2 F (36.2 C) (Oral)   Ht 5' 10.5" (1.791 m)   Wt 174 lb (78.9 kg)   BMI 24.61 kg/m      Assessment & Plan:  Ryan [name] comes in today with chief complaint of Medical Management of Chronic Issues (three month recheck)   Diagnosis and orders addressed:  1. Essential hypertension, benign - CMP14+EGFR - CBC with Differential/Platelet  2. Acute diastolic heart failure, NYHA class 1 (HCC) - CMP14+EGFR - CBC with Differential/Platelet  3. Atherosclerosis of native coronary artery of native heart with angina pectoris (HCC) - CMP14+EGFR - CBC with Differential/Platelet  4. Pulmonary  emphysema, unspecified emphysema type ([place]) - CMP14+EGFR - CBC with Differential/Platelet  5. Depression, major, single episode, mild (HCC) - CMP14+EGFR - CBC with Differential/Platelet  6. Cardiomyopathy, unspecified type ([place]) - CMP14+EGFR - CBC with Differential/Platelet  7. Alzheimer's dementia without behavioral disturbance, unspecified timing of dementia onset ([place]) - CMP14+EGFR - CBC with Differential/Platelet  8. Hyperlipidemia, unspecified hyperlipidemia type - CMP14+EGFR - CBC with Differential/Platelet  9. At high risk for falls -Fall preventions discussed - CMP14+EGFR - CBC with Differential/Platelet   Labs pending Health Maintenance reviewed Diet and exercise encouraged  Follow up plan: 3 months and keep follow up with Cardiologists   Evelina Dun, FNP

## 2018-08-22 NOTE — Patient Instructions (Signed)

## 2018-08-23 LAB — CMP14+EGFR
A/G RATIO: 1.2 (ref 1.2–2.2)
ALK PHOS: 65 IU/L (ref 39–117)
ALT: 13 IU/L (ref 0–44)
AST: 22 IU/L (ref 0–40)
Albumin: 3.6 g/dL (ref 3.5–4.7)
BILIRUBIN TOTAL: 0.6 mg/dL (ref 0.0–1.2)
BUN/Creatinine Ratio: 14 (ref 10–24)
BUN: 14 mg/dL (ref 8–27)
CHLORIDE: 106 mmol/L (ref 96–106)
CO2: 22 mmol/L (ref 20–29)
Calcium: 9.3 mg/dL (ref 8.6–10.2)
Creatinine, Ser: 1.01 mg/dL (ref 0.76–1.27)
GFR calc non Af Amer: 69 mL/min/{1.73_m2} (ref >59)
GFR, EST AFRICAN AMERICAN: 80 mL/min/{1.73_m2} (ref >59)
GLUCOSE: 118 mg/dL — AB (ref 65–99)
Globulin, Total: 2.9 g/dL (ref 1.5–4.5)
POTASSIUM: 4.2 mmol/L (ref 3.5–5.2)
SODIUM: 145 mmol/L — AB (ref 134–144)
TOTAL PROTEIN: 6.5 g/dL (ref 6.0–8.5)

## 2018-08-23 LAB — CBC WITH DIFFERENTIAL/PLATELET
BASOS ABS: 0 10*3/uL (ref 0.0–0.2)
Basos: 0 %
EOS (ABSOLUTE): 0.2 10*3/uL (ref 0.0–0.4)
Eos: 3 %
Hematocrit: 33.2 % — ABNORMAL LOW (ref 37.5–51.0)
Hemoglobin: 11.3 g/dL — ABNORMAL LOW (ref 13.0–17.7)
IMMATURE GRANS (ABS): 0 10*3/uL (ref 0.0–0.1)
Immature Granulocytes: 0 %
LYMPHS: 29 %
Lymphocytes Absolute: 2 10*3/uL (ref 0.7–3.1)
MCH: 31.3 pg (ref 26.6–33.0)
MCHC: 34 g/dL (ref 31.5–35.7)
MCV: 92 fL (ref 79–97)
MONOS ABS: 0.5 10*3/uL (ref 0.1–0.9)
Monocytes: 7 %
NEUTROS ABS: 4.1 10*3/uL (ref 1.4–7.0)
Neutrophils: 61 %
PLATELETS: 141 10*3/uL — AB (ref 150–450)
RBC: 3.61 x10E6/uL — ABNORMAL LOW (ref 4.14–5.80)
RDW: 13.5 % (ref 12.3–15.4)
WBC: 6.7 10*3/uL (ref 3.4–10.8)

## 2018-10-14 DEATH — deceased

## 2019-02-14 ENCOUNTER — Ambulatory Visit: Payer: Medicare Other | Admitting: Family

## 2019-04-25 ENCOUNTER — Encounter: Payer: Medicare Other | Admitting: *Deleted
